# Patient Record
Sex: Female | Born: 1975 | Race: White | Hispanic: No | Marital: Married | Smoking: Never smoker
Health system: Southern US, Community
[De-identification: ages and names within clinical notes are randomized; demographics above are authoritative.]

## PROBLEM LIST (undated history)

## (undated) DIAGNOSIS — M1990 Unspecified osteoarthritis, unspecified site: Secondary | ICD-10-CM

## (undated) DIAGNOSIS — J329 Chronic sinusitis, unspecified: Secondary | ICD-10-CM

## (undated) DIAGNOSIS — G43909 Migraine, unspecified, not intractable, without status migrainosus: Secondary | ICD-10-CM

## (undated) DIAGNOSIS — Z9289 Personal history of other medical treatment: Secondary | ICD-10-CM

## (undated) DIAGNOSIS — M542 Cervicalgia: Secondary | ICD-10-CM

## (undated) DIAGNOSIS — K219 Gastro-esophageal reflux disease without esophagitis: Secondary | ICD-10-CM

## (undated) DIAGNOSIS — F329 Major depressive disorder, single episode, unspecified: Secondary | ICD-10-CM

## (undated) DIAGNOSIS — G4730 Sleep apnea, unspecified: Secondary | ICD-10-CM

## (undated) DIAGNOSIS — G8929 Other chronic pain: Secondary | ICD-10-CM

## (undated) DIAGNOSIS — M2031 Hallux varus (acquired), right foot: Secondary | ICD-10-CM

## (undated) DIAGNOSIS — R51 Headache: Secondary | ICD-10-CM

## (undated) DIAGNOSIS — F32A Depression, unspecified: Secondary | ICD-10-CM

## (undated) DIAGNOSIS — F419 Anxiety disorder, unspecified: Secondary | ICD-10-CM

## (undated) DIAGNOSIS — Z9889 Other specified postprocedural states: Secondary | ICD-10-CM

## (undated) DIAGNOSIS — M549 Dorsalgia, unspecified: Secondary | ICD-10-CM

## (undated) DIAGNOSIS — G709 Myoneural disorder, unspecified: Secondary | ICD-10-CM

## (undated) DIAGNOSIS — J189 Pneumonia, unspecified organism: Secondary | ICD-10-CM

## (undated) DIAGNOSIS — N809 Endometriosis, unspecified: Secondary | ICD-10-CM

## (undated) DIAGNOSIS — A159 Respiratory tuberculosis unspecified: Secondary | ICD-10-CM

## (undated) DIAGNOSIS — R112 Nausea with vomiting, unspecified: Secondary | ICD-10-CM

## (undated) HISTORY — DX: Migraine, unspecified, not intractable, without status migrainosus: G43.909

## (undated) HISTORY — DX: Chronic sinusitis, unspecified: J32.9

## (undated) HISTORY — DX: Other chronic pain: G89.29

## (undated) HISTORY — DX: Endometriosis, unspecified: N80.9

## (undated) HISTORY — PX: KNEE ARTHROSCOPY: SUR90

## (undated) HISTORY — PX: SLEEVE GASTROPLASTY: SHX1101

## (undated) HISTORY — PX: TYMPANOSTOMY TUBE PLACEMENT: SHX32

## (undated) HISTORY — DX: Dorsalgia, unspecified: M54.9

## (undated) HISTORY — PX: TONSILLECTOMY: SUR1361

---
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
## 1996-09-26 DIAGNOSIS — A159 Respiratory tuberculosis unspecified: Secondary | ICD-10-CM

## 1996-09-26 HISTORY — DX: Respiratory tuberculosis unspecified: A15.9

## 1998-01-21 ENCOUNTER — Emergency Department (HOSPITAL_COMMUNITY): Admission: EM | Admit: 1998-01-21 | Discharge: 1998-01-21 | Payer: Self-pay | Admitting: Emergency Medicine

## 1999-09-14 ENCOUNTER — Other Ambulatory Visit: Admission: RE | Admit: 1999-09-14 | Discharge: 1999-09-14 | Payer: Self-pay | Admitting: Obstetrics and Gynecology

## 2000-01-08 ENCOUNTER — Ambulatory Visit (HOSPITAL_COMMUNITY): Admission: RE | Admit: 2000-01-08 | Discharge: 2000-01-08 | Payer: Self-pay | Admitting: Family Medicine

## 2000-01-08 ENCOUNTER — Encounter: Payer: Self-pay | Admitting: Family Medicine

## 2000-02-14 ENCOUNTER — Ambulatory Visit (HOSPITAL_BASED_OUTPATIENT_CLINIC_OR_DEPARTMENT_OTHER): Admission: RE | Admit: 2000-02-14 | Discharge: 2000-02-14 | Payer: Self-pay | Admitting: *Deleted

## 2000-08-10 ENCOUNTER — Encounter (INDEPENDENT_AMBULATORY_CARE_PROVIDER_SITE_OTHER): Payer: Self-pay | Admitting: Specialist

## 2000-08-10 ENCOUNTER — Ambulatory Visit (HOSPITAL_BASED_OUTPATIENT_CLINIC_OR_DEPARTMENT_OTHER): Admission: RE | Admit: 2000-08-10 | Discharge: 2000-08-10 | Payer: Self-pay | Admitting: *Deleted

## 2000-09-27 ENCOUNTER — Other Ambulatory Visit: Admission: RE | Admit: 2000-09-27 | Discharge: 2000-09-27 | Payer: Self-pay | Admitting: Obstetrics and Gynecology

## 2001-09-27 ENCOUNTER — Other Ambulatory Visit: Admission: RE | Admit: 2001-09-27 | Discharge: 2001-09-27 | Payer: Self-pay | Admitting: Obstetrics and Gynecology

## 2002-05-29 ENCOUNTER — Inpatient Hospital Stay (HOSPITAL_COMMUNITY): Admission: AD | Admit: 2002-05-29 | Discharge: 2002-05-29 | Payer: Self-pay | Admitting: Obstetrics and Gynecology

## 2002-07-30 ENCOUNTER — Encounter: Payer: Self-pay | Admitting: Obstetrics and Gynecology

## 2002-07-30 ENCOUNTER — Ambulatory Visit (HOSPITAL_COMMUNITY): Admission: RE | Admit: 2002-07-30 | Discharge: 2002-07-30 | Payer: Self-pay | Admitting: Obstetrics and Gynecology

## 2002-10-14 ENCOUNTER — Encounter: Payer: Self-pay | Admitting: Obstetrics and Gynecology

## 2002-10-14 ENCOUNTER — Inpatient Hospital Stay (HOSPITAL_COMMUNITY): Admission: AD | Admit: 2002-10-14 | Discharge: 2002-10-14 | Payer: Self-pay | Admitting: Obstetrics and Gynecology

## 2002-11-08 ENCOUNTER — Encounter: Payer: Self-pay | Admitting: Obstetrics and Gynecology

## 2002-11-08 ENCOUNTER — Ambulatory Visit (HOSPITAL_COMMUNITY): Admission: RE | Admit: 2002-11-08 | Discharge: 2002-11-08 | Payer: Self-pay | Admitting: Obstetrics and Gynecology

## 2003-01-08 ENCOUNTER — Inpatient Hospital Stay (HOSPITAL_COMMUNITY): Admission: AD | Admit: 2003-01-08 | Discharge: 2003-01-08 | Payer: Self-pay | Admitting: Obstetrics and Gynecology

## 2003-01-09 ENCOUNTER — Inpatient Hospital Stay (HOSPITAL_COMMUNITY): Admission: AD | Admit: 2003-01-09 | Discharge: 2003-01-13 | Payer: Self-pay | Admitting: Obstetrics and Gynecology

## 2003-01-10 ENCOUNTER — Encounter (INDEPENDENT_AMBULATORY_CARE_PROVIDER_SITE_OTHER): Payer: Self-pay

## 2003-01-10 DIAGNOSIS — Z9289 Personal history of other medical treatment: Secondary | ICD-10-CM

## 2003-01-10 HISTORY — DX: Personal history of other medical treatment: Z92.89

## 2003-02-20 ENCOUNTER — Other Ambulatory Visit: Admission: RE | Admit: 2003-02-20 | Discharge: 2003-02-20 | Payer: Self-pay | Admitting: Obstetrics and Gynecology

## 2004-03-31 ENCOUNTER — Other Ambulatory Visit: Admission: RE | Admit: 2004-03-31 | Discharge: 2004-03-31 | Payer: Self-pay | Admitting: Obstetrics and Gynecology

## 2004-04-05 ENCOUNTER — Encounter: Admission: RE | Admit: 2004-04-05 | Discharge: 2004-04-05 | Payer: Self-pay | Admitting: Obstetrics and Gynecology

## 2004-08-06 ENCOUNTER — Ambulatory Visit (HOSPITAL_BASED_OUTPATIENT_CLINIC_OR_DEPARTMENT_OTHER): Admission: RE | Admit: 2004-08-06 | Discharge: 2004-08-06 | Payer: Self-pay | Admitting: Otolaryngology

## 2005-04-13 ENCOUNTER — Other Ambulatory Visit: Admission: RE | Admit: 2005-04-13 | Discharge: 2005-04-13 | Payer: Self-pay | Admitting: Obstetrics and Gynecology

## 2005-11-21 ENCOUNTER — Emergency Department (HOSPITAL_COMMUNITY): Admission: EM | Admit: 2005-11-21 | Discharge: 2005-11-21 | Payer: Self-pay | Admitting: Family Medicine

## 2006-09-12 ENCOUNTER — Ambulatory Visit (HOSPITAL_COMMUNITY): Admission: RE | Admit: 2006-09-12 | Discharge: 2006-09-12 | Payer: Self-pay | Admitting: Otolaryngology

## 2006-10-04 ENCOUNTER — Ambulatory Visit (HOSPITAL_COMMUNITY): Admission: RE | Admit: 2006-10-04 | Discharge: 2006-10-04 | Payer: Self-pay | Admitting: Obstetrics and Gynecology

## 2006-11-06 ENCOUNTER — Encounter: Admission: RE | Admit: 2006-11-06 | Discharge: 2006-11-06 | Payer: Self-pay | Admitting: Family Medicine

## 2007-10-12 ENCOUNTER — Ambulatory Visit (HOSPITAL_COMMUNITY): Admission: RE | Admit: 2007-10-12 | Discharge: 2007-10-12 | Payer: Self-pay | Admitting: Otolaryngology

## 2011-02-08 NOTE — Op Note (Signed)
[name], Tina Hunt NO.:  [id number]   MEDICAL RECORD NO.:  [id number]          PATIENT TYPE:  AMB   LOCATION:  SDS                          FACILITY:  [facility]   PHYSICIAN:  Lucky Cowboy, MD         DATE OF BIRTH:  December 24, 1975   DATE OF PROCEDURE:  10/12/2007  DATE OF DISCHARGE:                               OPERATIVE REPORT   PREOPERATIVE DIAGNOSIS:  Chronic bilateral otitis media.   POSTOPERATIVE DIAGNOSIS:  Chronic bilateral otitis media.   PROCEDURES:  1. Repositioning of left T-tube.  2. Removal of right middle ear T-tube.  3. Placement of new T-tube.   SURGEON:  Lucky Cowboy, MD   ANESTHESIA:  General.   ESTIMATED BLOOD LOSS:  None.   COMPLICATIONS:  None.   INDICATIONS:  The patient is a 35 year old female who has had lifelong  chronic middle ear disease.  She requires bilateral T-tubes for middle  ear aeration.  Recent examination in the office revealed the left T-tube  to be in good position.  However, it was rotated so that the lateral  lumen was against the anterior canal wall.  The right T-tube was laying  behind the tympanic membrane in the middle ear space.  For these reasons  and associated granulation tissue on the right side, the above  procedures are performed.   PROCEDURE:  The patient was taken to the operating room and placed on  the table in the supine position.  She was then placed under general  endotracheal anesthesia and a #6 ear speculum placed into the left  external auditory canal.  With the aid of the operating microscope, a an  alligator forceps was then used to reposition the pipe of the T-tube so  that it was directed in line with the ear canal.  The middle ear mucosa  can be well-seen.  Attention was then turned to the right ear.  The  existing T-tube had a small opening at the luminal lateral end there was  exposed through the tympanic membrane.  The rest of the tube was in the  middle ear space.  A right-angle hook was  used to rotate the luminal  opening anteriorly.  The pipe of the tube was then grasped with  alligator clamps and removed.  A mucoid  fluid was removed from the middle ear space.  A new Richards 1.32-mm ID  tube was then placed through the tympanic membrane and secured in place  with a pick.  Ciprodex otic was instilled.  The patient was awakened  from anesthesia and taken to the Post Anesthesia Care Unit in stable  condition.  There were no complications.      Lucky Cowboy, MD  Electronically Signed     SJ/MEDQ  D:  10/12/2007  T:  10/12/2007  Job:  [id number]   cc:   Quita Skye. Artis Flock, M.D.  [facility]

## 2011-02-11 NOTE — H&P (Signed)
Tina Hunt, Tina Hunt                       ACCOUNT NO.:  [id number]   MEDICAL RECORD NO.:  [id number]                   PATIENT TYPE:  INP   LOCATION:  9163                                 [facility]   PHYSICIAN:  Crist Fat. Rivard, M.D.              DATE OF BIRTH:  24-Apr-1976   DATE OF ADMISSION:  01/09/2003  DATE OF DISCHARGE:                                HISTORY & PHYSICAL   HISTORY OF PRESENT ILLNESS:  The patient is a 35 year old married white  female, gravida 1, para 0, at 40-5/7 weeks who presents complaining of  leaking clear fluid at around 8:15 p.m. and the onset of uterine  contractions every three to four minutes since then.  She denies any nausea,  vomiting, headaches, or visual disturbances.  Her pregnancy has been  followed at [facility] by the certified nurse midwife service,  has been essentially uncomplicated, though at risk for obesity, Rh negative,  penicillin allergy.  Group B Strep is negative.  She desires an epidural for  labor.   PAST OBSTETRICAL AND GYNECOLOGICAL HISTORY:  She is a gravida 1, para 0,  with a last menstrual period of 03/29/02, giving her an EDC of 01/03/03,  confirmed by ultrasound.  Other GYN history is noncontributory.   ALLERGIES:  PENICILLIN, it gives her hives.   PAST MEDICAL HISTORY:  1. She reports having the usual childhood diseases.  2. History of a positive PPD with a negative chest x-ray during this     pregnancy.  3. She has had occasional urinary tract infections.   PAST SURGICAL HISTORY:  1. Tonsillectomy in fourth grade.  2. Tubes in ears in first grade.  3. Left knee surgery in seventh grade.  4. Adenoidectomy in 2001.  5. She has a T-tube in her right ear now.   FAMILY HISTORY:  Significant for paternal grandfather with myocardial  infarction and heart disease and hypertension.  Mother's side of family with  varicose veins.  Maternal grandmother with anemia.  Maternal grandfather  with lung  cancer.  Maternal grandmother with breast cancer.   GENETIC HISTORY:  Negative.   SOCIAL HISTORY:  She is married to [employer] who is involved and  supportive.  They are both employed full-time.  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  They are of the [facility] faith.  They deny any illicit drug  use, alcohol, or smoking with this pregnancy.   LABORATORY DATA:  Her blood type is O negative, antibody screen is negative.  Toxoplasmosis titers are negative.  Syphilis is nonreactive.  Rubella is  immune.  Hepatitis B surface antigen is negative.  HIV is negative.  Cystic  fibrosis is negative.  Pap is within normal limits.  Her one hour Glucola  was 104.  Her Group B Strep was negative.  Gonorrhea and Chlamydia were  negative.   PHYSICAL EXAMINATION:  VITAL SIGNS:  Stable, she is afebrile.  HEENT:  Grossly within normal limits.  HEART:  Regular rate and rhythm.  CHEST:  Clear.  BREASTS:  Soft and nontender.  ABDOMEN:  Gravid with uterine contractions every three to four minutes.  The  fetal heart rate is reactive.  PELVIC:  Her cervix is 1 to 2 cm, 80%, vertex, -2, with copious amounts of  clear fluid noted.  EXTREMITIES:  Within normal limits.   ASSESSMENT:  1. Intrauterine pregnancy at 40-5/7 weeks.  2. Spontaneous rupture of membranes today at 8:15 p.m. with clear fluid.  3. Early labor.  4. Obesity.  5. Negative Group B Strep.   PLAN:  Admit to labor and delivery, follow routine CNM orders, and Dr.  Estanislado Pandy has been notified of the patient's admission.     Concha Pyo. Duplantis, C.N.M.              Crist Fat Rivard, M.D.    SJD/MEDQ  D:  01/09/2003  T:  01/10/2003  Job:  [id number]

## 2011-02-11 NOTE — Op Note (Signed)
Tina Hunt, Tina Hunt NO.:  [id number]   MEDICAL RECORD NO.:  [id number]          PATIENT TYPE:  AMB   LOCATION:  DSC                          FACILITY:  [facility]   PHYSICIAN:  Lucky Cowboy, MD         DATE OF BIRTH:  10/19/1975   DATE OF PROCEDURE:  08/06/2004  DATE OF DISCHARGE:  08/06/2004                                 OPERATIVE REPORT   PREOPERATIVE DIAGNOSIS:  Chronic otitis media.   POSTOPERATIVE DIAGNOSIS:  Chronic otitis media.   PROCEDURES:  Bilateral myringotomy with tube placement.   SURGEON:  Lucky Cowboy, M.D.   ANESTHESIA:  General mask anesthesia.   ESTIMATED BLOOD LOSS:  None.   COMPLICATIONS:  None.   INDICATIONS:  The patient is a 35 year old female who has undergone multiple  sets of tympanotomy with tube placement, including T-type tubes.  She cannot  tolerate this in the office and for this reason, she was placed under  general anesthesia.  Additionally, the patient has had conductive hearing  losses noted in the office, chronic ear pain and ear fluid.   FINDINGS:  The patient was noted to have mucoid bilateral middle ear fluid  with occluded T-type tympanotomy tubes.   DESCRIPTION OF PROCEDURE:  The patient was taken to the operating room and  placed on the table in the supine position.  She was then placed under  general endotracheal anesthesia and a #4 ear speculum placed into the right  external auditory canal.  With the aid of the operating microscope, cerumen  was removed with the curet and suction.  A myringotomy knife was used to  make an incision in the anterior inferior quadrant after removing the  existing T-type tube.  Middle ear fluid was evacuated and a 1.1 4 mm T-type  tube placed through the tympanic membrane and secured in place with a pick.  Ciprodex otic drops were instilled.  Attention was then turned to the left  ear.  In a similar fashion, the existing T-type tube was removed.  Cerumen  was debrided as well.  A  myringotomy knife was used to make an incision in  the anteroinferior quadrant.  A T-type tube with internal  diameter 1.14 mm was then placed through the tympanic membrane and secured  in place with the pick.  Ciprodex otic was instilled.  The patient was  awaken from anesthesia and taken to the postanesthesia care unit in stable  condition.  There were no complications.      [name]   SJ/MEDQ  D:  08/27/2004  T:  08/28/2004  Job:  [id number]   cc:   [facility]

## 2011-02-11 NOTE — Op Note (Signed)
Tina Hunt, KLUNK             ACCOUNT NO.:  [id number]   MEDICAL RECORD NO.:  [id number]          PATIENT TYPE:  AMB   LOCATION:  SDS                          FACILITY:  [facility]   PHYSICIAN:  Lucky Cowboy, MD         DATE OF BIRTH:  [birth year]   DATE OF PROCEDURE:  09/12/2006  DATE OF DISCHARGE:                               OPERATIVE REPORT   PREOPERATIVE DIAGNOSIS:  Chronic otitis media   POSTOPERATIVE DIAGNOSIS:  Chronic otitis media   PROCEDURE:  Bilateral myringotomy with tube placement, removal of right  middle ear tube, placement of bilateral myringotomy tubes.   SURGEON:  Lucky Cowboy, MD   ANESTHESIA:  General.   ESTIMATED BLOOD LOSS:  None.   COMPLICATIONS:  None.   INDICATIONS:  This patient is a 35 year old female with chronic otitis  media.  She has been noted to have left ear pain with a retained right  middle ear tube.  For these reasons, the above procedures are performed.   FINDINGS:  The patient was noted to have a partial tympanic membrane  perforation around the posterior marginal portion of the posteroinferior  quadrant of the left tympanic membrane.  There was no evidence of  cholesteatoma.  There was a moderate amount of middle ear mucosal edema.  The right middle ear contained a Richards T-tube in the anterosuperior  quadrant with an intact tympanic membrane.  Richards 1.32-mm ID tubes  were placed bilaterally.   PROCEDURE:  The patient was taken to the operating room and placed on  the table in the supine position.  She was then placed under general  mask anesthesia and a #4 ear speculum placed into the right external  auditory canal.  With the aid of the operating microscope, cerumen was  removed.  A myringotomy knife was used to make an incision in the  anteroinferior quadrant.  A right-angle clamp was used to grasp the T-  tube, which was then removed using alligator forceps.  A Richards T-tube  was then placed through the tympanic membrane and  secured in place with  the pick.  Ciprodex otic was instilled.  Attention was then turned to  the left ear.  In a similar fashion, cerumen was removed.  The existing  T-tube, which had cerumen debris, was removed using alligator forceps.  A myringotomy knife was used to extend the perforation toward the  anterior quadrant.  Scar bands were disrupted.  A Richards T-  tube was then placed through the tympanic membrane and secured in place  with a pick.  Ciprodex otic was instilled.  The patient was then  awakened from the anesthesia and taken to the postanesthesia care unit  in stable condition.  There were no complications.      Lucky Cowboy, MD  Electronically Signed     SJ/MEDQ  D:  09/12/2006  T:  09/13/2006  Job:  [id number]   cc:   Ladora Daniel, [facility]

## 2011-02-11 NOTE — Op Note (Signed)
NAMESERAYA, JOBST                       ACCOUNT NO.:  [id number]   MEDICAL RECORD NO.:  [id number]                   PATIENT TYPE:  INP   LOCATION:  9147                                 [facility]ILITY[facility]   PHYSICIAN:  Osborn Coho, M.D.                DATE OF BIRTH:  02/03/1976   DATE OF PROCEDURE:  01/10/2003  DATE OF DISCHARGE:                                 OPERATIVE REPORT   PREOPERATIVE DIAGNOSES:  1. Term intrauterine pregnancy.  2. Failure of descent.  3. Chorioamnionitis.   POSTOPERATIVE DIAGNOSES:  1. Term intrauterine pregnancy.  2. Failure of descent.  3. Chorioamnionitis.   PROCEDURE:  Primary low transverse cesarean section via Pfannenstiel skin  incision.   ANESTHESIA:  Epidural.   SURGEON:  Osborn Coho, M.D.   ASSISTANT:  Renaldo Reel. Emilee Hero, C.N.M.   FLUIDS REPLACED:  1900 mL.   URINE OUTPUT:  300 mL.   ESTIMATED BLOOD LOSS:  700 mL.   COMPLICATIONS:  None.   FINDINGS:  A live female infant with Apgars of 9 at one minute and 9 at five  minutes, Maxton Thomas.  Normal bilateral ovaries, normal bilateral tubes,  left tube with approximately 1 cm paratubal cyst.   DESCRIPTION OF PROCEDURE:  The patient was taken to the operating room after  the risks, benefits, and alternatives were discussed with the patient.  The  patient verbalized understanding and consent reaffirmed.  The patient was  given a surgical level via the epidural and prepped and draped in the normal  sterile fashion.  The patient was in the dorsal supine position with a  leftward tilt, and the pannus was elevated with tape.  A Pfannenstiel skin  incision was made and carried down to the underlying layer of fascia with  the Bovie.  The Bovie was used to excise the fascia bilaterally, which was  then extended with the Mayo scissors bilaterally.  Straight Kocher clamps  were placed on the inferior aspect of the fascial incision and the rectus  muscle excised from the fascia.  The  same was done on the superior aspect of  the fascial incision, and the rectus muscle was excised from the fascia.  The muscle was separated in the midline bluntly and the peritoneum entered  bluntly.  The peritoneum was manually extended.  A bladder blade was placed.  A bladder flap was created with the Metzenbaum scissors and blunt  dissection.  The uterine incision was made and extended bilaterally with the  bandage scissors.  A low transverse incision on the uterus was made.  The  infant was delivered in ROT presentation.  The infant's head upon delivery  was bulb-suctioned via the oropharynx and nasopharynx.  The remainder of the  infant was delivered and the cord was clamped and cut.  The infant was  handed off to the waiting pediatricians.  The placenta was delivered via  fundal massage.  The  uterus was cleared of all clots and debris.  The uterus  was repaired with 0 Vicryl in a running locked fashion.  A second  imbricating layer was performed.  The bladder flap was repaired with 3-0  chromic in a running fashion.  The intra-abdominal cavity was copiously  irrigated and the adnexa were identified with the findings as above.  The  peritoneum was closed with 0 chromic in a running fashion.  After the  peritoneum was closed, the muscle was also reapproximated with 2-0 chromic  using two interrupted stitches.  The fascia was repaired with 0 Vicryl in a  running fashion.  The subcutaneous tissue was irrigated and made hemostatic  with the Bovie.  A JP drain was placed.  Plain 2-0 was used to reapproximate  the subcutaneous tissue.  The skin was closed with staples.  Sponge, lap,  and needle count was correct.  The patient tolerated the procedure well and  was returned to the recovery room in stable condition.                                               Osborn Coho, M.D.    AR/MEDQ  D:  01/10/2003  T:  01/11/2003  Job:  [id number]

## 2011-02-11 NOTE — Op Note (Signed)
[place]  Patient:    Tina Hunt, Tina Hunt                    MRN: [id number] Proc. Date: 02/14/00 Adm. Date:  [id number] Disc. 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  POSTOPERATIVE DIAGNOSIS:  Otorrhagia left tympanic membrane with central perforation.  OPERATION PERFORMED:  Removal of T-tube, cautery and paper patch.  SURGEON:  Robert L. Lyman Bishop, M.D.  ANESTHESIA:  General.  INDICATIONS FOR PROCEDURE:  This patient has had chronic eustachian tube obstruction with chronic secretory otitis media.  She had had previous T-tubes placed, the one on the right has spontaneously extruded.  The drum was intact, scarred, no middle ear fluid.  On the left she had recently had bleeding from the left ear with pain and this was brought under control with Cortisporin drops and removal of a large ball of granulation tissue.  On examination she had a small residual ball of granulation tissue and a T-tube still present with a dry blood clot around it.  The patient was admitted for removal of T-tube, cautery and paper patch.  DESCRIPTION OF PROCEDURE:  After satisfactory general endotracheal anesthesia had been induced, using the operative microscope, the T-tube on the left side was removed which removed the dry blood clot along with it.  Examination showed a moderately large central perforation with well-healed margins.  Small bud of granulation tissue posteriorly that was cauterized with trichloracetic acid.  The margin of the perforation was lightly cauterized with trichloracetic acid and a paper patch saturated with Cortisporin suspension was placed over the defect and pledgets of Gelfoam also saturated with Cortisporin suspension and were placed over this.  Examination of the right ear again showed an intact  scarred atrophic drum without middle ear fluid. There was no bleeding.  The patient tolerated the procedure well, was awakened from anesthesia and taken to the recovery room in satisfactory condition. DD:  02/14/00 TD:  02/17/00 Job: [id number]

## 2011-02-11 NOTE — Op Note (Signed)
[place]  Patient:    Tina Hunt, Tina Hunt                    MRN: [id number] Proc. Date: 08/10/00 Adm. Date:  [id number] Attending:  Claudina Lick                           Operative Report  PREOPERATIVE DIAGNOSES: 1. Chronic serous otitis media. 2. Adenoid hypertrophy.  POSTOPERATIVE DIAGNOSES: 1. Chronic serous otitis media. 2. Adenoid hypertrophy.  OPERATION: 1. Bilateral T-tube myringotomies. 2. Adenoidectomy.  SURGEON:  Robert L. Lyman Bishop, M.D.  ANESTHESIA:  General.  INDICATIONS:  This 35 year old white female has had a history of chronic recurring ear infections with chronic eustachian tube dysfunction since early childhood.  The patient has had PE tube myringotomies for years.  The tubes were recently removed, so the patient could go swimming on her honeymoon, but she has subsequently developed blockage with ear middle fluid and decreased hearing.  An examination in the office in addition to scarred atrophic retracted drums with middle ear fluid on the left, showed some residual lymphoid tissue in the posterior wall of the nasopharynx, particularly on the left side around the eustachian pillar.  The patient is admitted for surgery.  DESCRIPTION OF PROCEDURE:  After satisfactory general endotracheal anesthesia had been induced, a Jennings mouth gag was inserted.  With the patients head hyperextended, the soft palate was retracted.  I could not see the full extent of the nasopharynx especially superiorly, but with palpation, the lymphoid tissue was identified and carefully removed with reverse cutting angled curet. Bleeding was controlled with a pack.  Using the operative microscope, bilateral radial myringotomy incisions were made in the anterior inferior quadrant of each ear drum.  A small amount of clear seromucoid fluid was evacuated on the left side, and modified Silastic T-tubes were inserted. Cortisporin  drops were instilled.  The pack was removed from the nasopharynx and there was no evidence of bleeding.  The estimated blood loss was 5-10 cc. The patient tolerated the procedures well, and was awakened from anesthesia, and was taken to the recovery room in satisfactory condition. DD:  08/10/00 TD:  08/10/00 Job: [id number]

## 2011-02-11 NOTE — Discharge Summary (Signed)
Tina Hunt, SCHNETZER                       ACCOUNT NO.:  [id number]   MEDICAL RECORD NO.:  [id number]                   PATIENT TYPE:  INP   LOCATION:  9147                                 [facility]   PHYSICIAN:  Tina Fat. Hunt, M.D.              DATE OF BIRTH:  [birth year]   DATE OF ADMISSION:  01/09/2003  DATE OF DISCHARGE:  01/13/2003                                 DISCHARGE SUMMARY   ADMITTING DIAGNOSES:  1. Intrauterine pregnancy at 40 and 5/7 weeks.  2. Early labor.  3. Obesity.  4. Rh negative.   DISCHARGE DIAGNOSES:  1. Term intrauterine pregnancy.  2. Failure of descent.  3. Chorioamnionitis.   HOSPITAL COURSE:  The patient is a 35 year old gravida 1, para 0 at 53 and  5/7 weeks who was admitted in early labor on the evening of January 09, 2003.  Her membranes had ruptured spontaneously.  Fluid was clear and she was 1-2  cm.  Her pregnancy had been remarkable for obesity, Rh negative, penicillin  allergy.  The patient had an epidural placed approximately 5 a.m. on the  morning of April 16.  At that time she was 3 cm, 90%.  Pitocin was begun to  augment labor.  IUPC and scalp leads were placed to facilitate tracing.  By  10 a.m. [name] were approximately 180.  Temperature maximum was 101.2.  She was begun on clindamycin and had been given a Tylenol suppository.  Fetal heart rate was reassuring.  The patient progressed to fully dilated at  approximately 12:30 p.m. at which time she began to push secondary to active  urge.  She pushed for approximately one hour and 30 minutes with vertex  remaining at 0 station.  There was slight caput beginning to form.  Fetal  heart baseline was in the 160s-170s with good variability.  Temperature was  100.5 at that time.  Tina Hunt, M.D. was consulted and the decision was  made to plan for cesarean section secondary to failure to descend.  The  patient was taken to the operating room where a primary low transverse  cesarean section was performed by Tina Hunt, M.D. under epidural  anesthesia.  Findings were a viable female by the name of Tina Hunt.  Apgars were 9 and 9.  Weight was 7 pounds 6 ounces.  There was a small left  peritubular cyst noted on the ovary.  The patient began to defervesce soon  after delivery.  She did receive one additional dose of ampicillin and  gentamicin postpartum.  By postoperative day one she was doing well.  Her  Jackson-Pratt drain was draining a small amount of fluid.  Her WBC count was  20.8 up from 18.4.  Her hemoglobin was 11.4.  Platelets were 160,000.  She  was breast-feeding and had elected to defer contraceptive decision until six  weeks.  Her Jackson-Pratt drain was discontinued on the 18th without  difficulty.  By postoperative day three she was up at lib.  She was  tolerating a regular diet.  Her incision was clean and intact, although the  area of the panniculus was slightly moist, there was no evidence of  incisional drainage.  It appeared to be more perspiration.  The staples were  intact.  The rest of the examination was within normal limits.  Infant was  doing well.  A decision was made to discharge the patient home, but to  maintain her staples for several additional days with removal in the office.  The patient was deemed to have received full benefit of her hospital stay  and was discharged home.   DISCHARGE INSTRUCTIONS:  Per [facility] handout.   DISCHARGE MEDICATIONS:  1. Motrin 600 mg p.o. q.6h. p.r.n. pain.  2. Percocet 5/325 take one p.o. q.3-4h. p.r.n. pain.  3. Prenatal vitamins one p.o. daily.   DISCHARGE FOLLOWUP:  Will occur within the week at [facility] for  staple removal and six weeks postpartum or p.r.n.     Tina Hunt, C.N.M.                   Tina Hunt, M.D.    Tina Hunt  D:  01/13/2003  T:  01/13/2003  Job:  [id number]

## 2011-06-16 LAB — HCG, SERUM, QUALITATIVE: Preg, Serum: NEGATIVE

## 2011-06-16 LAB — PROTIME-INR
INR: 0.9
Prothrombin Time: 12.8

## 2011-06-16 LAB — CBC
HCT: 39.7
Hemoglobin: 13.6
MCHC: 34.3
MCV: 92.2
Platelets: 264
RBC: 4.3
RDW: 13.3
WBC: 8.7

## 2011-06-16 LAB — BASIC METABOLIC PANEL
BUN: 11
CO2: 23
Calcium: 8.9
Chloride: 105
Creatinine, Ser: 0.6
GFR calc Af Amer: >60
GFR calc non Af Amer: >60
Glucose, Bld: 87
Potassium: 3.8
Sodium: 135

## 2011-06-16 LAB — APTT: aPTT: 30

## 2011-11-10 ENCOUNTER — Other Ambulatory Visit (HOSPITAL_COMMUNITY): Payer: Self-pay | Admitting: Otolaryngology

## 2011-11-18 ENCOUNTER — Encounter (HOSPITAL_COMMUNITY)
Admission: RE | Admit: 2011-11-18 | Discharge: 2011-11-18 | Disposition: A | Discharge status: Home / Self Care | Payer: BC Managed Care – PPO | Source: Ambulatory Visit | Attending: Otolaryngology | Admitting: Otolaryngology

## 2011-11-18 ENCOUNTER — Encounter (HOSPITAL_COMMUNITY): Payer: Self-pay | Admitting: Pharmacy Technician

## 2011-11-18 ENCOUNTER — Encounter (HOSPITAL_COMMUNITY): Payer: Self-pay

## 2011-11-18 HISTORY — DX: Anxiety disorder, unspecified: F41.9

## 2011-11-18 HISTORY — DX: Depression, unspecified: F32.A

## 2011-11-18 HISTORY — DX: Headache: R51

## 2011-11-18 HISTORY — DX: Other specified postprocedural states: R11.2

## 2011-11-18 HISTORY — DX: Major depressive disorder, single episode, unspecified: F32.9

## 2011-11-18 HISTORY — DX: Other specified postprocedural states: Z98.890

## 2011-11-18 HISTORY — DX: Sleep apnea, unspecified: G47.30

## 2011-11-18 HISTORY — DX: Myoneural disorder, unspecified: G70.9

## 2011-11-18 HISTORY — DX: Respiratory tuberculosis unspecified: A15.9

## 2011-11-18 LAB — CBC
HCT: 41.6 % (ref 36.0–46.0)
Hemoglobin: 14.4 g/dL (ref 12.0–15.0)
MCH: 31.1 pg (ref 26.0–34.0)
MCHC: 34.6 g/dL (ref 30.0–36.0)
MCV: 89.8 fL (ref 78.0–100.0)
Platelets: 277 10*3/uL (ref 150–400)
RBC: 4.63 MIL/uL (ref 3.87–5.11)
RDW: 13 % (ref 11.5–15.5)
WBC: 11.5 10*3/uL — ABNORMAL HIGH (ref 4.0–10.5)

## 2011-11-18 LAB — HCG, SERUM, QUALITATIVE: Preg, Serum: NEGATIVE

## 2011-11-18 LAB — BASIC METABOLIC PANEL
BUN: 13 mg/dL (ref 6–23)
CO2: 23 mEq/L (ref 19–32)
Calcium: 9.9 mg/dL (ref 8.4–10.5)
Chloride: 102 mEq/L (ref 96–112)
Creatinine, Ser: 0.65 mg/dL (ref 0.50–1.10)
GFR calc Af Amer: >90 mL/min (ref >90)
GFR calc non Af Amer: >90 mL/min (ref >90)
Glucose, Bld: 90 mg/dL (ref 70–99)
Potassium: 3.9 mEq/L (ref 3.5–5.1)
Sodium: 136 mEq/L (ref 135–145)

## 2011-11-18 LAB — SURGICAL PCR SCREEN
MRSA, PCR: POSITIVE — AB
Staphylococcus aureus: POSITIVE — AB

## 2011-11-18 NOTE — Pre-Procedure Instructions (Signed)
19 Tina Hunt  11/18/2011   Your procedure is scheduled on: Monday 11/28/11    Report to [facility] at 530 AM.  Call this number if you have problems the morning of surgery: [phone]   Remember:   Do not eat food:After Midnight.  May have clear liquids: up to 4 Hours before arrival.  Clear liquids include soda, tea, black coffee, apple or grape juice, broth.  Take these medicines the morning of surgery with A SIP OF WATER: PRISTIQ, ZONEGRAN    Do not wear jewelry, make-up or nail polish.  Do not wear lotions, powders, or perfumes. You may wear deodorant.  Do not shave 48 hours prior to surgery.  Do not bring valuables to the hospital.  Contacts, dentures or bridgework may not be worn into surgery.  Leave suitcase in the car. After surgery it may be brought to your room.  For patients admitted to the hospital, checkout time is 11:00 AM the day of discharge.   Patients discharged the day of surgery will not be allowed to drive home.  Name and phone number of your driver:   Special Instructions: CHG Shower Use Special Wash: 1/2 bottle night before surgery and 1/2 bottle morning of surgery.   Please read over the following fact sheets that you were given: Pain Booklet, MRSA Information and Surgical Site Infection Prevention

## 2011-11-18 NOTE — Progress Notes (Signed)
Requested sleep study [facility]  [phone].

## 2011-11-22 NOTE — Progress Notes (Signed)
Pt called x3(twice on home phone, once @work ).  Message left on VM for pt to get Mupirocin filled along w/#to contact us re: info she would need.//L. Oaklynn Stierwalt,RN

## 2011-11-25 ENCOUNTER — Other Ambulatory Visit (HOSPITAL_COMMUNITY): Payer: Self-pay | Admitting: Otolaryngology

## 2011-11-28 ENCOUNTER — Encounter (HOSPITAL_COMMUNITY): Payer: Self-pay | Admitting: Anesthesiology

## 2011-11-28 ENCOUNTER — Ambulatory Visit (HOSPITAL_COMMUNITY): Payer: BC Managed Care – PPO | Admitting: Anesthesiology

## 2011-11-28 ENCOUNTER — Ambulatory Visit (HOSPITAL_COMMUNITY)
Admission: RE | Admit: 2011-11-28 | Discharge: 2011-11-28 | Disposition: A | Discharge status: Home / Self Care | Payer: BC Managed Care – PPO | Source: Ambulatory Visit | Attending: Otolaryngology | Admitting: Otolaryngology

## 2011-11-28 ENCOUNTER — Encounter (HOSPITAL_COMMUNITY)
Admission: RE | Disposition: A | Discharge status: Home / Self Care | Payer: Self-pay | Source: Ambulatory Visit | Attending: Otolaryngology

## 2011-11-28 DIAGNOSIS — Z01812 Encounter for preprocedural laboratory examination: Secondary | ICD-10-CM | POA: Insufficient documentation

## 2011-11-28 DIAGNOSIS — H6990 Unspecified Eustachian tube disorder, unspecified ear: Secondary | ICD-10-CM | POA: Diagnosis present

## 2011-11-28 DIAGNOSIS — H6980 Other specified disorders of Eustachian tube, unspecified ear: Secondary | ICD-10-CM | POA: Diagnosis present

## 2011-11-28 SURGERY — MYRINGOTOMY WITH TUBE PLACEMENT
Anesthesia: General | Site: Ear | Laterality: Bilateral | Wound class: Clean Contaminated

## 2011-11-28 MED ORDER — FENTANYL CITRATE 0.05 MG/ML IJ SOLN
INTRAMUSCULAR | Status: DC | PRN
  Administered 2011-11-28 (×2): 50 ug via INTRAVENOUS

## 2011-11-28 MED ORDER — PROPOFOL 10 MG/ML IV EMUL
INTRAVENOUS | Status: DC | PRN
  Administered 2011-11-28: 200 mg via INTRAVENOUS
  Administered 2011-11-28: 40 mg via INTRAVENOUS

## 2011-11-28 MED ORDER — ONDANSETRON HCL 4 MG/2ML IJ SOLN: 4.0000 mg | Freq: Four times a day (QID) | INTRAMUSCULAR | Status: DC | PRN

## 2011-11-28 MED ORDER — SUCCINYLCHOLINE CHLORIDE 20 MG/ML IJ SOLN
INTRAMUSCULAR | Status: DC | PRN
  Administered 2011-11-28: 100 mg via INTRAVENOUS

## 2011-11-28 MED ORDER — FENTANYL CITRATE 0.05 MG/ML IJ SOLN
25.0000 ug | INTRAMUSCULAR | Status: DC | PRN
  Administered 2011-11-28 (×2): 25 ug via INTRAVENOUS

## 2011-11-28 MED ORDER — MIDAZOLAM HCL 5 MG/5ML IJ SOLN
INTRAMUSCULAR | Status: DC | PRN
  Administered 2011-11-28: 2 mg via INTRAVENOUS

## 2011-11-28 MED ORDER — LACTATED RINGERS IV SOLN
INTRAVENOUS | Status: DC | PRN
  Administered 2011-11-28: 07:00:00 via INTRAVENOUS

## 2011-11-28 MED ORDER — CIPROFLOXACIN-DEXAMETHASONE 0.3-0.1 % OT SUSP
OTIC | Status: DC | PRN
  Administered 2011-11-28: 4 [drp] via OTIC

## 2011-11-28 MED ORDER — ONDANSETRON HCL 4 MG/2ML IJ SOLN
INTRAMUSCULAR | Status: DC | PRN
  Administered 2011-11-28: 4 mg via INTRAVENOUS

## 2011-11-28 SURGICAL SUPPLY — 20 items
ASP/CLT FLD ANG ADJ TUBE STRL (MISCELLANEOUS)
ASPIRATOR COLLECTOR MID EAR (MISCELLANEOUS) IMPLANT
BALL CTTN LRG ABS STRL LF (GAUZE/BANDAGES/DRESSINGS) ×1
BLADE MYRINGOTOMY 6 SPEAR HDL (BLADE) ×2 IMPLANT
CANISTER SUCTION 2500CC (MISCELLANEOUS) ×1 IMPLANT
CLOTH BEACON ORANGE TIMEOUT ST (SAFETY) ×2 IMPLANT
COTTONBALL LRG STERILE PKG (GAUZE/BANDAGES/DRESSINGS) ×2 IMPLANT
COVER MAYO STAND STRL (DRAPES) ×2 IMPLANT
CRADLE DONUT ADULT HEAD (MISCELLANEOUS) IMPLANT
DRAPE PROXIMA HALF (DRAPES) ×2 IMPLANT
GLOVE ECLIPSE 7.5 STRL STRAW (GLOVE) ×2 IMPLANT
GLOVE SURG SS PI 6.5 STRL IVOR (GLOVE) ×1 IMPLANT
KIT ROOM TURNOVER OR (KITS) ×2 IMPLANT
PAD ARMBOARD 7.5X6 YLW CONV (MISCELLANEOUS) ×4 IMPLANT
SYR BULB 3OZ (MISCELLANEOUS) IMPLANT
TOWEL OR 17X24 6PK STRL BLUE (TOWEL DISPOSABLE) ×2 IMPLANT
TUBE CONNECTING 12X1/4 (SUCTIONS) ×2 IMPLANT
TUBE EAR SHEEHY BUTTON 1.27 (OTOLOGIC RELATED) IMPLANT
TUBE EAR T MOD 1.32X4.8 BL (OTOLOGIC RELATED) ×2 IMPLANT
WATER STERILE IRR 1000ML POUR (IV SOLUTION) IMPLANT

## 2011-11-28 NOTE — Anesthesia Postprocedure Evaluation (Signed)
Anesthesia Post Note  Patient: Tina Hunt  Procedure(s) Performed: Procedure(s) (LRB): MYRINGOTOMY WITH TUBE PLACEMENT (Bilateral)  Anesthesia type: General  Patient location: PACU  Post pain: Pain level controlled and Adequate analgesia  Post assessment: Post-op Vital signs reviewed, Patient's Cardiovascular Status Stable, Respiratory Function Stable, Patent Airway and Pain level controlled  Last Vitals:  Filed Vitals:   11/28/11 0845  BP: 151/85  Pulse: 74  Temp:   Resp: 22    Post vital signs: Reviewed and stable  Level of consciousness: awake, alert  and oriented  Complications: No apparent anesthesia complications

## 2011-11-28 NOTE — Anesthesia Preprocedure Evaluation (Signed)
Anesthesia Evaluation  Patient identified by MRN, date of birth, ID band Patient awake    Reviewed: Allergy & Precautions, H&P , NPO status , Patient's Chart, lab work & pertinent test results  History of Anesthesia Complications (+) PONV  Airway Mallampati: III  Neck ROM: full    Dental   Pulmonary sleep apnea ,          Cardiovascular     Neuro/Psych  Headaches, PSYCHIATRIC DISORDERS Anxiety Depression  Neuromuscular disease    GI/Hepatic   Endo/Other  Morbid obesity  Renal/GU      Musculoskeletal   Abdominal   Peds  Hematology   Anesthesia Other Findings   Reproductive/Obstetrics                           Anesthesia Physical Anesthesia Plan  ASA: II  Anesthesia Plan: General   Post-op Pain Management:    Induction: Intravenous  Airway Management Planned: Oral ETT  Additional Equipment:   Intra-op Plan:   Post-operative Plan: Extubation in OR  Informed Consent: I have reviewed the patients History and Physical, chart, labs and discussed the procedure including the risks, benefits and alternatives for the proposed anesthesia with the patient or authorized representative who has indicated his/her understanding and acceptance.     Plan Discussed with: CRNA and Surgeon  Anesthesia Plan Comments:         Anesthesia Quick Evaluation

## 2011-11-28 NOTE — Transfer of Care (Signed)
Immediate Anesthesia Transfer of Care Note  Patient: Tina Hunt  Procedure(s) Performed: Procedure(s) (LRB): MYRINGOTOMY WITH TUBE PLACEMENT (Bilateral)  Patient Location: PACU  Anesthesia Type: General  Level of Consciousness: awake, alert  and oriented  Airway & Oxygen Therapy: Patient Spontanous Breathing and Patient connected to face mask oxygen  Post-op Assessment: Report given to PACU RN  Post vital signs: Reviewed and stable  Complications: No apparent anesthesia complications

## 2011-11-28 NOTE — H&P (Signed)
Tina Hunt is an 36 y.o. female.   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  Last set of tubes was placed in 2009 and have extruded with right one falling into the middle ear.  Presents for tube replacement due to ongoing symptoms.  Will also retrieve tube from right middle ear.  Past Medical History  Diagnosis Date  . PONV (postoperative nausea and vomiting)   . Sleep apnea     DR WHITE ORDERED  CPAP   . Tuberculosis     TB + SKIN TEST   . Headache     MIGRAINES   . Neuromuscular disorder     BLACKOUT SPELLS  NONE SINCE 1996   . Anxiety   . Depression     Past Surgical History  Procedure Date  . Tonsillectomy     ADENOID X2 (SEPARATE SURGERY)  TUBE PLACEMENT  . Tympanostomy tube placement   . Knee arthroscopy     1988   . Cesarean section     2004     No family history on file. Social History:  reports that she has never smoked. She has never used smokeless tobacco. She reports that she drinks alcohol. She reports that she does not use illicit drugs.  Allergies:  Allergies  Allergen Reactions  . Penicillins Hives    No current facility-administered medications on file as of 11/28/2011.   No current outpatient prescriptions on file as of 11/28/2011.    No results found for this or any previous visit (from the past 48 hour(s)). No results found.  Review of Systems  HENT: Positive for congestion.   Neurological: Positive for headaches.  All other systems reviewed and are negative.    Blood pressure 116/73, pulse 87, temperature 98 F (36.7 C), temperature source Oral, resp. rate 20, SpO2 98.00%. Physical Exam  Constitutional: She is oriented to person, place, and time. She appears well-developed and well-nourished.  HENT:  Head: Normocephalic and atraumatic.  Right Ear: External ear normal.  Left Ear: External ear normal.  Nose: Nose normal.  Mouth/Throat: Oropharynx is clear and moist.       Right middle ear with  T-tube foreign body behind intact TM.  Eyes: Conjunctivae and EOM are normal. Pupils are equal, round, and reactive to light.  Neck: Normal range of motion. Neck supple.  Cardiovascular: Normal rate.   Respiratory: Effort normal.  GI:       Did not examine.  Genitourinary:       Did not examine.  Musculoskeletal: Normal range of motion.  Neurological: She is alert and oriented to person, place, and time. No cranial nerve deficit.  Skin: Skin is warm and dry.  Psychiatric: She has a normal mood and affect. Her behavior is normal. Judgment and thought content normal.     Assessment/Plan ETD, Right middle ear foreign body. To OR for T-tube placement in both ears and retrieval of right middle ear foreign body.  Shane Badeaux 11/28/2011, 7:34 AM

## 2011-11-28 NOTE — Op Note (Signed)
[name], Tina Hunt NO.:  [id number]  MEDICAL RECORD NO.:  [id number]  LOCATION:  [facility]                         FACILITY:  [facility]  PHYSICIAN:  Antony Contras, MD     DATE OF BIRTH:  05-Oct-1975  DATE OF PROCEDURE:  11/28/2011 DATE OF DISCHARGE:                              OPERATIVE REPORT   PREOPERATIVE DIAGNOSES: 1. Eustachian tube dysfunction. 2. Right middle ear foreign body.  POSTOPERATIVE DIAGNOSES: 1. Eustachian tube dysfunction. 2. Right middle ear foreign body.  PROCEDURE: 1. Bilateral myringotomy with T-tube placement. 2. Removal of right middle ear foreign body.  SURGEON:  Antony Contras, MD  ANESTHESIA:  General endotracheal anesthesia.  COMPLICATIONS:  None.  INDICATION:  The patient is a 36 year old white female with a long history of significant eustachian tube dysfunction having required many sets of tympanostomy tubes over the ears.  Her last set was placed in 2009, and has since extruded with the right tube falling into the middle ear space with an intact tympanic membrane over it.  She has been symptomatic with her eustachian tube dysfunction, and presents to the operating room for replacement of T-tubes and removal of the right middle ear foreign body.  She is unable to tolerate this kind of procedure awake and presents for general anesthesia.  FINDINGS:  Tympanic membranes are both intact and somewhat retracted. The left middle ear space contained a serous effusion.  The right middle ear space contained an intact T-tube and no significant effusion.  DESCRIPTION OF PROCEDURE:  The patient identified in the holding room and informed consent having been obtained including discussion of risks, benefits, alternatives, the patient was brought to the operative suite and put on the operative table in a supine position.  Anesthesia was induced, and the patient was intubated by the anesthesia team without difficulty.  The eyes were taped  closed, and the left ear was inspected under the operating microscope using ear speculum.  Cerumen was removed from the tympanic membrane using a curette.  A vertical incision was made in the anterior-inferior quadrant using myringotomy knife and middle ear resection.  A Richards modified T-tube was then placed through the myringotomy incision and unfurled into a proper position. Ciprodex drops and a cotton ball were then added.  The right ear was then inspected.  A vertical incision was made in the anterior-inferior quadrant using myringotomy knife.  An angled hook was then placed through the myringotomy incision and used to pull part of the T-tube through the myringotomy incision.  An alligator forceps was then used to remove the foreign body.  The middle ear was suctioned and a new Richards modified T-tube was then placed through the myringotomy incision and unfurled into position.  Ciprodex drops and cotton ball were added.  The patient was then returned to anesthesia for wake-up, was extubated and moved to recovery room in stable condition.     Antony Contras, MD     DDB/MEDQ  D:  11/28/2011  T:  11/28/2011  Job:  [id number]

## 2011-11-28 NOTE — Progress Notes (Signed)
Report given to maria rn as caregiver 

## 2011-11-28 NOTE — Brief Op Note (Signed)
11/28/2011  8:12 AM  PATIENT:  Tina Hunt  36 y.o. female  PRE-OPERATIVE DIAGNOSIS: Eustachian tube dysfunction, right middle ear foreign body  POST-OPERATIVE DIAGNOSIS:  Same  PROCEDURE:  Procedure(s) (LRB): MYRINGOTOMY WITH T-TUBE PLACEMENT (Bilateral) RIGHT MIDDLE EAR FOREIGN BODY REMOVAL  SURGEON:  Surgeon(s) and Role:    * Christia Reading, MD - Primary  PHYSICIAN ASSISTANT:   ASSISTANTS: none   ANESTHESIA:   general  EBL:     BLOOD ADMINISTERED:none  DRAINS: none   LOCAL MEDICATIONS USED: NONE.  SPECIMEN:  No Specimen  DISPOSITION OF SPECIMEN:  N/A  COUNTS:  YES  TOURNIQUET:  * No tourniquets in log *  DICTATION: .Other Dictation: Dictation Number [id number]  PLAN OF CARE: Discharge to home after PACU  PATIENT DISPOSITION:  PACU - hemodynamically stable.   Delay start of Pharmacological VTE agent (>24hrs) due to surgical blood loss or risk of bleeding: no

## 2011-11-28 NOTE — Preoperative (Signed)
Beta Blockers   Reason not to administer Beta Blockers:Not Applicable 

## 2012-02-01 ENCOUNTER — Other Ambulatory Visit: Payer: Self-pay | Admitting: Otolaryngology

## 2012-02-01 DIAGNOSIS — J329 Chronic sinusitis, unspecified: Secondary | ICD-10-CM

## 2012-02-02 ENCOUNTER — Ambulatory Visit
Admission: RE | Admit: 2012-02-02 | Discharge: 2012-02-02 | Disposition: A | Discharge status: Home / Self Care | Payer: BC Managed Care – PPO | Source: Ambulatory Visit | Attending: Otolaryngology | Admitting: Otolaryngology

## 2012-02-02 DIAGNOSIS — J329 Chronic sinusitis, unspecified: Secondary | ICD-10-CM

## 2012-02-06 ENCOUNTER — Other Ambulatory Visit: Payer: BC Managed Care – PPO

## 2012-12-12 ENCOUNTER — Emergency Department (HOSPITAL_COMMUNITY)
Admission: EM | Admit: 2012-12-12 | Discharge: 2012-12-12 | Disposition: A | Discharge status: Home / Self Care | Payer: BC Managed Care – PPO | Attending: Emergency Medicine | Admitting: Emergency Medicine

## 2012-12-12 ENCOUNTER — Encounter (HOSPITAL_COMMUNITY): Payer: Self-pay | Admitting: Emergency Medicine

## 2012-12-12 DIAGNOSIS — F411 Generalized anxiety disorder: Secondary | ICD-10-CM | POA: Insufficient documentation

## 2012-12-12 DIAGNOSIS — G43909 Migraine, unspecified, not intractable, without status migrainosus: Secondary | ICD-10-CM | POA: Insufficient documentation

## 2012-12-12 DIAGNOSIS — Z3202 Encounter for pregnancy test, result negative: Secondary | ICD-10-CM | POA: Insufficient documentation

## 2012-12-12 DIAGNOSIS — F329 Major depressive disorder, single episode, unspecified: Secondary | ICD-10-CM | POA: Insufficient documentation

## 2012-12-12 DIAGNOSIS — F3289 Other specified depressive episodes: Secondary | ICD-10-CM | POA: Insufficient documentation

## 2012-12-12 DIAGNOSIS — M461 Sacroiliitis, not elsewhere classified: Secondary | ICD-10-CM

## 2012-12-12 DIAGNOSIS — G4730 Sleep apnea, unspecified: Secondary | ICD-10-CM | POA: Insufficient documentation

## 2012-12-12 DIAGNOSIS — Z8611 Personal history of tuberculosis: Secondary | ICD-10-CM | POA: Insufficient documentation

## 2012-12-12 DIAGNOSIS — Z8669 Personal history of other diseases of the nervous system and sense organs: Secondary | ICD-10-CM | POA: Insufficient documentation

## 2012-12-12 DIAGNOSIS — Z79899 Other long term (current) drug therapy: Secondary | ICD-10-CM | POA: Insufficient documentation

## 2012-12-12 HISTORY — DX: Cervicalgia: M54.2

## 2012-12-12 LAB — URINALYSIS, ROUTINE W REFLEX MICROSCOPIC
Bilirubin Urine: NEGATIVE
Glucose, UA: NEGATIVE mg/dL
Ketones, ur: NEGATIVE mg/dL
Leukocytes, UA: NEGATIVE
Nitrite: NEGATIVE
Protein, ur: NEGATIVE mg/dL
Specific Gravity, Urine: 1.023 (ref 1.005–1.030)
Urobilinogen, UA: 0.2 mg/dL (ref 0.0–1.0)
pH: 5 (ref 5.0–8.0)

## 2012-12-12 LAB — URINE MICROSCOPIC-ADD ON

## 2012-12-12 LAB — POCT PREGNANCY, URINE: Preg Test, Ur: NEGATIVE

## 2012-12-12 MED ORDER — METHOCARBAMOL 500 MG PO TABS: 500.0000 mg | ORAL_TABLET | Freq: Two times a day (BID) | ORAL | Status: DC

## 2012-12-12 MED ORDER — OXYCODONE-ACETAMINOPHEN 5-325 MG PO TABS: 1.0000 | ORAL_TABLET | Freq: Four times a day (QID) | ORAL | Status: DC | PRN

## 2012-12-12 MED ORDER — OXYCODONE-ACETAMINOPHEN 5-325 MG PO TABS
1.0000 | ORAL_TABLET | Freq: Once | ORAL | Status: AC
  Administered 2012-12-12: 1 via ORAL
  Filled 2012-12-12: qty 1

## 2012-12-12 MED ORDER — METHOCARBAMOL 500 MG PO TABS
500.0000 mg | ORAL_TABLET | Freq: Once | ORAL | Status: AC
  Administered 2012-12-12: 500 mg via ORAL
  Filled 2012-12-12: qty 1

## 2012-12-12 NOTE — ED Notes (Signed)
MD at bedside. 

## 2012-12-12 NOTE — ED Provider Notes (Signed)
History    This chart was scribed for non-physician practitioner working with Tina Razor, MD by ED Scribe, Burman Nieves. This patient was seen in room TR07C/TR07C and the patient's care was started at 8:46 PM.   CSN: [id number]  Arrival date & time 12/12/12  1800   First MD Initiated Contact with Patient 12/12/12 2046      Chief Complaint  Patient presents with  . Back Pain    (Consider location/radiation/quality/duration/timing/severity/associated sxs/prior treatment) Patient is a 37 y.o. female presenting with back pain. The history is provided by the patient. No language interpreter was used.  Back Pain Location:  Thoracic spine Quality:  Stabbing Pain severity:  Moderate Onset quality:  Sudden Progression:  Unchanged Chronicity:  Recurrent Relieved by:  Nothing Worsened by:  Lying down, sitting, palpation and ambulation Associated symptoms: no abdominal pain, no fever and no headaches   Risk factors: obesity    DHRUVI CRENSHAW is a 37 y.o. female who is obese who presents to the Emergency Department complaining of moderate constant left sided back pain onset for 2 weeks. She states in the past 48 hrs pain has increasingly gotten worse in the left side of her lower back. She tried a heating pad that did not help at all when she got out of school today. Pt took some prescribed Vicodin and otc ibuprofen yesterday with no immediate relief. Pt states the pain radiates to the left side of her buttocks.Pt states that it is more comfortable to stand, sitting and standing up seems to exacerbate sx's. Pt denies any vaginal discharge, numbness, or bladder/bowel incontinence. Pt also denies fever, chills, cough, nausea, vomiting, diarrhea, SOB, weakness, and any other associated symptoms. Pt's PCP is Dr. Cliffton Asters.   Past Medical History  Diagnosis Date  . PONV (postoperative nausea and vomiting)   . Sleep apnea     DR WHITE ORDERED  CPAP   . Tuberculosis     TB + SKIN TEST   . Headache      MIGRAINES   . Neuromuscular disorder     BLACKOUT SPELLS  NONE SINCE 1996   . Anxiety   . Depression   . Neck pain     Past Surgical History  Procedure Laterality Date  . Tonsillectomy      ADENOID X2 (SEPARATE SURGERY)  TUBE PLACEMENT  . Tympanostomy tube placement    . Knee arthroscopy      1988   . Cesarean section      2004     No family history on file.  History  Substance Use Topics  . Smoking status: Never Smoker   . Smokeless tobacco: Never Used  . Alcohol Use: Yes     Comment: OCC    OB History   Grav Para Term Preterm Abortions TAB SAB Ect Mult Living                  Review of Systems  Constitutional: Negative for fever.  Gastrointestinal: Negative for abdominal pain.  Musculoskeletal: Positive for myalgias and back pain.  Neurological: Negative for headaches.  All other systems reviewed and are negative.    Allergies  Penicillins  Home Medications   Current Outpatient Rx  Name  Route  Sig  Dispense  Refill  . acetaminophen (TYLENOL) 650 MG CR tablet   Oral   Take 650 mg by mouth 2 (two) times daily as needed for pain.         Marland Kitchen ALPRAZolam (XANAX) 0.5 MG  tablet   Oral   Take 0.5 mg by mouth at bedtime as needed for sleep.          . cyclobenzaprine (FLEXERIL) 10 MG tablet   Oral   Take 10 mg by mouth at bedtime as needed for muscle spasms.          Marland Kitchen eletriptan (RELPAX) 40 MG tablet   Oral   One tablet by mouth at onset of headache. May repeat in 2 hours if headache persists or recurs.          Marland Kitchen ibuprofen (ADVIL,MOTRIN) 200 MG tablet   Oral   Take 800 mg by mouth every 6 (six) hours as needed for pain.         Marland Kitchen levofloxacin (LEVAQUIN) 750 MG tablet   Oral   Take 750 mg by mouth daily. For 7 days; Start date 12/10/12           BP 142/89  Pulse 91  Temp(Src) 98.6 F (37 C) (Oral)  Resp 18  SpO2 97%  LMP 12/05/2012  Physical Exam  Nursing note and vitals reviewed. Constitutional: She is oriented to person,  place, and time. She appears well-developed and well-nourished. No distress.  Morbidly obese  HENT:  Head: Normocephalic and atraumatic.  Eyes: EOM are normal.  Neck: Neck supple. No tracheal deviation present.  Cardiovascular: Normal rate.   Pulmonary/Chest: Effort normal. No respiratory distress.  Musculoskeletal: Normal range of motion. She exhibits tenderness.  Tenderness to left paralumbar region without overline skin changes. Has point tenderness to left mid buttocks near sacroilliac joint without overline skin changes.  No significant midline spine tenderness, crepitus or step offs no CVA tenderness.   Neurological: She is alert and oriented to person, place, and time.  Patella deep tendon reflex negative bilaterally with no foot drop  Skin: Skin is warm and dry.  Psychiatric: She has a normal mood and affect. Her behavior is normal.    ED Course  Procedures (including critical care time) DIAGNOSTIC STUDIES: Oxygen Saturation is 97% on room air, adequate by my interpretation.    COORDINATION OF CARE: 9:01 PM Discussed ED treatment with pt and pt agrees. No red flags, doubt kidney stone or UTI.  Likely sacroillitis.  RICE therapy and ortho referral as needed.      Labs Reviewed  URINALYSIS, ROUTINE W REFLEX MICROSCOPIC - Abnormal; Notable for the following:    Hgb urine dipstick SMALL (*)    All other components within normal limits  URINE MICROSCOPIC-ADD ON - Abnormal; Notable for the following:    Squamous Epithelial / LPF MANY (*)    Bacteria, UA FEW (*)    All other components within normal limits  POCT PREGNANCY, URINE   No results found.   1. Sacroiliitis    BP 142/89  Pulse 91  Temp(Src) 98.6 F (37 C) (Oral)  Resp 18  SpO2 97%  LMP 12/05/2012  I have reviewed nursing notes and vital signs.  I reviewed available ER/hospitalization records thought the EMR    MDM  I personally performed the services described in this documentation, which was scribed  in my presence. The recorded information has been reviewed and is accurate.          Fayrene Helper, PA-C 12/12/12 2126

## 2012-12-12 NOTE — ED Notes (Signed)
C/o L lower back pain x 2 weeks.  Worse over the past couple of days. Denies urinary complaints at this time but states when symptoms first started it felt like a UTI.

## 2012-12-13 NOTE — ED Provider Notes (Signed)
Medical screening examination/treatment/procedure(s) were performed by non-physician practitioner and as supervising physician I was immediately available for consultation/collaboration.  Raeford Razor, MD 12/13/12 1421

## 2014-02-17 ENCOUNTER — Other Ambulatory Visit: Payer: Self-pay | Admitting: Orthopedic Surgery

## 2014-03-07 ENCOUNTER — Inpatient Hospital Stay (HOSPITAL_COMMUNITY): Admission: RE | Admit: 2014-03-07 | Payer: BC Managed Care – PPO | Source: Ambulatory Visit

## 2014-03-10 ENCOUNTER — Ambulatory Visit (HOSPITAL_COMMUNITY)
Admission: RE | Admit: 2014-03-10 | Discharge: 2014-03-10 | Disposition: A | Discharge status: Home / Self Care | Payer: BC Managed Care – PPO | Source: Ambulatory Visit | Attending: Anesthesiology | Admitting: Anesthesiology

## 2014-03-10 ENCOUNTER — Encounter (HOSPITAL_COMMUNITY): Payer: Self-pay

## 2014-03-10 ENCOUNTER — Encounter (HOSPITAL_COMMUNITY): Payer: Self-pay | Admitting: Pharmacy Technician

## 2014-03-10 ENCOUNTER — Encounter (INDEPENDENT_AMBULATORY_CARE_PROVIDER_SITE_OTHER): Payer: Self-pay

## 2014-03-10 ENCOUNTER — Encounter (HOSPITAL_COMMUNITY)
Admission: RE | Admit: 2014-03-10 | Discharge: 2014-03-10 | Disposition: A | Discharge status: Home / Self Care | Payer: BC Managed Care – PPO | Source: Ambulatory Visit | Attending: Orthopedic Surgery | Admitting: Orthopedic Surgery

## 2014-03-10 DIAGNOSIS — Z01818 Encounter for other preprocedural examination: Secondary | ICD-10-CM | POA: Insufficient documentation

## 2014-03-10 DIAGNOSIS — Z01812 Encounter for preprocedural laboratory examination: Secondary | ICD-10-CM | POA: Insufficient documentation

## 2014-03-10 DIAGNOSIS — Z01810 Encounter for preprocedural cardiovascular examination: Secondary | ICD-10-CM | POA: Insufficient documentation

## 2014-03-10 HISTORY — DX: Unspecified osteoarthritis, unspecified site: M19.90

## 2014-03-10 HISTORY — DX: Gastro-esophageal reflux disease without esophagitis: K21.9

## 2014-03-10 HISTORY — DX: Pneumonia, unspecified organism: J18.9

## 2014-03-10 LAB — CBC
HCT: 38.9 % (ref 36.0–46.0)
Hemoglobin: 13.1 g/dL (ref 12.0–15.0)
MCH: 30.5 pg (ref 26.0–34.0)
MCHC: 33.7 g/dL (ref 30.0–36.0)
MCV: 90.5 fL (ref 78.0–100.0)
Platelets: 236 10*3/uL (ref 150–400)
RBC: 4.3 MIL/uL (ref 3.87–5.11)
RDW: 12.9 % (ref 11.5–15.5)
WBC: 8.8 10*3/uL (ref 4.0–10.5)

## 2014-03-10 LAB — BASIC METABOLIC PANEL
BUN: 13 mg/dL (ref 6–23)
CO2: 21 mEq/L (ref 19–32)
Calcium: 9.1 mg/dL (ref 8.4–10.5)
Chloride: 104 mEq/L (ref 96–112)
Creatinine, Ser: 0.73 mg/dL (ref 0.50–1.10)
GFR calc Af Amer: >90 mL/min (ref >90)
GFR calc non Af Amer: >90 mL/min (ref >90)
Glucose, Bld: 90 mg/dL (ref 70–99)
Potassium: 4.1 mEq/L (ref 3.7–5.3)
Sodium: 137 mEq/L (ref 137–147)

## 2014-03-10 LAB — SURGICAL PCR SCREEN
MRSA, PCR: NEGATIVE
Staphylococcus aureus: NEGATIVE

## 2014-03-10 LAB — HCG, SERUM, QUALITATIVE: Preg, Serum: NEGATIVE

## 2014-03-10 NOTE — Patient Instructions (Addendum)
[name]  03/10/2014   Your procedure is scheduled on: Wednesday 03/12/14  Report to [place] at 08:30 AM.  Call this number if you have problems the morning of surgery 336-: [phone]   Remember:     Do not eat food or drink liquids After Midnight.     Take these medicines the morning of surgery with A SIP OF WATER: hydrocodone if needed, topamax, xanax if needed   Do not wear jewelry, make-up or nail polish.  Do not wear lotions, powders, or perfumes. You may wear deodorant.  Do not shave 48 hours prior to surgery. Men may shave face and neck.  Do not bring valuables to the hospital.  Contacts, dentures or bridgework may not be worn into surgery.   Patients discharged the day of surgery will not be allowed to drive home.  Name and phone number of your driver: Tina Hunt [phone]   Paulette Blanch, RN  pre op nurse call if needed [phone]    [facility] - Preparing for Surgery Before surgery, you can play an important role.  Because skin is not sterile, your skin needs to be as free of germs as possible.  You can reduce the number of germs on your skin by washing with CHG (chlorahexidine gluconate) soap before surgery.  CHG is an antiseptic cleaner which kills germs and bonds with the skin to continue killing germs even after washing. Please DO NOT use if you have an allergy to CHG or antibacterial soaps.  If your skin becomes reddened/irritated stop using the CHG and inform your nurse when you arrive at Short Stay. Do not shave (including legs and underarms) for at least 48 hours prior to the first CHG shower.  You may shave your face/neck. Please follow these instructions carefully:  1.  Shower with CHG Soap the night before surgery and the  morning of Surgery.  2.  If you choose to wash your hair, wash your hair first as usual with your  normal  shampoo.  3.  After you shampoo, rinse your hair and body thoroughly to remove the  shampoo.                             4.  Use CHG as you would any other liquid soap.  You can apply chg directly  to the skin and wash                       Gently with a scrungie or clean washcloth.  5.  Apply the CHG Soap to your body ONLY FROM THE NECK DOWN.   Do not use on face/ open                           Wound or open sores. Avoid contact with eyes, ears mouth and genitals (private parts).                       Wash face,  Genitals (private parts) with your normal soap.             6.  Wash thoroughly, paying special attention to the area where your surgery  will be performed.  7.  Thoroughly rinse your body with warm water from the neck down.  8.  DO NOT shower/wash with your normal soap after using and rinsing off  the CHG Soap.                9.  Pat yourself dry with a clean towel.            10.  Wear clean pajamas.            11.  Place clean sheets on your bed the night of your first shower and do not  sleep with pets. Day of Surgery : Do not apply any lotions/deodorants the morning of surgery.  Please wear clean clothes to the hospital/surgery center.  FAILURE TO FOLLOW THESE INSTRUCTIONS MAY RESULT IN THE CANCELLATION OF YOUR SURGERY PATIENT SIGNATURE_________________________________  NURSE SIGNATURE__________________________________  ________________________________________________________________________   Tina Hunt  An incentive spirometer is a tool that can help keep your lungs clear and active. This tool measures how well you are filling your lungs with each breath. Taking long deep breaths may help reverse or decrease the chance of developing breathing (pulmonary) problems (especially infection) following:  A long period of time when you are unable to move or be active. BEFORE THE PROCEDURE   If the spirometer includes an indicator to show your best effort, your nurse or respiratory therapist will set it to a desired goal.  If possible, sit up straight or lean slightly  forward. Try not to slouch.  Hold the incentive spirometer in an upright position. INSTRUCTIONS FOR USE  1. Sit on the edge of your bed if possible, or sit up as far as you can in bed or on a chair. 2. Hold the incentive spirometer in an upright position. 3. Breathe out normally. 4. Place the mouthpiece in your mouth and seal your lips tightly around it. 5. Breathe in slowly and as deeply as possible, raising the piston or the ball toward the top of the column. 6. Hold your breath for 3-5 seconds or for as long as possible. Allow the piston or ball to fall to the bottom of the column. 7. Remove the mouthpiece from your mouth and breathe out normally. 8. Rest for a few seconds and repeat Steps 1 through 7 at least 10 times every 1-2 hours when you are awake. Take your time and take a few normal breaths between deep breaths. 9. The spirometer may include an indicator to show your best effort. Use the indicator as a goal to work toward during each repetition. 10. After each set of 10 deep breaths, practice coughing to be sure your lungs are clear. If you have an incision (the cut made at the time of surgery), support your incision when coughing by placing a pillow or rolled up towels firmly against it. Once you are able to get out of bed, walk around indoors and cough well. You may stop using the incentive spirometer when instructed by your caregiver.  RISKS AND COMPLICATIONS  Take your time so you do not get dizzy or light-headed.  If you are in pain, you may need to take or ask for pain medication before doing incentive spirometry. It is harder to take a deep breath if you are having pain. AFTER USE  Rest and breathe slowly and easily.  It can be helpful to keep track of a log of your progress. Your caregiver can provide you with a simple table to help with this. If you are using the spirometer at home, follow these instructions: [place] IF:   You are having difficultly using the  spirometer.  You have trouble using the spirometer as  often as instructed.  Your pain medication is not giving enough relief while using the spirometer.  You develop fever of 100.5 F (38.1 C) or higher. SEEK IMMEDIATE MEDICAL CARE IF:   You cough up bloody sputum that had not been present before.  You develop fever of 102 F (38.9 C) or greater.  You develop worsening pain at or near the incision site. MAKE SURE YOU:   Understand these instructions.  Will watch your condition.  Will get help right away if you are not doing well or get worse. Document Released: 01/23/2007 Document Revised: 12/05/2011 Document Reviewed: 03/26/2007 [facility] Patient Information 2014 [place], [place].   ________________________________________________________________________

## 2014-03-11 DIAGNOSIS — S83249A Other tear of medial meniscus, current injury, unspecified knee, initial encounter: Secondary | ICD-10-CM | POA: Diagnosis present

## 2014-03-11 NOTE — H&P (Signed)
CC- Tina Hunt is a 38 y.o. female who presents with left knee pain.  HPI- . Knee Pain: Patient presents with knee pain involving the  left knee. Onset of the symptoms was several months ago. Inciting event: Felt a pop in her knee when she was taking pictures on a cruise. Current symptoms include giving out, locking, pain located medially and popping sensation. Pain is aggravated by lateral movements, pivoting, rising after sitting, standing and walking.  Patient has had no prior knee problems. Evaluation to date: MRI: abnormal medial meniscal tear. Treatment to date: rest.  Past Medical History  Diagnosis Date  . PONV (postoperative nausea and vomiting)   . Tuberculosis     TB + SKIN TEST   . Headache(784.0)     MIGRAINES   . Neuromuscular disorder     BLACKOUT SPELLS  NONE SINCE 1996   . Anxiety   . Depression   . Neck pain   . Sleep apnea   . Pneumonia     hx of 3 years ago  . GERD (gastroesophageal reflux disease)     occasional  . Arthritis     "a little in Right knee"    Past Surgical History  Procedure Laterality Date  . Tympanostomy tube placement  1989, 1991, 1999, 2002, 2006, 2009, 2013  . Knee arthroscopy      1988   . Cesarean section      2004   . Adenoidectomy  1995    with tube placement  . Tonsillectomy  1983, 1985    ADENOID X2 (SEPARATE SURGERY)  TUBE PLACEMENT    Prior to Admission medications   Medication Sig Start Date End Date Taking? Authorizing Provider  ALPRAZolam Duanne Moron) 0.5 MG tablet Take 0.5 mg by mouth at bedtime as needed for sleep.     Historical Provider, MD  ciprofloxacin-dexamethasone (CIPRODEX) otic suspension Place 4 drops into both ears 4 (four) times daily as needed (Fluid in ears).    Historical Provider, MD  cyclobenzaprine (FLEXERIL) 10 MG tablet Take 10 mg by mouth 3 (three) times daily as needed for muscle spasms.     Historical Provider, MD  diclofenac sodium (VOLTAREN) 1 % GEL Apply 1 application topically daily as  needed (Knee pain).    Historical Provider, MD  eletriptan (RELPAX) 40 MG tablet One tablet by mouth at onset of headache. May repeat in 2 hours if headache persists or recurs.     Historical Provider, MD  HYDROcodone-acetaminophen (NORCO/VICODIN) 5-325 MG per tablet Take 2 tablets by mouth every 8 (eight) hours as needed for moderate pain.    Historical Provider, MD  hydrocortisone cream 1 % Apply 1 application topically as needed for itching.    Historical Provider, MD  loratadine (CLARITIN) 10 MG tablet Take 10 mg by mouth daily as needed for allergies.    Historical Provider, MD  meloxicam (MOBIC) 15 MG tablet Take 15 mg by mouth daily.    Historical Provider, MD  methocarbamol (ROBAXIN) 500 MG tablet Take 500 mg by mouth 2 (two) times daily as needed for muscle spasms.    Historical Provider, MD  topiramate (TOPAMAX) 100 MG tablet Take 50-100 mg by mouth 2 (two) times daily. 1/2 tablet in the morning and 1 tablet in the evening    Historical Provider, MD   KNEE EXAM antalgic gait, soft tissue tenderness over medial joint line, no effusion, negative drawer sign, collateral ligaments intact  Physical Examination: General appearance - alert, well appearing, and in  no distress Mental status - alert, oriented to person, place, and time Chest - clear to auscultation, no wheezes, rales or rhonchi, symmetric air entry Heart - normal rate, regular rhythm, normal S1, S2, no murmurs, rubs, clicks or gallops Abdomen - soft, nontender, nondistended, no masses or organomegaly Neurological - alert, oriented, normal speech, no focal findings or movement disorder noted   Asessment/Plan--- Left knee medial meniscal tear- - Plan left knee arthroscopy with meniscal debridement. Procedure risks and potential comps discussed with patient who elects to proceed. Goals are decreased pain and increased function with a high likelihood of achieving both

## 2014-03-12 ENCOUNTER — Ambulatory Visit (HOSPITAL_COMMUNITY): Payer: BC Managed Care – PPO | Admitting: Anesthesiology

## 2014-03-12 ENCOUNTER — Encounter (HOSPITAL_COMMUNITY)
Admission: RE | Disposition: A | Discharge status: Home / Self Care | Payer: Self-pay | Source: Ambulatory Visit | Attending: Orthopedic Surgery

## 2014-03-12 ENCOUNTER — Encounter (HOSPITAL_COMMUNITY): Payer: Self-pay | Admitting: *Deleted

## 2014-03-12 ENCOUNTER — Encounter (HOSPITAL_COMMUNITY): Payer: BC Managed Care – PPO | Admitting: Anesthesiology

## 2014-03-12 ENCOUNTER — Ambulatory Visit (HOSPITAL_COMMUNITY)
Admission: RE | Admit: 2014-03-12 | Discharge: 2014-03-12 | Disposition: A | Discharge status: Home / Self Care | Payer: BC Managed Care – PPO | Source: Ambulatory Visit | Attending: Orthopedic Surgery | Admitting: Orthopedic Surgery

## 2014-03-12 DIAGNOSIS — M23329 Other meniscus derangements, posterior horn of medial meniscus, unspecified knee: Secondary | ICD-10-CM | POA: Insufficient documentation

## 2014-03-12 DIAGNOSIS — Z8611 Personal history of tuberculosis: Secondary | ICD-10-CM | POA: Insufficient documentation

## 2014-03-12 DIAGNOSIS — IMO0002 Reserved for concepts with insufficient information to code with codable children: Secondary | ICD-10-CM

## 2014-03-12 DIAGNOSIS — F3289 Other specified depressive episodes: Secondary | ICD-10-CM | POA: Insufficient documentation

## 2014-03-12 DIAGNOSIS — Z8701 Personal history of pneumonia (recurrent): Secondary | ICD-10-CM | POA: Insufficient documentation

## 2014-03-12 DIAGNOSIS — S83249A Other tear of medial meniscus, current injury, unspecified knee, initial encounter: Secondary | ICD-10-CM | POA: Diagnosis present

## 2014-03-12 DIAGNOSIS — G43909 Migraine, unspecified, not intractable, without status migrainosus: Secondary | ICD-10-CM | POA: Insufficient documentation

## 2014-03-12 DIAGNOSIS — M1710 Unilateral primary osteoarthritis, unspecified knee: Secondary | ICD-10-CM | POA: Insufficient documentation

## 2014-03-12 DIAGNOSIS — M23302 Other meniscus derangements, unspecified lateral meniscus, unspecified knee: Secondary | ICD-10-CM | POA: Insufficient documentation

## 2014-03-12 DIAGNOSIS — F411 Generalized anxiety disorder: Secondary | ICD-10-CM | POA: Insufficient documentation

## 2014-03-12 DIAGNOSIS — Z79899 Other long term (current) drug therapy: Secondary | ICD-10-CM | POA: Insufficient documentation

## 2014-03-12 DIAGNOSIS — F329 Major depressive disorder, single episode, unspecified: Secondary | ICD-10-CM | POA: Insufficient documentation

## 2014-03-12 HISTORY — PX: KNEE ARTHROSCOPY: SHX127

## 2014-03-12 SURGERY — ARTHROSCOPY, KNEE
Anesthesia: General | Site: Knee | Laterality: Left

## 2014-03-12 MED ORDER — BUPIVACAINE-EPINEPHRINE (PF) 0.25% -1:200000 IJ SOLN
INTRAMUSCULAR | Status: AC
  Filled 2014-03-12: qty 30

## 2014-03-12 MED ORDER — PROMETHAZINE HCL 25 MG/ML IJ SOLN
INTRAMUSCULAR | Status: AC
  Filled 2014-03-12: qty 1

## 2014-03-12 MED ORDER — LACTATED RINGERS IV SOLN
INTRAVENOUS | Status: DC | PRN
  Administered 2014-03-12: 10:00:00 via INTRAVENOUS

## 2014-03-12 MED ORDER — FENTANYL CITRATE 0.05 MG/ML IJ SOLN
INTRAMUSCULAR | Status: AC
  Filled 2014-03-12: qty 2

## 2014-03-12 MED ORDER — LACTATED RINGERS IR SOLN
Status: DC | PRN
  Administered 2014-03-12: 6000 mL

## 2014-03-12 MED ORDER — SODIUM CHLORIDE 0.9 % IV SOLN: INTRAVENOUS | Status: DC

## 2014-03-12 MED ORDER — METHOCARBAMOL 500 MG PO TABS: 500.0000 mg | ORAL_TABLET | Freq: Four times a day (QID) | ORAL | Status: DC

## 2014-03-12 MED ORDER — PROPOFOL 10 MG/ML IV BOLUS
INTRAVENOUS | Status: DC | PRN
  Administered 2014-03-12: 200 mg via INTRAVENOUS

## 2014-03-12 MED ORDER — LACTATED RINGERS IV SOLN
INTRAVENOUS | Status: DC
  Administered 2014-03-12 (×2): 1000 mL via INTRAVENOUS

## 2014-03-12 MED ORDER — MIDAZOLAM HCL 5 MG/5ML IJ SOLN
INTRAMUSCULAR | Status: DC | PRN
  Administered 2014-03-12: 2 mg via INTRAVENOUS

## 2014-03-12 MED ORDER — VANCOMYCIN HCL 10 G IV SOLR
1500.0000 mg | INTRAVENOUS | Status: AC
  Administered 2014-03-12: 1000 mg via INTRAVENOUS
  Filled 2014-03-12: qty 1500

## 2014-03-12 MED ORDER — PROPOFOL 10 MG/ML IV BOLUS
INTRAVENOUS | Status: AC
  Filled 2014-03-12: qty 20

## 2014-03-12 MED ORDER — HYDROCODONE-ACETAMINOPHEN 5-325 MG PO TABS
1.0000 | ORAL_TABLET | ORAL | Status: AC | PRN
  Administered 2014-03-12 (×2): 1 via ORAL
  Filled 2014-03-12 (×2): qty 1

## 2014-03-12 MED ORDER — FENTANYL CITRATE 0.05 MG/ML IJ SOLN
INTRAMUSCULAR | Status: DC
Start: 2014-03-12 — End: 2014-03-12
  Filled 2014-03-12: qty 2

## 2014-03-12 MED ORDER — KETOROLAC TROMETHAMINE 30 MG/ML IJ SOLN
INTRAMUSCULAR | Status: AC
  Filled 2014-03-12: qty 1

## 2014-03-12 MED ORDER — ACETAMINOPHEN 10 MG/ML IV SOLN
1000.0000 mg | Freq: Once | INTRAVENOUS | Status: AC
  Administered 2014-03-12: 1000 mg via INTRAVENOUS
  Filled 2014-03-12: qty 100

## 2014-03-12 MED ORDER — LIDOCAINE HCL 1 % IJ SOLN
INTRAMUSCULAR | Status: DC | PRN
  Administered 2014-03-12: 50 mg via INTRADERMAL

## 2014-03-12 MED ORDER — HYDROCODONE-ACETAMINOPHEN 5-325 MG PO TABS: 1.0000 | ORAL_TABLET | ORAL | Status: DC | PRN

## 2014-03-12 MED ORDER — BUPIVACAINE-EPINEPHRINE 0.25% -1:200000 IJ SOLN
INTRAMUSCULAR | Status: DC | PRN
  Administered 2014-03-12: 20 mL

## 2014-03-12 MED ORDER — PROMETHAZINE HCL 25 MG/ML IJ SOLN
6.2500 mg | INTRAMUSCULAR | Status: DC | PRN
  Administered 2014-03-12: 6.25 mg via INTRAVENOUS

## 2014-03-12 MED ORDER — METOCLOPRAMIDE HCL 5 MG/ML IJ SOLN
INTRAMUSCULAR | Status: DC | PRN
  Administered 2014-03-12: 10 mg via INTRAVENOUS

## 2014-03-12 MED ORDER — LIDOCAINE HCL (CARDIAC) 20 MG/ML IV SOLN
INTRAVENOUS | Status: AC
  Filled 2014-03-12: qty 5

## 2014-03-12 MED ORDER — SODIUM CHLORIDE 0.9 % IJ SOLN
INTRAMUSCULAR | Status: AC
  Filled 2014-03-12: qty 10

## 2014-03-12 MED ORDER — ONDANSETRON HCL 4 MG/2ML IJ SOLN
INTRAMUSCULAR | Status: DC | PRN
  Administered 2014-03-12: 4 mg via INTRAVENOUS

## 2014-03-12 MED ORDER — DEXAMETHASONE SODIUM PHOSPHATE 10 MG/ML IJ SOLN
10.0000 mg | Freq: Once | INTRAMUSCULAR | Status: AC
  Administered 2014-03-12: 10 mg via INTRAVENOUS

## 2014-03-12 MED ORDER — FENTANYL CITRATE 0.05 MG/ML IJ SOLN
25.0000 ug | INTRAMUSCULAR | Status: DC | PRN
  Administered 2014-03-12 (×2): 50 ug via INTRAVENOUS

## 2014-03-12 MED ORDER — FENTANYL CITRATE 0.05 MG/ML IJ SOLN
INTRAMUSCULAR | Status: DC | PRN
  Administered 2014-03-12 (×2): 50 ug via INTRAVENOUS
  Administered 2014-03-12: 100 ug via INTRAVENOUS

## 2014-03-12 MED ORDER — KETOROLAC TROMETHAMINE 30 MG/ML IJ SOLN
15.0000 mg | Freq: Once | INTRAMUSCULAR | Status: AC | PRN
  Administered 2014-03-12: 30 mg via INTRAVENOUS

## 2014-03-12 MED ORDER — MIDAZOLAM HCL 2 MG/2ML IJ SOLN
INTRAMUSCULAR | Status: AC
  Filled 2014-03-12: qty 2

## 2014-03-12 SURGICAL SUPPLY — 26 items
BANDAGE ELASTIC 6 VELCRO ST LF (GAUZE/BANDAGES/DRESSINGS) ×1 IMPLANT
BLADE 4.2CUDA (BLADE) ×2 IMPLANT
CLOTH BEACON ORANGE TIMEOUT ST (SAFETY) ×2 IMPLANT
COUNTER NEEDLE 20 DBL MAG RED (NEEDLE) ×2 IMPLANT
CUFF TOURN SGL QUICK 34 (TOURNIQUET CUFF)
CUFF TRNQT CYL 34X4X40X1 (TOURNIQUET CUFF) ×1 IMPLANT
DRAPE U-SHAPE 47X51 STRL (DRAPES) ×2 IMPLANT
DRSG EMULSION OIL 3X3 NADH (GAUZE/BANDAGES/DRESSINGS) ×2 IMPLANT
DRSG PAD ABDOMINAL 8X10 ST (GAUZE/BANDAGES/DRESSINGS) ×1 IMPLANT
DURAPREP 26ML APPLICATOR (WOUND CARE) ×2 IMPLANT
GLOVE BIO SURGEON STRL SZ8 (GLOVE) ×2 IMPLANT
GLOVE BIOGEL PI IND STRL 8 (GLOVE) ×1 IMPLANT
GLOVE BIOGEL PI INDICATOR 8 (GLOVE) ×1
GOWN STRL REUS W/TWL LRG LVL3 (GOWN DISPOSABLE) ×2 IMPLANT
MANIFOLD NEPTUNE II (INSTRUMENTS) ×2 IMPLANT
PACK ARTHROSCOPY WL (CUSTOM PROCEDURE TRAY) ×2 IMPLANT
PACK ICE MAXI GEL EZY WRAP (MISCELLANEOUS) ×6 IMPLANT
PADDING CAST COTTON 6X4 STRL (CAST SUPPLIES) ×4 IMPLANT
POSITIONER SURGICAL ARM (MISCELLANEOUS) ×2 IMPLANT
SET ARTHROSCOPY TUBING (MISCELLANEOUS) ×2
SET ARTHROSCOPY TUBING LN (MISCELLANEOUS) ×1 IMPLANT
SPONGE GAUZE 4X4 12PLY (GAUZE/BANDAGES/DRESSINGS) ×1 IMPLANT
SUT ETHILON 4 0 PS 2 18 (SUTURE) ×2 IMPLANT
TOWEL OR 17X26 10 PK STRL BLUE (TOWEL DISPOSABLE) ×2 IMPLANT
WAND 90 DEG TURBOVAC W/CORD (SURGICAL WAND) ×1 IMPLANT
WRAP KNEE MAXI GEL POST OP (GAUZE/BANDAGES/DRESSINGS) ×2 IMPLANT

## 2014-03-12 NOTE — Progress Notes (Signed)
Amb well to BR w walker. Has access to a walker at home

## 2014-03-12 NOTE — Op Note (Signed)
Preoperative diagnosis-  Left knee medial meniscal tear  Postoperative diagnosis Left- knee medial and lateral meniscal tears   Procedure- Left knee arthroscopy with medial and lateral meniscal debridement    Surgeon- Dione Plover. Aluisio, MD  Anesthesia-General  EBL-  Minimal  Complications- None  Condition- PACU - hemodynamically stable.  Brief clinical note- -Tina Hunt is a 38 y.o.  female with a several month history of left knee pain and mechanical symptoms. Exam and history suggested medial meniscal tear confirmed by MRI. The patient presents now for arthroscopy and debridement  Procedure in detail -       After successful administration of General anesthetic, a tourmiquet is placed high on the Left  thigh and the Left lower extremity is prepped and draped in the usual sterile fashion. Time out is performed by the surgical team. Standard superomedial and inferolateral portal sites are marked and incisions made with an 11 blade. The inflow cannula is passed through the superomedial portal and camera through the inferolateral portal and inflow is initiated. Arthroscopic visualization proceeds.      The undersurface of the patella and trochlea are visualized and there is mild chondromalacia but no unstable defects. The medial and lateral gutters are visualized and there are  no loose bodies. Flexion and valgus force is applied to the knee and the medial compartment is entered. A spinal needle is passed into the joint through the site marked for the inferomedial portal. A small incision is made and the dilator passed into the joint. The findings for the medial compartment are tear at posterior horn and root of medial meniscus with displaced fragment . The tear is debrided to a stable base with baskets and a shaver and sealed off with the Arthrocare. The unstable fragment was removed. The meniscus is probed and found to be stable. The chondral surfaces have minimal chondromalacia and no  unstable defects.    The intercondylar notch is visualized and the ACL appears normal. The lateral compartment is entered and the findings are tear of the body of the lateral meniscus . The tear is debrided to a stable base with baskets and a shaver and sealed off with the Arthrocare. It is probed and found to be stable.     The joint is again inspected and there are no other tears, defects or loose bodies identified. The arthroscopic equipment is then removed from the inferior portals which are closed with interrupted 4-0 nylon. 20 ml of .25% Marcaine with epinephrine are injected through the inflow cannula and the cannula is then removed and the portal closed with nylon. The incisions are cleaned and dried and a bulky sterile dressing is applied. The patient is then awakened and transported to recovery in stable condition.   03/12/2014, 10:57 AM

## 2014-03-12 NOTE — Discharge Instructions (Signed)

## 2014-03-12 NOTE — Anesthesia Preprocedure Evaluation (Addendum)
Anesthesia Evaluation  Patient identified by MRN, date of birth, ID band Patient awake    Reviewed: Allergy & Precautions, H&P , NPO status , Patient's Chart, lab work & pertinent test results  Airway Mallampati: II TM Distance: >3 FB Neck ROM: Full    Dental no notable dental hx.    Pulmonary neg pulmonary ROS,  breath sounds clear to auscultation  + decreased breath sounds      Cardiovascular negative cardio ROS  Rhythm:Regular Rate:Normal     Neuro/Psych negative neurological ROS  negative psych ROS   GI/Hepatic negative GI ROS, Neg liver ROS,   Endo/Other  Morbid obesity  Renal/GU negative Renal ROS  negative genitourinary   Musculoskeletal negative musculoskeletal ROS (+)   Abdominal (+) + obese,   Peds negative pediatric ROS (+)  Hematology negative hematology ROS (+)   Anesthesia Other Findings   Reproductive/Obstetrics negative OB ROS                           Anesthesia Physical Anesthesia Plan  ASA: III  Anesthesia Plan: General   Post-op Pain Management:    Induction: Intravenous  Airway Management Planned: LMA  Additional Equipment:   Intra-op Plan:   Post-operative Plan: Extubation in OR  Informed Consent: I have reviewed the patients History and Physical, chart, labs and discussed the procedure including the risks, benefits and alternatives for the proposed anesthesia with the patient or authorized representative who has indicated his/her understanding and acceptance.   Dental advisory given  Plan Discussed with: CRNA and Surgeon  Anesthesia Plan Comments:        Anesthesia Quick Evaluation

## 2014-03-12 NOTE — Anesthesia Postprocedure Evaluation (Signed)
  Anesthesia Post-op Note  Patient: Tina Hunt  Procedure(s) Performed: Procedure(s) (LRB): LEFT ARTHROSCOPY KNEE WITH DEBRIDEMENT (Left)  Patient Location: PACU  Anesthesia Type: General  Level of Consciousness: awake and alert   Airway and Oxygen Therapy: Patient Spontanous Breathing  Post-op Pain: mild  Post-op Assessment: Post-op Vital signs reviewed, Patient's Cardiovascular Status Stable, Respiratory Function Stable, Patent Airway and No signs of Nausea or vomiting  Last Vitals:  Filed Vitals:   03/12/14 1200  BP:   Pulse:   Temp: 36.4 C  Resp:     Post-op Vital Signs: stable   Complications: No apparent anesthesia complications

## 2014-03-12 NOTE — Interval H&P Note (Signed)
History and Physical Interval Note:  03/12/2014 9:53 AM  Tina Hunt  has presented today for surgery, with the diagnosis of LEFT KNEE MEDIAL MENISCUS TEAR  The various methods of treatment have been discussed with the patient and family. After consideration of risks, benefits and other options for treatment, the patient has consented to  Procedure(s): LEFT ARTHROSCOPY KNEE WITH DEBRIDEMENT (Left) as a surgical intervention .  The patient's history has been reviewed, patient examined, no change in status, stable for surgery.  I have reviewed the patient's chart and labs.  Questions were answered to the patient's satisfaction.     Gearlean Alf

## 2014-03-12 NOTE — Transfer of Care (Signed)
Immediate Anesthesia Transfer of Care Note  Patient: Tina Hunt  Procedure(s) Performed: Procedure(s) with comments: LEFT ARTHROSCOPY KNEE WITH DEBRIDEMENT (Left) - medical and lateral repair menius  Patient Location: PACU  Anesthesia Type:General  Level of Consciousness: awake, alert , oriented and patient cooperative  Airway & Oxygen Therapy: Patient Spontanous Breathing and Patient connected to face mask oxygen  Post-op Assessment: Report given to PACU RN and Post -op Vital signs reviewed and stable  Post vital signs: Reviewed and stable  Complications: No apparent anesthesia complications

## 2014-03-13 ENCOUNTER — Encounter (HOSPITAL_COMMUNITY): Payer: Self-pay | Admitting: Orthopedic Surgery

## 2014-04-26 ENCOUNTER — Encounter: Payer: Self-pay | Admitting: *Deleted

## 2014-07-25 ENCOUNTER — Other Ambulatory Visit: Payer: Self-pay | Admitting: Family Medicine

## 2014-07-25 ENCOUNTER — Ambulatory Visit
Admission: RE | Admit: 2014-07-25 | Discharge: 2014-07-25 | Disposition: A | Discharge status: Home / Self Care | Payer: BC Managed Care – PPO | Source: Ambulatory Visit | Attending: Family Medicine | Admitting: Family Medicine

## 2014-07-25 DIAGNOSIS — R0600 Dyspnea, unspecified: Secondary | ICD-10-CM

## 2014-09-02 ENCOUNTER — Ambulatory Visit (INDEPENDENT_AMBULATORY_CARE_PROVIDER_SITE_OTHER): Payer: BC Managed Care – PPO | Admitting: Sports Medicine

## 2014-09-02 ENCOUNTER — Encounter: Payer: Self-pay | Admitting: Sports Medicine

## 2014-09-02 VITALS — BP 129/86 | Ht 61.0 in | Wt 285.0 lb

## 2014-09-02 DIAGNOSIS — M79671 Pain in right foot: Secondary | ICD-10-CM

## 2014-09-02 DIAGNOSIS — M722 Plantar fascial fibromatosis: Secondary | ICD-10-CM | POA: Diagnosis not present

## 2014-09-02 MED ORDER — PREDNISONE (PAK) 10 MG PO TABS: ORAL_TABLET | Freq: Every day | ORAL | Status: DC

## 2014-09-02 NOTE — Progress Notes (Signed)
   Subjective:    Patient ID: Tina Hunt, female    DOB: Feb 16, 1976, 38 y.o.   MRN: [id number]  HPI chief complaint: Right foot and ankle pain  38 year old female comes in today complaining of 2 weeks of foot and ankle pain. She initially began to experience some discomfort along the arch of the foot back in September but a couple of weeks ago began to experience intense pain diffusely around the ankle. She was seen by the athletic trainer at [employer] and diagnosed with plantar fasciitis. She was treated with a short Cam Walker and with plantar fascial taping and these initially were helpful but over the weekend she had an acute exacerbation of her symptoms which has made it difficult for her to walk. She describes an intense discomfort that is not only on the plantar aspect of the foot and heel but also along the lateral heel. Improves at rest. Does seem to be worse with first up but also has pain throughout the day. No associated numbness or tingling. She's tried some ibuprofen which has been intermittently helpful.  Past medical history reviewed. Current medications reviewed. She is status post left knee arthroscopy done earlier the she here for meniscal tear. Allergies reviewed.    Review of Systems     Objective:   Physical Exam Obese. No acute distress  Right foot: There is some tenderness to palpation at the calcaneal insertion of the plantar fascia but it is not marked. Moderate pain with calcaneal squeeze. No soft tissue swelling. Negative Tinel's over the tarsal tunnel. Neurovascularly intact distally. Walking with a limp.  MSK ultrasound of the right heel was performed. Limited images of the plantar fascia were obtained. There are hypoechoic changes throughout the plantar fascia consistent with probable plantar fascial strain. Plantar fascia measures just a little larger than 0.5cm which would suggest some possible underlying plantar fasciitis. Small heel  spur seen as well.       Assessment & Plan:  Right foot pain likely secondary to plantar fascia strain with mild underlying plantar fasciitis  I will fit her with her own short Cam Walker to wear for comfort. I will put her on a 6 day Sterapred Dosepak. She can wean from the Cam Walker to a good comfortable tennis shoe with a Sorbothane gel cup and an arch strap. She is instructed in plantar fascia stretching and will start doing daily icing. She will follow-up with me in 10 days. If symptoms persist consider further diagnostic imaging first in the form of x-rays and potentially an MRI to rule out a calcaneal stress fracture. If symptoms resolve she can cancel her follow-up appointment and follow-up when necessary.

## 2014-09-12 ENCOUNTER — Ambulatory Visit: Payer: BC Managed Care – PPO | Admitting: Sports Medicine

## 2014-12-16 ENCOUNTER — Other Ambulatory Visit: Payer: Self-pay | Admitting: *Deleted

## 2014-12-16 DIAGNOSIS — M79671 Pain in right foot: Secondary | ICD-10-CM

## 2014-12-16 MED ORDER — PREDNISONE (PAK) 10 MG PO TABS: ORAL_TABLET | Freq: Every day | ORAL | Status: DC

## 2015-05-24 IMAGING — CR DG CHEST 2V
2 series · 2 of 2 positions shown · non-contrast
Comparison: 10/09/2007

CLINICAL DATA: Preoperative evaluation for knee surgery

EXAM:
CHEST  2 VIEW

[w chest pa]
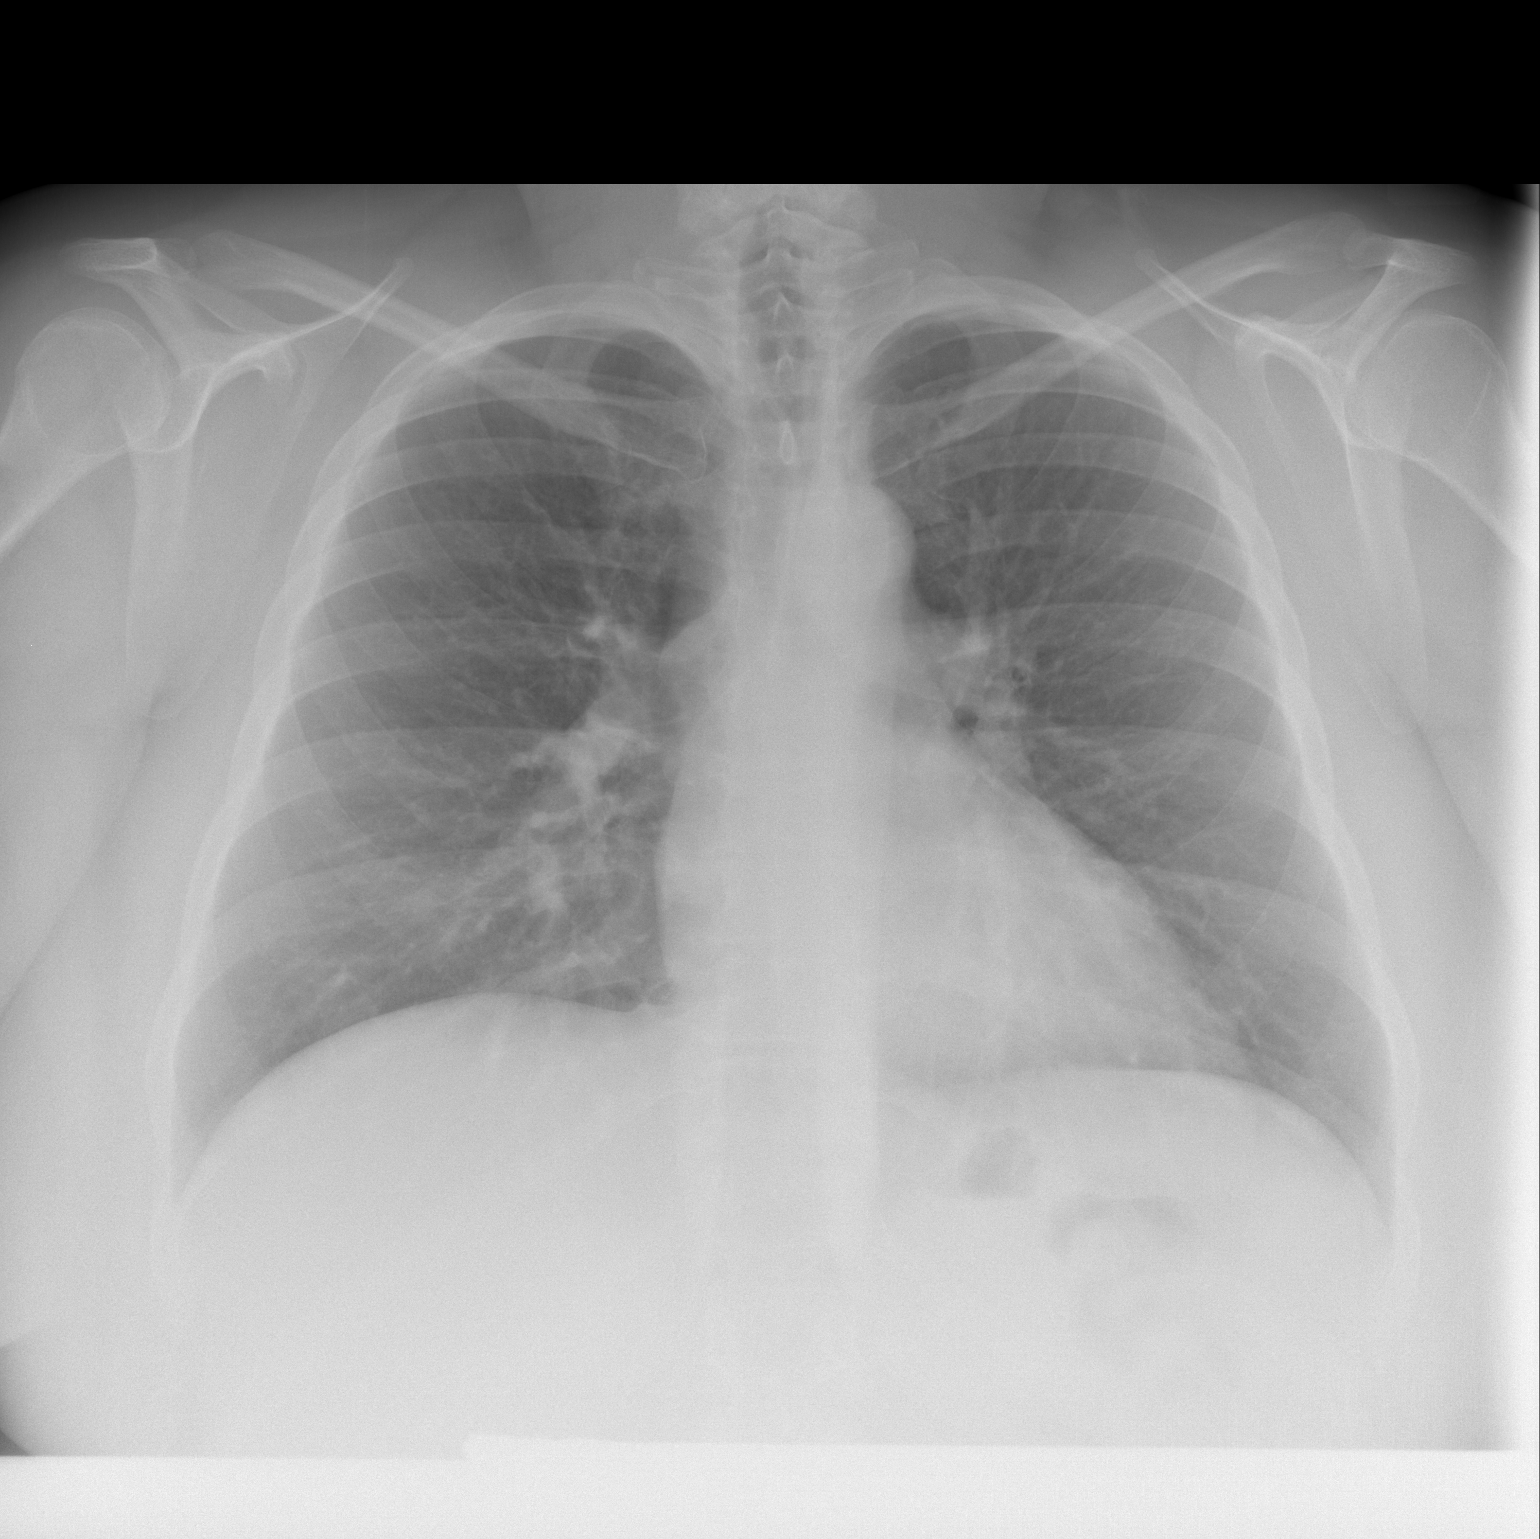

[w chest lat]
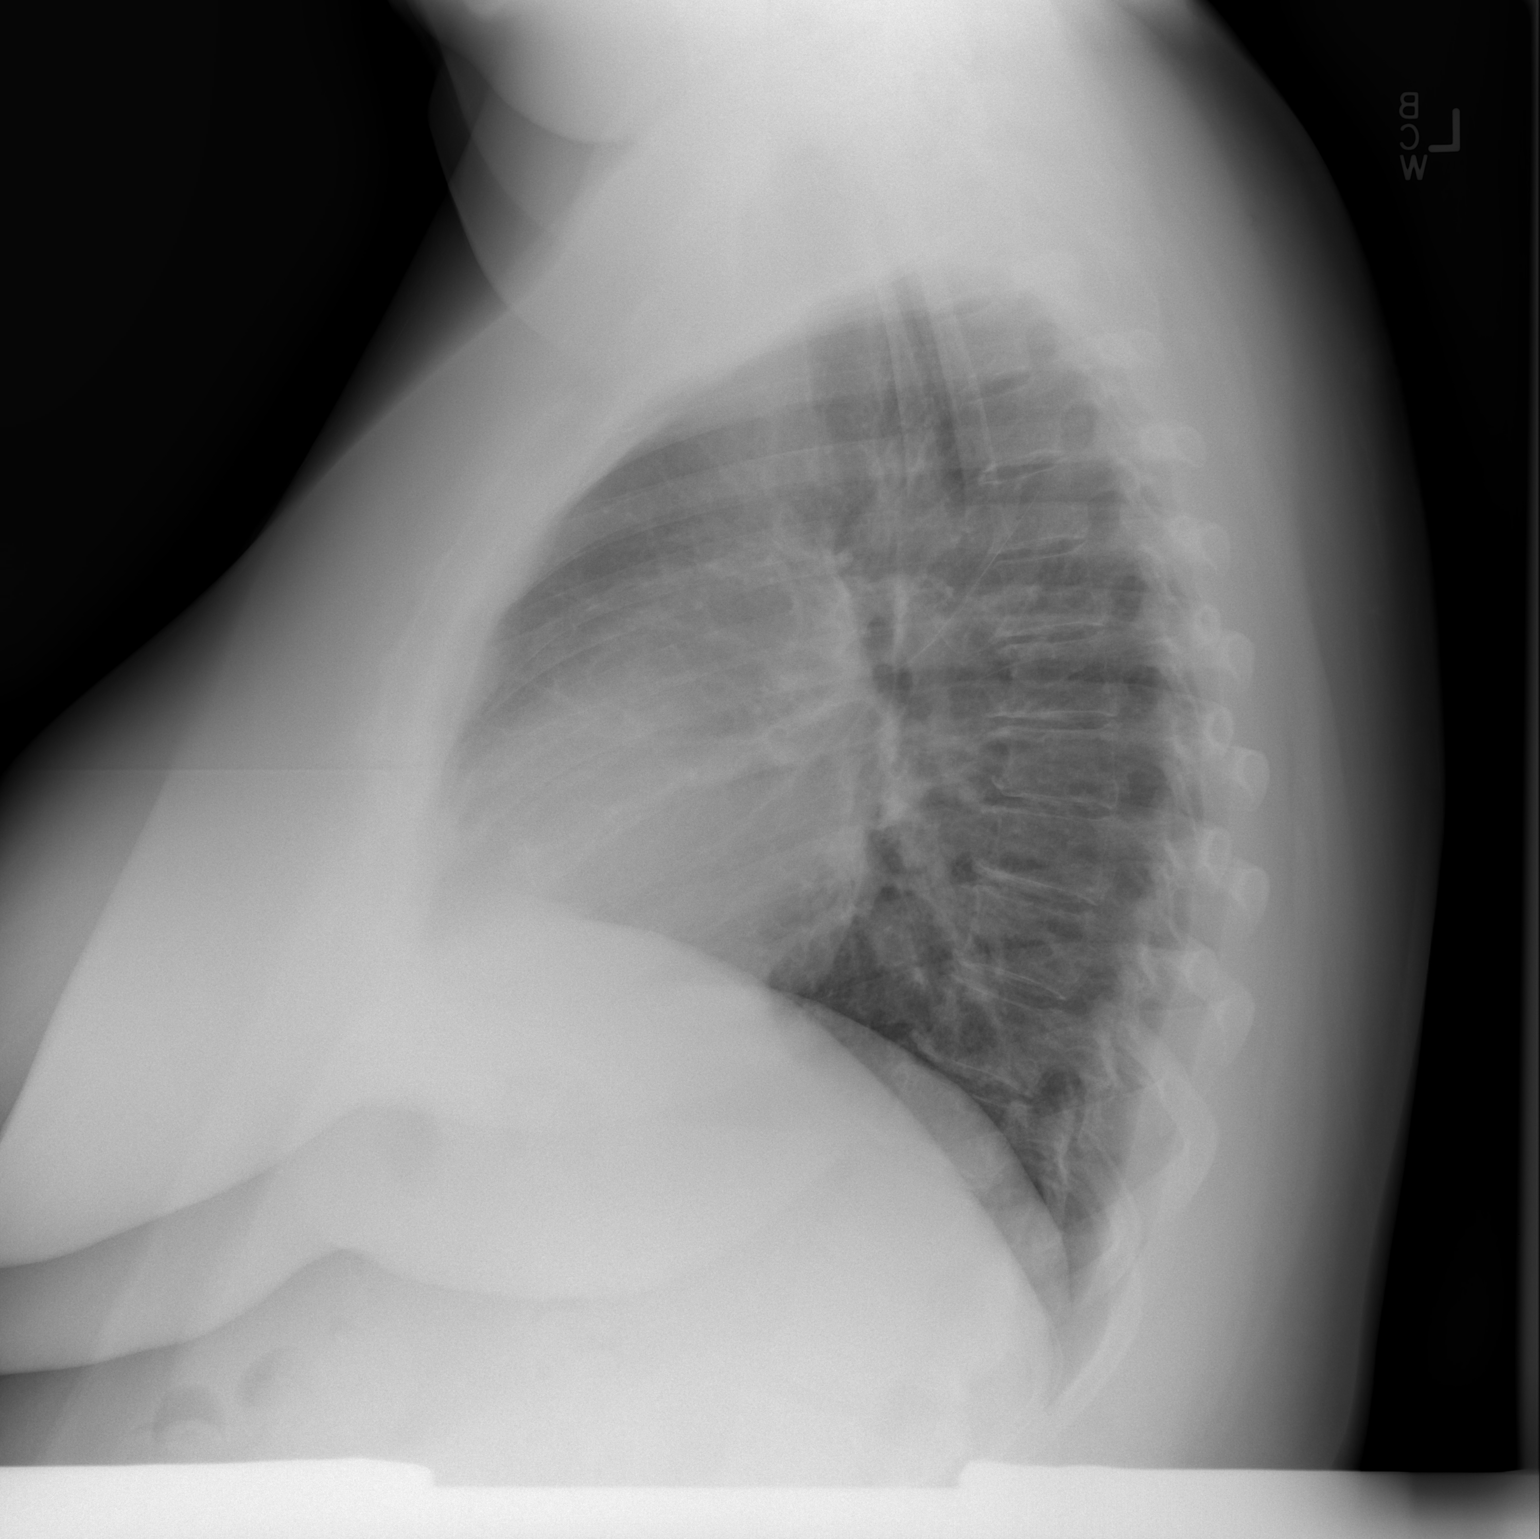

[2 of 2 positions shown; findings below may reference images not displayed]

FINDINGS: The heart size and mediastinal contours are within normal limits.
Both lungs are clear. The visualized skeletal structures are
unremarkable.
IMPRESSION: No active cardiopulmonary disease.

## 2015-05-27 ENCOUNTER — Encounter (HOSPITAL_COMMUNITY): Payer: Self-pay

## 2015-05-27 ENCOUNTER — Emergency Department (HOSPITAL_COMMUNITY): Payer: BC Managed Care – PPO

## 2015-05-27 ENCOUNTER — Emergency Department (HOSPITAL_COMMUNITY)
Admission: EM | Admit: 2015-05-27 | Discharge: 2015-05-28 | Disposition: A | Discharge status: Home / Self Care | Payer: BC Managed Care – PPO | Attending: Emergency Medicine | Admitting: Emergency Medicine

## 2015-05-27 DIAGNOSIS — N946 Dysmenorrhea, unspecified: Secondary | ICD-10-CM | POA: Diagnosis not present

## 2015-05-27 DIAGNOSIS — G43909 Migraine, unspecified, not intractable, without status migrainosus: Secondary | ICD-10-CM | POA: Diagnosis not present

## 2015-05-27 DIAGNOSIS — Z8611 Personal history of tuberculosis: Secondary | ICD-10-CM | POA: Insufficient documentation

## 2015-05-27 DIAGNOSIS — Z8701 Personal history of pneumonia (recurrent): Secondary | ICD-10-CM | POA: Insufficient documentation

## 2015-05-27 DIAGNOSIS — D270 Benign neoplasm of right ovary: Secondary | ICD-10-CM

## 2015-05-27 DIAGNOSIS — Z8719 Personal history of other diseases of the digestive system: Secondary | ICD-10-CM | POA: Insufficient documentation

## 2015-05-27 DIAGNOSIS — Z3202 Encounter for pregnancy test, result negative: Secondary | ICD-10-CM | POA: Diagnosis not present

## 2015-05-27 DIAGNOSIS — Z79899 Other long term (current) drug therapy: Secondary | ICD-10-CM | POA: Diagnosis not present

## 2015-05-27 DIAGNOSIS — R58 Hemorrhage, not elsewhere classified: Secondary | ICD-10-CM

## 2015-05-27 DIAGNOSIS — F419 Anxiety disorder, unspecified: Secondary | ICD-10-CM | POA: Insufficient documentation

## 2015-05-27 DIAGNOSIS — M1990 Unspecified osteoarthritis, unspecified site: Secondary | ICD-10-CM | POA: Diagnosis not present

## 2015-05-27 DIAGNOSIS — F329 Major depressive disorder, single episode, unspecified: Secondary | ICD-10-CM | POA: Diagnosis not present

## 2015-05-27 DIAGNOSIS — N939 Abnormal uterine and vaginal bleeding, unspecified: Secondary | ICD-10-CM | POA: Diagnosis present

## 2015-05-27 DIAGNOSIS — Z880 Allergy status to penicillin: Secondary | ICD-10-CM | POA: Diagnosis not present

## 2015-05-27 LAB — I-STAT CHEM 8, ED
BUN: 21 mg/dL — ABNORMAL HIGH (ref 6–20)
Calcium, Ion: 1.27 mmol/L — ABNORMAL HIGH (ref 1.12–1.23)
Chloride: 109 mmol/L (ref 101–111)
Creatinine, Ser: 0.8 mg/dL (ref 0.44–1.00)
Glucose, Bld: 96 mg/dL (ref 65–99)
HCT: 42 % (ref 36.0–46.0)
Hemoglobin: 14.3 g/dL (ref 12.0–15.0)
Potassium: 3.4 mmol/L — ABNORMAL LOW (ref 3.5–5.1)
Sodium: 143 mmol/L (ref 135–145)
TCO2: 18 mmol/L (ref 0–100)

## 2015-05-27 LAB — POC URINE PREG, ED: Preg Test, Ur: NEGATIVE

## 2015-05-27 LAB — WET PREP, GENITAL
Clue Cells Wet Prep HPF POC: NONE SEEN
Trich, Wet Prep: NONE SEEN
Yeast Wet Prep HPF POC: NONE SEEN

## 2015-05-27 MED ORDER — KETOROLAC TROMETHAMINE 30 MG/ML IJ SOLN
30.0000 mg | Freq: Once | INTRAMUSCULAR | Status: AC
  Administered 2015-05-27: 30 mg via INTRAVENOUS
  Filled 2015-05-27: qty 1

## 2015-05-27 MED ORDER — OXYCODONE-ACETAMINOPHEN 5-325 MG PO TABS
ORAL_TABLET | ORAL | Status: AC
  Filled 2015-05-27: qty 1

## 2015-05-27 MED ORDER — MORPHINE SULFATE (PF) 4 MG/ML IV SOLN
4.0000 mg | Freq: Once | INTRAVENOUS | Status: AC | PRN
  Administered 2015-05-28: 4 mg via INTRAVENOUS
  Filled 2015-05-27: qty 1

## 2015-05-27 MED ORDER — OXYCODONE-ACETAMINOPHEN 5-325 MG PO TABS
1.0000 | ORAL_TABLET | Freq: Once | ORAL | Status: AC
  Administered 2015-05-27: 1 via ORAL

## 2015-05-27 NOTE — ED Notes (Signed)
Family at bedside. 

## 2015-05-27 NOTE — ED Provider Notes (Signed)
CSN: [id number]     Arrival date & time 05/27/15  1920 History   First MD Initiated Contact with Patient 05/27/15 2126     Chief Complaint  Patient presents with  . Vaginal Bleeding     (Consider location/radiation/quality/duration/timing/severity/associated sxs/prior Treatment) HPI Comments: 39 year old female with a history of anxiety, depression, and migraine headaches presents to the emergency department for evaluation of heavy vaginal bleeding with lower abdominal pain. Patient reports experiencing a "contracting" like pain in her suprapubic abdomen over the last day. She reports taking ibuprofen for symptoms without relief. Patient reports heavy bleeding over the last 3 days. She states that she usually has heavy periods, but never experiences pain to this degree. Patient denies any fever, vomiting, diarrhea, melanotic or hematochezia, dysuria, or hematuria. Symptoms have slightly improved since receiving Percocet in triage. Abdominal surgical history significant for C-section. Patient is currently on Levaquin for a sinus infection.  Patient is a 39 y.o. female presenting with vaginal bleeding. The history is provided by the patient. No language interpreter was used.  Vaginal Bleeding Associated symptoms: abdominal pain (suprapubic)   Associated symptoms: no dysuria and no fever     Past Medical History  Diagnosis Date  . PONV (postoperative nausea and vomiting)   . Tuberculosis     TB + SKIN TEST   . Headache(784.0)     MIGRAINES   . Neuromuscular disorder     BLACKOUT SPELLS  NONE SINCE 1996   . Anxiety   . Depression   . Neck pain   . Pneumonia     hx of 3 years ago  . GERD (gastroesophageal reflux disease)     occasional  . Arthritis     "a little in Right knee"  . Sleep apnea     No CPAP   Past Surgical History  Procedure Laterality Date  . Tympanostomy tube placement  1989, 1991, 1999, 2002, 2006, 2009, 2013  . Knee arthroscopy      1988   . Cesarean section       2004   . Adenoidectomy  1995    with tube placement  . Tonsillectomy  1983, 1985    ADENOID X2 (SEPARATE SURGERY)  TUBE PLACEMENT  . Knee arthroscopy Left 03/12/2014    Procedure: LEFT ARTHROSCOPY KNEE WITH DEBRIDEMENT;  Surgeon: Gearlean Alf, MD;  Location: [facility];  Service: Orthopedics;  Laterality: Left;  medical and lateral repair menius   Family History  Problem Relation Age of Onset  . Diabetes Father   . Heart attack Father   . CAD Father   . Stroke Mother   . Diabetes Paternal Grandfather   . Heart attack Paternal Grandfather   . Hypertension Paternal Grandfather   . Lung cancer Maternal Grandfather    Social History  Substance Use Topics  . Smoking status: Never Smoker   . Smokeless tobacco: Never Used  . Alcohol Use: Yes     Comment: OCC   OB History    No data available      Review of Systems  Constitutional: Negative for fever.  Gastrointestinal: Positive for abdominal pain (suprapubic). Negative for vomiting and diarrhea.  Genitourinary: Positive for vaginal bleeding. Negative for dysuria and urgency.  All other systems reviewed and are negative.   Allergies  Penicillins  Home Medications   Prior to Admission medications   Medication Sig Start Date End Date Taking? Authorizing Provider  ALPRAZolam Duanne Moron) 0.5 MG tablet Take 0.5 mg by mouth at bedtime as  needed for sleep.     Historical Provider, MD  ciprofloxacin-dexamethasone (CIPRODEX) otic suspension Place 4 drops into both ears 4 (four) times daily as needed (Fluid in ears).    Historical Provider, MD  cyclobenzaprine (FLEXERIL) 10 MG tablet Take 10 mg by mouth 3 (three) times daily as needed for muscle spasms.     Historical Provider, MD  diclofenac sodium (VOLTAREN) 1 % GEL Apply 1 application topically daily as needed (Knee pain).    Historical Provider, MD  eletriptan (RELPAX) 40 MG tablet One tablet by mouth at onset of headache. May repeat in 2 hours if headache persists or recurs.      Historical Provider, MD  HYDROcodone-acetaminophen (NORCO/VICODIN) 5-325 MG per tablet Take 1-2 tablets by mouth every 4 (four) hours as needed for moderate pain. 05/28/15   Antonietta Breach, PA-C  hydrocortisone cream 1 % Apply 1 application topically as needed for itching.    Historical Provider, MD  loratadine (CLARITIN) 10 MG tablet Take 10 mg by mouth daily as needed for allergies.    Historical Provider, MD  meloxicam (MOBIC) 15 MG tablet Take 1 tablet (15 mg total) by mouth daily. 05/28/15   Antonietta Breach, PA-C  methocarbamol (ROBAXIN) 500 MG tablet Take 500 mg by mouth 2 (two) times daily as needed for muscle spasms.    Historical Provider, MD  methocarbamol (ROBAXIN) 500 MG tablet Take 1 tablet (500 mg total) by mouth 4 (four) times daily. 03/12/14   Gaynelle Arabian, MD  predniSONE (STERAPRED UNI-PAK) 10 MG tablet Take by mouth daily. Take as directed 12/16/14   Thurman Coyer, DO  topiramate (TOPAMAX) 100 MG tablet Take 50-100 mg by mouth 2 (two) times daily. 1/2 tablet in the morning and 1 tablet in the evening    Historical Provider, MD   BP 110/65 mmHg  Pulse 63  Temp(Src) 97.9 F (36.6 C) (Oral)  Resp 16  Ht 5\' 1"  (1.549 m)  Wt 292 lb 14.4 oz (132.859 kg)  BMI 55.37 kg/m2  SpO2 97%  LMP 05/22/2015   Physical Exam  Constitutional: She is oriented to person, place, and time. She appears well-developed and well-nourished. No distress.  Nontoxic/nonseptic appearing  HENT:  Head: Normocephalic and atraumatic.  Eyes: Conjunctivae and EOM are normal. No scleral icterus.  Neck: Normal range of motion.  Cardiovascular: Normal rate, regular rhythm and intact distal pulses.   Pulmonary/Chest: Effort normal. No respiratory distress.  Respirations even and unlabored  Genitourinary: There is no rash, tenderness, lesion or injury on the right labia. There is no rash, tenderness, lesion or injury on the left labia. Uterus is tender. Cervix exhibits no motion tenderness and no friability. Right  adnexum displays tenderness. Right adnexum displays no mass and no fullness. Left adnexum displays no mass, no tenderness and no fullness. There is bleeding (c/w menses) in the vagina. No vaginal discharge found.  Musculoskeletal: Normal range of motion.  Neurological: She is alert and oriented to person, place, and time. She exhibits normal muscle tone. Coordination normal.  Skin: Skin is warm and dry. No rash noted. She is not diaphoretic. No erythema. No pallor.  Psychiatric: She has a normal mood and affect. Her behavior is normal.  Nursing note and vitals reviewed.   ED Course  Procedures (including critical care time) Labs Review Labs Reviewed  WET PREP, GENITAL - Abnormal; Notable for the following:    WBC, Wet Prep HPF POC FEW (*)    All other components within normal limits  I-STAT  CHEM 8, ED - Abnormal; Notable for the following:    Potassium 3.4 (*)    BUN 21 (*)    Calcium, Ion 1.27 (*)    All other components within normal limits  POC URINE PREG, ED  GC/CHLAMYDIA PROBE AMP ([place]) NOT AT [facility]    Imaging Review US Transvaginal Non-ob  05/28/2015   CLINICAL DATA:  Acute onset of severe pelvic cramping and heavy vaginal bleeding. Initial encounter.  EXAM: TRANSABDOMINAL AND TRANSVAGINAL ULTRASOUND OF PELVIS  TECHNIQUE: Both transabdominal and transvaginal ultrasound examinations of the pelvis were performed. Transabdominal technique was performed for global imaging of the pelvis including uterus, ovaries, adnexal regions, and pelvic cul-de-sac. It was necessary to proceed with endovaginal exam following the transabdominal exam to visualize the uterus and ovaries in greater detail.  COMPARISON:  None  FINDINGS: Uterus  Measurements: 10.0 x 4.7 x 5.5 cm. No fibroids visualized. Multiple large nabothian cysts are seen at the cervix.  Endometrium  Thickness: 0.5 cm.  No focal abnormality visualized.  Right ovary  Measurements: 3.9 x 3.3 x 1.8 cm. There is a lobulated mildly  hyperechoic mass within the right ovary, measuring 2.4 x 1.9 x 1.7 cm. This could reflect a dermoid cyst, though MRI would be helpful for further evaluation.  Left ovary  Measurements: 2.3 x 2.1 x 1.5 cm. Normal appearance/no adnexal mass. No definite associated blood flow is seen on limited color Doppler evaluation, though no full Doppler evaluation was performed, and there are no ancillary signs to suggest torsion.  Other findings  No free fluid is seen within the pelvic cul-de-sac.  IMPRESSION: 1. Lobulated mildly hyperechoic mass within the right ovary, measuring 2.4 x 1.9 x 1.7 cm. This could reflect a dermoid cyst, though MRI of the pelvis would be helpful for further evaluation, when and as deemed clinically appropriate. 2. No findings seen to explain the patient's heavy vaginal bleeding. 3. Multiple nabothian cysts seen at the cervix. 4. Ovaries difficult to fully assess. No definite blood flow seen at the left ovary on limited color Doppler evaluation, though there are no ancillary signs to suggest torsion, and a full Doppler evaluation was not performed.   Electronically Signed   By: Garald Balding M.D.   On: 05/28/2015 01:27   US Pelvis Complete  05/28/2015   CLINICAL DATA:  Acute onset of severe pelvic cramping and heavy vaginal bleeding. Initial encounter.  EXAM: TRANSABDOMINAL AND TRANSVAGINAL ULTRASOUND OF PELVIS  TECHNIQUE: Both transabdominal and transvaginal ultrasound examinations of the pelvis were performed. Transabdominal technique was performed for global imaging of the pelvis including uterus, ovaries, adnexal regions, and pelvic cul-de-sac. It was necessary to proceed with endovaginal exam following the transabdominal exam to visualize the uterus and ovaries in greater detail.  COMPARISON:  None  FINDINGS: Uterus  Measurements: 10.0 x 4.7 x 5.5 cm. No fibroids visualized. Multiple large nabothian cysts are seen at the cervix.  Endometrium  Thickness: 0.5 cm.  No focal abnormality  visualized.  Right ovary  Measurements: 3.9 x 3.3 x 1.8 cm. There is a lobulated mildly hyperechoic mass within the right ovary, measuring 2.4 x 1.9 x 1.7 cm. This could reflect a dermoid cyst, though MRI would be helpful for further evaluation.  Left ovary  Measurements: 2.3 x 2.1 x 1.5 cm. Normal appearance/no adnexal mass. No definite associated blood flow is seen on limited color Doppler evaluation, though no full Doppler evaluation was performed, and there are no ancillary signs to suggest torsion.  Other  findings  No free fluid is seen within the pelvic cul-de-sac.  IMPRESSION: 1. Lobulated mildly hyperechoic mass within the right ovary, measuring 2.4 x 1.9 x 1.7 cm. This could reflect a dermoid cyst, though MRI of the pelvis would be helpful for further evaluation, when and as deemed clinically appropriate. 2. No findings seen to explain the patient's heavy vaginal bleeding. 3. Multiple nabothian cysts seen at the cervix. 4. Ovaries difficult to fully assess. No definite blood flow seen at the left ovary on limited color Doppler evaluation, though there are no ancillary signs to suggest torsion, and a full Doppler evaluation was not performed.   Electronically Signed   By: Garald Balding M.D.   On: 05/28/2015 01:27   I have personally reviewed and evaluated these images and lab results as part of my medical decision-making.   EKG Interpretation None      Medications  oxyCODONE-acetaminophen (PERCOCET/ROXICET) 5-325 MG per tablet 1 tablet (1 tablet Oral Given 05/27/15 1946)  ketorolac (TORADOL) 30 MG/ML injection 30 mg (30 mg Intravenous Given 05/27/15 2239)  morphine 4 MG/ML injection 4 mg (4 mg Intravenous Given 05/28/15 0007)  oxyCODONE-acetaminophen (PERCOCET/ROXICET) 5-325 MG per tablet 2 tablet (2 tablets Oral Given 05/28/15 0148)    MDM   Final diagnoses:  Dysmenorrhea  Dermoid cyst of ovary, right    39 year old female presents to the emergency department for complaints of lower  abdominal pain associated with menses. Symptoms consistent with dysmenorrhea. H/H stable. No fever. No peritoneal signs on exam. Patient with evidence of dermoid cyst on R ovary. Patient has no focal adnexal TTP on the left. Doubt TOA or torsion.  Pain treated in ED with toradol, morphine, and percocet. Patient reports a history of similar pain with each menstrual cycle, but states it just has not been as bad as today for a few years. No indication for further emergent workup. Patient advised to follow-up with her OB/GYN for further evaluation of symptoms, especially if pain persists. Return precautions given at discharge. Patient discharged in good condition with no unaddressed concerns.   Filed Vitals:   05/28/15 0115 05/28/15 0130 05/28/15 0145 05/28/15 0200  BP: 125/62 112/69 114/59 101/56  Pulse: 71 72 74 73  Temp:      TempSrc:      Resp:      Height:      Weight:      SpO2: 95% 96% 96% 96%      Antonietta Breach, PA-C 06/05/15 0056  Varney Biles, MD 06/05/15 0155

## 2015-05-27 NOTE — ED Notes (Signed)
Patient transported to Ultrasound 

## 2015-05-27 NOTE — ED Notes (Signed)
Pt states she usually has heavy periods but today she felt like her uterus was contracting. Went to the bathroom and removed her tampon and had to reinsert another one because she didn't have anything else. Hurt to try to get up earlier. 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

## 2015-05-28 LAB — GC/CHLAMYDIA PROBE AMP (~~LOC~~) NOT AT ARMC
Chlamydia: NEGATIVE
Neisseria Gonorrhea: NEGATIVE

## 2015-05-28 MED ORDER — MELOXICAM 15 MG PO TABS: 15.0000 mg | ORAL_TABLET | Freq: Every day | ORAL | Status: DC

## 2015-05-28 MED ORDER — HYDROCODONE-ACETAMINOPHEN 5-325 MG PO TABS: 1.0000 | ORAL_TABLET | ORAL | Status: DC | PRN

## 2015-05-28 MED ORDER — OXYCODONE-ACETAMINOPHEN 5-325 MG PO TABS
2.0000 | ORAL_TABLET | Freq: Once | ORAL | Status: AC
  Administered 2015-05-28: 2 via ORAL
  Filled 2015-05-28: qty 2

## 2015-05-28 NOTE — Discharge Instructions (Signed)
Dysmenorrhea Dysmenorrhea is pain during a menstrual period. You will have pain in the lower belly (abdomen). The pain is caused by the tightening (contracting) of the muscles of the uterus. The pain can be minor or severe. Headache, feeling sick to your stomach (nausea), throwing up (vomiting), or low back pain may occur with this condition. HOME CARE  Only take medicine as told by your doctor.  Place a heating pad or hot water bottle on your lower back or belly. Do not sleep with a heating pad.  Exercise may help lessen the pain.  Massage the lower back or belly.  Stop smoking.  Avoid alcohol and caffeine. GET HELP IF:   Your pain does not get better with medicine.  You have pain during sex.  Your pain gets worse while taking pain medicine.  Your period bleeding is heavier than normal.  You keep feeling sick to your stomach or keep throwing up. GET HELP RIGHT AWAY IF: You pass out (faint). Document Released: 12/09/2008 Document Revised: 09/17/2013 Document Reviewed: 02/28/2013 [facility] Patient Information 2015 [place], [place]. This information is not intended to replace advice given to you by your health care provider. Make sure you discuss any questions you have with your health care provider.  Ovarian Cyst An ovarian cyst is a fluid-filled sac that forms on an ovary. The ovaries are small organs that produce eggs in women. Various types of cysts can form on the ovaries. Most are not cancerous. Many do not cause problems, and they often go away on their own. Some may cause symptoms and require treatment. Common types of ovarian cysts include:  Functional cysts--These cysts may occur every month during the menstrual cycle. This is normal. The cysts usually go away with the next menstrual cycle if the woman does not get pregnant. Usually, there are no symptoms with a functional cyst.  Endometrioma cysts--These cysts form from the tissue that lines the uterus. They are also called  "chocolate cysts" because they become filled with blood that turns brown. This type of cyst can cause pain in the lower abdomen during intercourse and with your menstrual period.  Cystadenoma cysts--This type develops from the cells on the outside of the ovary. These cysts can get very big and cause lower abdomen pain and pain with intercourse. This type of cyst can twist on itself, cut off its blood supply, and cause severe pain. It can also easily rupture and cause a lot of pain.  Dermoid cysts--This type of cyst is sometimes found in both ovaries. These cysts may contain different kinds of body tissue, such as skin, teeth, hair, or cartilage. They usually do not cause symptoms unless they get very big.  Theca lutein cysts--These cysts occur when too much of a certain hormone (human chorionic gonadotropin) is produced and overstimulates the ovaries to produce an egg. This is most common after procedures used to assist with the conception of a baby (in vitro fertilization). CAUSES   Fertility drugs can cause a condition in which multiple large cysts are formed on the ovaries. This is called ovarian hyperstimulation syndrome.  A condition called polycystic ovary syndrome can cause hormonal imbalances that can lead to nonfunctional ovarian cysts. SIGNS AND SYMPTOMS  Many ovarian cysts do not cause symptoms. If symptoms are present, they may include:  Pelvic pain or pressure.  Pain in the lower abdomen.  Pain during sexual intercourse.  Increasing girth (swelling) of the abdomen.  Abnormal menstrual periods.  Increasing pain with menstrual periods.  Stopping  having menstrual periods without being pregnant. DIAGNOSIS  These cysts are commonly found during a routine or annual pelvic exam. Tests may be ordered to find out more about the cyst. These tests may include:  Ultrasound.  X-ray of the pelvis.  CT scan.  MRI.  Blood tests. TREATMENT  Many ovarian cysts go away on their own  without treatment. Your health care provider may want to check your cyst regularly for 2-3 months to see if it changes. For women in menopause, it is particularly important to monitor a cyst closely because of the higher rate of ovarian cancer in menopausal women. When treatment is needed, it may include any of the following:  A procedure to drain the cyst (aspiration). This may be done using a long needle and ultrasound. It can also be done through a laparoscopic procedure. This involves using a thin, lighted tube with a tiny camera on the end (laparoscope) inserted through a small incision.  Surgery to remove the whole cyst. This may be done using laparoscopic surgery or an open surgery involving a larger incision in the lower abdomen.  Hormone treatment or birth control pills. These methods are sometimes used to help dissolve a cyst. HOME CARE INSTRUCTIONS   Only take over-the-counter or prescription medicines as directed by your health care provider.  Follow up with your health care provider as directed.  Get regular pelvic exams and Pap tests. SEEK MEDICAL CARE IF:   Your periods are late, irregular, or painful, or they stop.  Your pelvic pain or abdominal pain does not go away.  Your abdomen becomes larger or swollen.  You have pressure on your bladder or trouble emptying your bladder completely.  You have pain during sexual intercourse.  You have feelings of fullness, pressure, or discomfort in your stomach.  You lose weight for no apparent reason.  You feel generally ill.  You become constipated.  You lose your appetite.  You develop acne.  You have an increase in body and facial hair.  You are gaining weight, without changing your exercise and eating habits.  You think you are pregnant. SEEK IMMEDIATE MEDICAL CARE IF:   You have increasing abdominal pain.  You feel sick to your stomach (nauseous), and you throw up (vomit).  You develop a fever that comes on  suddenly.  You have abdominal pain during a bowel movement.  Your menstrual periods become heavier than usual. MAKE SURE YOU:  Understand these instructions.  Will watch your condition.  Will get help right away if you are not doing well or get worse. Document Released: 09/12/2005 Document Revised: 09/17/2013 Document Reviewed: 05/20/2013 [facility] Patient Information 2015 [place], [place]. This information is not intended to replace advice given to you by your health care provider. Make sure you discuss any questions you have with your health care provider.

## 2015-05-28 NOTE — ED Notes (Signed)
Patient is alert and orientedx4.  Patient was explained discharge instructions and they understood them with no questions.   

## 2015-09-14 ENCOUNTER — Other Ambulatory Visit: Payer: Self-pay | Admitting: Obstetrics and Gynecology

## 2015-09-28 ENCOUNTER — Other Ambulatory Visit (HOSPITAL_COMMUNITY): Payer: Self-pay | Admitting: Obstetrics and Gynecology

## 2015-09-28 NOTE — H&P (Signed)
Tina Hunt is a 40 y.o.  female P: 1-0-0-1 who presents for laparoscopic ovarian cystectomy and hysteroscopy D & C because  of pelvic pain, persistent ovarian cyst and menorrhagia. The patient gives a history of always having a heavy menstrual period and only began oral contraceptives as an adult to manage it.   For the past year her period has become progressively worse, though it only lasts for 3 days.  Since August she has flooded her clothing-even at night,  due the volume of menstrual flow while enduring severe "contraction-like"  cramps that radiated down her legs and  required narcotic analgesia.  This same pain,  that included her ower back,  accompanied ovulation and would occur, randomly at times.  She denies inter-menstrual bleeding or  changes in bowel or bladder function.  A pelvic ultrasound in October 2016 revealed: uterus-9.97 x 5.77 x 6.46 cm with #2 fibroids-posterior: 2.24 cm and anterior-1.11 cm;  right ovary-4.03 x 3.84 x 2.74 cm with a solid appearing mass measuring 2.4 x 2.1 x 2.4 cm that was noted to be persistent when compared with an August 2016 ultrasound;  left ovary-5.63 x 5.28 x 2.82 cm with #2 cystic structures measuring:  2.7 x 2.6 x 2.0 cm and 2.2 x 2.1 x 2.0 cm.  An endometrial biopsy done at the same time showed benign proliferative endometrium with no hyperplasia or malignancy.  The patient was placed on Lysteda 650 mg three times daily for her menorrhagia with significant decrease in flow  such that she changed a regular tampon every 2 hours and a decrease in her pain from 10/10 on a 10 point pain scale to 2/10.  A review of both medical and surgical management options were given to the patient however,  she would like to proceed with laparoscopic removal of both ovarian cysts and hyseroscopy with dilatation and curettage.  Past Medical History  OB History: G:1;  P: 1-0-0-1;  C-secton 2004  GYN History: menarche: 40 YO    LMP: 09/09/16     Contracepton no method  The  patient denies history of sexually transmitted disease.  Denies history of abnormal PAP smear.  Last PAP smear:  September 2016  Medical History: Vitamin D Deficiency, Migraine, Positive TB Skin Test, Black Out Spells (none since 1996), Anxiety, Depression, Pneunomia, GERD, Sleep Apnea, Right Knee Arthritis,  Right Dermoid Cyst, Neck Pain  and Eustachian Tube Dysfunction.  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  Sensitive to adhesives   ROS: Admits to glasses and menstrual migraines but  denies  vision changes, nasal congestion, dysphagia, tinnitus, dizziness, hoarseness, cough,  chest pain, shortness of breath, nausea, vomiting, diarrhea, constipation,  urinary frequency, urgency  dysuria,  hematuria, vaginitis symptoms,  swelling of joints,easy bruising,  myalgias, arthralgias, skin rashes, unexplained weight loss and except as is mentioned in the history of present illness, patient's review of systems is otherwise negative.   Physical Exam  Bp:  102/70      P: 68       R: 20      Temperature: 99.3 degrees F orally       Height: 5'1"       Weight: 290 lbs.        BMI: 54.8  Neck: supple  without masses or thyromegaly Lungs: clear to auscultation Heart: regular rate and rhythm Abdomen: soft, non-tender and no organomegaly Pelvic:EGBUS- wnl; vagina-normal rugae with brown discharge; uterus-upper limits of normal size descended to 1 cm just inside of introitus (exam limited by habitus) , cervix without lesions or motion tenderness; adnexae-no tenderness or masses Extremities:  no clubbing, cyanosis or edema   Assesment: Menorrhagia           Bilateral Ovarian Cysts           Pelvic Pain   Disposition:  Reviewed the risks of surgery to include, but not limited to: reaction to anesthesia, damage to adjacent organs, infection,  excessive bleeding and endometrial scarring.  The patient verbalized understanding of these risks and has consented to proceed with Laparoscopic Ovarian Cystectomy, Hysteroscopy, Dilatation, Curettage,  and Possible Chromopertubation at [facility] on October 09, 2015 at 9 a.m.   CSN# [id number]   Dezyrae Kensinger J. Florene Glen, PA-C  for Dr. Harvie Bridge. Mancel Bale

## 2015-09-29 ENCOUNTER — Inpatient Hospital Stay (HOSPITAL_COMMUNITY)
Admission: RE | Admit: 2015-09-29 | Discharge: 2015-09-29 | Disposition: A | Discharge status: Home / Self Care | Payer: BC Managed Care – PPO | Source: Ambulatory Visit

## 2015-10-09 ENCOUNTER — Ambulatory Visit (HOSPITAL_COMMUNITY)
Admission: RE | Admit: 2015-10-09 | Discharge: 2015-10-09 | Disposition: A | Discharge status: Home / Self Care | Payer: BC Managed Care – PPO | Source: Ambulatory Visit | Attending: Obstetrics and Gynecology | Admitting: Obstetrics and Gynecology

## 2015-10-09 ENCOUNTER — Encounter (HOSPITAL_COMMUNITY): Payer: Self-pay

## 2015-10-09 ENCOUNTER — Ambulatory Visit (HOSPITAL_COMMUNITY): Payer: BC Managed Care – PPO | Admitting: Anesthesiology

## 2015-10-09 ENCOUNTER — Encounter (HOSPITAL_COMMUNITY)
Admission: RE | Disposition: A | Discharge status: Home / Self Care | Payer: Self-pay | Source: Ambulatory Visit | Attending: Obstetrics and Gynecology

## 2015-10-09 DIAGNOSIS — K219 Gastro-esophageal reflux disease without esophagitis: Secondary | ICD-10-CM | POA: Insufficient documentation

## 2015-10-09 DIAGNOSIS — M25559 Pain in unspecified hip: Secondary | ICD-10-CM

## 2015-10-09 DIAGNOSIS — N801 Endometriosis of ovary: Secondary | ICD-10-CM

## 2015-10-09 DIAGNOSIS — N83202 Unspecified ovarian cyst, left side: Secondary | ICD-10-CM | POA: Diagnosis not present

## 2015-10-09 DIAGNOSIS — N83201 Unspecified ovarian cyst, right side: Secondary | ICD-10-CM

## 2015-10-09 DIAGNOSIS — N838 Other noninflammatory disorders of ovary, fallopian tube and broad ligament: Secondary | ICD-10-CM | POA: Insufficient documentation

## 2015-10-09 DIAGNOSIS — Z6841 Body Mass Index (BMI) 40.0 and over, adult: Secondary | ICD-10-CM | POA: Diagnosis not present

## 2015-10-09 DIAGNOSIS — R102 Pelvic and perineal pain: Secondary | ICD-10-CM | POA: Insufficient documentation

## 2015-10-09 DIAGNOSIS — N920 Excessive and frequent menstruation with regular cycle: Secondary | ICD-10-CM | POA: Insufficient documentation

## 2015-10-09 DIAGNOSIS — N80129 Deep endometriosis of ovary, unspecified ovary: Secondary | ICD-10-CM

## 2015-10-09 HISTORY — PX: DILITATION & CURRETTAGE/HYSTROSCOPY WITH NOVASURE ABLATION: SHX5568

## 2015-10-09 HISTORY — PX: CHROMOPERTUBATION: SHX6288

## 2015-10-09 HISTORY — PX: LAPAROSCOPIC OVARIAN CYSTECTOMY: SHX6248

## 2015-10-09 LAB — CBC
HCT: 38.4 % (ref 36.0–46.0)
Hemoglobin: 12.9 g/dL (ref 12.0–15.0)
MCH: 30.9 pg (ref 26.0–34.0)
MCHC: 33.6 g/dL (ref 30.0–36.0)
MCV: 92.1 fL (ref 78.0–100.0)
Platelets: 239 10*3/uL (ref 150–400)
RBC: 4.17 MIL/uL (ref 3.87–5.11)
RDW: 13.4 % (ref 11.5–15.5)
WBC: 8.7 10*3/uL (ref 4.0–10.5)

## 2015-10-09 LAB — PREGNANCY, URINE: Preg Test, Ur: NEGATIVE

## 2015-10-09 SURGERY — EXCISION, CYST, OVARY, LAPAROSCOPIC
Anesthesia: General | Laterality: Right

## 2015-10-09 MED ORDER — MEPERIDINE HCL 25 MG/ML IJ SOLN: 6.2500 mg | INTRAMUSCULAR | Status: DC | PRN

## 2015-10-09 MED ORDER — FENTANYL CITRATE (PF) 100 MCG/2ML IJ SOLN
INTRAMUSCULAR | Status: AC
  Administered 2015-10-09: 25 ug via INTRAVENOUS
  Filled 2015-10-09: qty 2

## 2015-10-09 MED ORDER — PHENYLEPHRINE HCL 10 MG/ML IJ SOLN
INTRAMUSCULAR | Status: DC | PRN
  Administered 2015-10-09: 100 ug via INTRAVENOUS

## 2015-10-09 MED ORDER — SODIUM CHLORIDE 0.9 % IJ SOLN
INTRAMUSCULAR | Status: AC
Start: 2015-10-09 — End: 2015-10-09
  Filled 2015-10-09: qty 100

## 2015-10-09 MED ORDER — LACTATED RINGERS IV SOLN
INTRAVENOUS | Status: DC
  Administered 2015-10-09 (×3): via INTRAVENOUS

## 2015-10-09 MED ORDER — MIDAZOLAM HCL 2 MG/2ML IJ SOLN
INTRAMUSCULAR | Status: DC | PRN
  Administered 2015-10-09: 2 mg via INTRAVENOUS

## 2015-10-09 MED ORDER — SCOPOLAMINE 1 MG/3DAYS TD PT72
MEDICATED_PATCH | TRANSDERMAL | Status: AC
  Administered 2015-10-09: 1.5 mg via TRANSDERMAL
  Filled 2015-10-09: qty 1

## 2015-10-09 MED ORDER — SODIUM CHLORIDE 0.9 % IJ SOLN
INTRAMUSCULAR | Status: AC
Start: 2015-10-09 — End: 2015-10-09
  Filled 2015-10-09: qty 50

## 2015-10-09 MED ORDER — GLYCINE 1.5 % IR SOLN
Status: DC | PRN
  Administered 2015-10-09: 1000 mL

## 2015-10-09 MED ORDER — DEXAMETHASONE SODIUM PHOSPHATE 4 MG/ML IJ SOLN
INTRAMUSCULAR | Status: AC
  Filled 2015-10-09: qty 1

## 2015-10-09 MED ORDER — PROPOFOL 10 MG/ML IV BOLUS
INTRAVENOUS | Status: AC
  Filled 2015-10-09: qty 20

## 2015-10-09 MED ORDER — KETOROLAC TROMETHAMINE 30 MG/ML IJ SOLN
INTRAMUSCULAR | Status: DC | PRN
  Administered 2015-10-09: 30 mg via INTRAVENOUS

## 2015-10-09 MED ORDER — OXYCODONE-ACETAMINOPHEN 5-325 MG PO TABS: 1.0000 | ORAL_TABLET | Freq: Four times a day (QID) | ORAL | Status: DC | PRN

## 2015-10-09 MED ORDER — ROCURONIUM BROMIDE 100 MG/10ML IV SOLN
INTRAVENOUS | Status: AC
  Filled 2015-10-09: qty 1

## 2015-10-09 MED ORDER — METHYLENE BLUE 1 % INJ SOLN
INTRAMUSCULAR | Status: AC
  Filled 2015-10-09: qty 1

## 2015-10-09 MED ORDER — DEXAMETHASONE SODIUM PHOSPHATE 10 MG/ML IJ SOLN
INTRAMUSCULAR | Status: DC | PRN
  Administered 2015-10-09: 4 mg via INTRAVENOUS

## 2015-10-09 MED ORDER — LIDOCAINE HCL (CARDIAC) 20 MG/ML IV SOLN
INTRAVENOUS | Status: AC
  Filled 2015-10-09: qty 5

## 2015-10-09 MED ORDER — PROPOFOL 10 MG/ML IV BOLUS
INTRAVENOUS | Status: DC | PRN
  Administered 2015-10-09: 200 mg via INTRAVENOUS

## 2015-10-09 MED ORDER — OXYCODONE-ACETAMINOPHEN 5-325 MG PO TABS
ORAL_TABLET | ORAL | Status: AC
  Administered 2015-10-09: 1 via ORAL
  Filled 2015-10-09: qty 1

## 2015-10-09 MED ORDER — ROCURONIUM BROMIDE 100 MG/10ML IV SOLN
INTRAVENOUS | Status: DC | PRN
  Administered 2015-10-09: 50 mg via INTRAVENOUS
  Administered 2015-10-09: 10 mg via INTRAVENOUS

## 2015-10-09 MED ORDER — GLYCOPYRROLATE 0.2 MG/ML IJ SOLN
INTRAMUSCULAR | Status: AC
  Filled 2015-10-09: qty 4

## 2015-10-09 MED ORDER — IBUPROFEN 600 MG PO TABS: ORAL_TABLET | ORAL | Status: DC

## 2015-10-09 MED ORDER — FENTANYL CITRATE (PF) 100 MCG/2ML IJ SOLN
INTRAMUSCULAR | Status: AC
Start: 2015-10-09 — End: 2015-10-09
  Filled 2015-10-09: qty 2

## 2015-10-09 MED ORDER — BUPIVACAINE HCL (PF) 0.25 % IJ SOLN
INTRAMUSCULAR | Status: AC
  Filled 2015-10-09: qty 30

## 2015-10-09 MED ORDER — METOCLOPRAMIDE HCL 5 MG/ML IJ SOLN
10.0000 mg | Freq: Once | INTRAMUSCULAR | Status: AC | PRN
  Administered 2015-10-09: 10 mg via INTRAVENOUS

## 2015-10-09 MED ORDER — PHENYLEPHRINE 40 MCG/ML (10ML) SYRINGE FOR IV PUSH (FOR BLOOD PRESSURE SUPPORT)
PREFILLED_SYRINGE | INTRAVENOUS | Status: AC
  Filled 2015-10-09: qty 10

## 2015-10-09 MED ORDER — FENTANYL CITRATE (PF) 100 MCG/2ML IJ SOLN
25.0000 ug | INTRAMUSCULAR | Status: DC | PRN
  Administered 2015-10-09: 50 ug via INTRAVENOUS
  Administered 2015-10-09: 25 ug via INTRAVENOUS

## 2015-10-09 MED ORDER — KETOROLAC TROMETHAMINE 30 MG/ML IJ SOLN
INTRAMUSCULAR | Status: AC
  Filled 2015-10-09: qty 1

## 2015-10-09 MED ORDER — OXYCODONE-ACETAMINOPHEN 5-325 MG PO TABS
1.0000 | ORAL_TABLET | ORAL | Status: DC | PRN
  Administered 2015-10-09: 1 via ORAL

## 2015-10-09 MED ORDER — FENTANYL CITRATE (PF) 100 MCG/2ML IJ SOLN
INTRAMUSCULAR | Status: DC | PRN
  Administered 2015-10-09: 100 ug via INTRAVENOUS
  Administered 2015-10-09: 50 ug via INTRAVENOUS
  Administered 2015-10-09 (×3): 100 ug via INTRAVENOUS

## 2015-10-09 MED ORDER — ONDANSETRON HCL 4 MG/2ML IJ SOLN
INTRAMUSCULAR | Status: AC
Start: 2015-10-09 — End: 2015-10-09
  Filled 2015-10-09: qty 2

## 2015-10-09 MED ORDER — BUPIVACAINE HCL (PF) 0.25 % IJ SOLN
INTRAMUSCULAR | Status: DC | PRN
  Administered 2015-10-09: 20 mL

## 2015-10-09 MED ORDER — SODIUM CHLORIDE 0.9 % IJ SOLN
INTRAMUSCULAR | Status: AC
  Filled 2015-10-09: qty 50

## 2015-10-09 MED ORDER — MIDAZOLAM HCL 2 MG/2ML IJ SOLN
INTRAMUSCULAR | Status: AC
  Filled 2015-10-09: qty 2

## 2015-10-09 MED ORDER — GLYCOPYRROLATE 0.2 MG/ML IJ SOLN
INTRAMUSCULAR | Status: DC | PRN
  Administered 2015-10-09: .8 mg via INTRAVENOUS

## 2015-10-09 MED ORDER — LIDOCAINE HCL 1 % IJ SOLN
INTRAMUSCULAR | Status: AC
  Filled 2015-10-09: qty 20

## 2015-10-09 MED ORDER — GENTAMICIN SULFATE 40 MG/ML IJ SOLN
INTRAVENOUS | Status: AC
  Administered 2015-10-09: 116 mL via INTRAVENOUS
  Filled 2015-10-09: qty 10.25

## 2015-10-09 MED ORDER — ACETAMINOPHEN 10 MG/ML IV SOLN
1000.0000 mg | Freq: Once | INTRAVENOUS | Status: AC
  Administered 2015-10-09: 1000 mg via INTRAVENOUS
  Filled 2015-10-09: qty 100

## 2015-10-09 MED ORDER — NEOSTIGMINE METHYLSULFATE 10 MG/10ML IV SOLN
INTRAVENOUS | Status: DC | PRN
  Administered 2015-10-09: 5 mg via INTRAVENOUS

## 2015-10-09 MED ORDER — FENTANYL CITRATE (PF) 100 MCG/2ML IJ SOLN
INTRAMUSCULAR | Status: AC
  Filled 2015-10-09: qty 2

## 2015-10-09 MED ORDER — NEOSTIGMINE METHYLSULFATE 10 MG/10ML IV SOLN
INTRAVENOUS | Status: AC
  Filled 2015-10-09: qty 1

## 2015-10-09 MED ORDER — METOCLOPRAMIDE HCL 5 MG/ML IJ SOLN
INTRAMUSCULAR | Status: AC
  Administered 2015-10-09: 10 mg via INTRAVENOUS
  Filled 2015-10-09: qty 2

## 2015-10-09 MED ORDER — SCOPOLAMINE 1 MG/3DAYS TD PT72
1.0000 | MEDICATED_PATCH | Freq: Once | TRANSDERMAL | Status: DC
  Administered 2015-10-09: 1.5 mg via TRANSDERMAL

## 2015-10-09 MED ORDER — TRANEXAMIC ACID 650 MG PO TABS (ORTHO): 1300.0000 mg | ORAL_TABLET | Freq: Three times a day (TID) | ORAL | Status: DC | PRN

## 2015-10-09 MED ORDER — ONDANSETRON HCL 4 MG/2ML IJ SOLN
INTRAMUSCULAR | Status: DC | PRN
  Administered 2015-10-09: 4 mg via INTRAVENOUS

## 2015-10-09 MED ORDER — HYDROCODONE-ACETAMINOPHEN 7.5-325 MG PO TABS: 1.0000 | ORAL_TABLET | Freq: Once | ORAL | Status: DC | PRN

## 2015-10-09 MED ORDER — FENTANYL CITRATE (PF) 250 MCG/5ML IJ SOLN
INTRAMUSCULAR | Status: AC
  Filled 2015-10-09: qty 5

## 2015-10-09 MED ORDER — METOCLOPRAMIDE HCL 5 MG/ML IJ SOLN
INTRAMUSCULAR | Status: AC
  Filled 2015-10-09: qty 2

## 2015-10-09 MED ORDER — LIDOCAINE HCL (CARDIAC) 20 MG/ML IV SOLN
INTRAVENOUS | Status: DC | PRN
  Administered 2015-10-09: 100 mg via INTRAVENOUS

## 2015-10-09 SURGICAL SUPPLY — 44 items
ABLATOR ENDOMETRIAL BIPOLAR (ABLATOR) ×3 IMPLANT
BAG SPEC RTRVL LRG 6X4 10 (ENDOMECHANICALS)
CABLE HIGH FREQUENCY MONO STRZ (ELECTRODE) ×1 IMPLANT
CATH ROBINSON RED A/P 16FR (CATHETERS) ×3 IMPLANT
CHLORAPREP W/TINT 26ML (MISCELLANEOUS) ×4 IMPLANT
CLOTH BEACON ORANGE TIMEOUT ST (SAFETY) ×4 IMPLANT
CONTAINER PREFILL 10% NBF 60ML (FORM) ×6 IMPLANT
DRSG COVADERM PLUS 2X2 (GAUZE/BANDAGES/DRESSINGS) ×6 IMPLANT
DRSG OPSITE POSTOP 3X4 (GAUZE/BANDAGES/DRESSINGS) ×2 IMPLANT
FORCEPS CUTTING 33CM 5MM (CUTTING FORCEPS) IMPLANT
GLOVE BIO SURGEON STRL SZ7.5 (GLOVE) ×4 IMPLANT
GLOVE BIOGEL PI IND STRL 7.0 (GLOVE) ×3 IMPLANT
GLOVE BIOGEL PI IND STRL 7.5 (GLOVE) ×3 IMPLANT
GLOVE BIOGEL PI INDICATOR 7.0 (GLOVE) ×4
GLOVE BIOGEL PI INDICATOR 7.5 (GLOVE) ×2
GOWN STRL REUS W/TWL LRG LVL3 (GOWN DISPOSABLE) ×11 IMPLANT
HEMOSTAT SURGICEL 2X3 (HEMOSTASIS) ×1 IMPLANT
LIQUID BAND (GAUZE/BANDAGES/DRESSINGS) ×4 IMPLANT
NEEDLE INSUFFLATION 120MM (ENDOMECHANICALS) ×1 IMPLANT
NS IRRIG 1000ML POUR BTL (IV SOLUTION) ×3 IMPLANT
PACK LAPAROSCOPY BASIN (CUSTOM PROCEDURE TRAY) ×4 IMPLANT
PACK VAGINAL MINOR WOMEN LF (CUSTOM PROCEDURE TRAY) ×4 IMPLANT
PAD OB MATERNITY 4.3X12.25 (PERSONAL CARE ITEMS) ×4 IMPLANT
PAD PREP 24X48 CUFFED NSTRL (MISCELLANEOUS) ×4 IMPLANT
PAD TRENDELENBURG POSITION (MISCELLANEOUS) ×4 IMPLANT
POUCH SPECIMEN RETRIEVAL 10MM (ENDOMECHANICALS) IMPLANT
SCISSORS LAP 5X45 EPIX DISP (ENDOMECHANICALS) ×1 IMPLANT
SET IRRIG TUBING LAPAROSCOPIC (IRRIGATION / IRRIGATOR) ×1 IMPLANT
SLEEVE XCEL OPT CAN 5 100 (ENDOMECHANICALS) ×1 IMPLANT
SOLUTION ELECTROLUBE (MISCELLANEOUS) IMPLANT
SUT MNCRL AB 3-0 PS2 27 (SUTURE) ×4 IMPLANT
SUT VICRYL 0 ENDOLOOP (SUTURE) IMPLANT
SUT VICRYL 0 TIES 12 18 (SUTURE) IMPLANT
SUT VICRYL 0 UR6 27IN ABS (SUTURE) ×4 IMPLANT
SYR 50ML LL SCALE MARK (SYRINGE) IMPLANT
TOWEL OR 17X24 6PK STRL BLUE (TOWEL DISPOSABLE) ×9 IMPLANT
TRAY FOLEY CATH SILVER 14FR (SET/KITS/TRAYS/PACK) ×4 IMPLANT
TROCAR DILATING TIP 12MM 150MM (ENDOMECHANICALS) ×1 IMPLANT
TROCAR XCEL NON-BLD 11X100MML (ENDOMECHANICALS) ×4 IMPLANT
TROCAR XCEL NON-BLD 5MMX100MML (ENDOMECHANICALS) ×4 IMPLANT
TUBING AQUILEX INFLOW (TUBING) ×4 IMPLANT
TUBING AQUILEX OUTFLOW (TUBING) ×4 IMPLANT
WARMER LAPAROSCOPE (MISCELLANEOUS) ×4 IMPLANT
WATER STERILE IRR 1000ML POUR (IV SOLUTION) ×3 IMPLANT

## 2015-10-09 NOTE — Anesthesia Procedure Notes (Signed)
Procedure Name: Intubation Date/Time: 10/09/2015 9:06 AM Performed by: Raenette Rover Pre-anesthesia Checklist: Patient identified, Emergency Drugs available, Suction available and Patient being monitored Patient Re-evaluated:Patient Re-evaluated prior to inductionOxygen Delivery Method: Circle system utilized Preoxygenation: Pre-oxygenation with 100% oxygen Intubation Type: IV induction Ventilation: Mask ventilation without difficulty Laryngoscope Size: Miller and 3 Grade View: Grade I Tube type: Oral Tube size: 7.0 mm Number of attempts: 1 Airway Equipment and Method: Patient positioned with wedge pillow and Stylet Placement Confirmation: ETT inserted through vocal cords under direct vision,  positive ETCO2,  CO2 detector and breath sounds checked- equal and bilateral Secured at: 20 cm Tube secured with: Tape Dental Injury: Teeth and Oropharynx as per pre-operative assessment

## 2015-10-09 NOTE — Anesthesia Postprocedure Evaluation (Signed)
Anesthesia Post Note  Patient: ARYEL PETRELLI  Procedure(s) Performed: Procedure(s) (LRB): LAPAROSCOPIC RIGHT OVARIAN CYSTECTOMIES, LEFT PARATUBAL CYSTECTOMY  (Right) DILATATION & CURETTAGE/HYSTEROSCOPY (N/A) CHROMOPERTUBATION (Bilateral)  Patient location during evaluation: PACU Anesthesia Type: General Level of consciousness: awake and alert Pain management: pain level controlled Vital Signs Assessment: post-procedure vital signs reviewed and stable Respiratory status: spontaneous breathing, nonlabored ventilation, respiratory function stable and patient connected to nasal cannula oxygen Cardiovascular status: blood pressure returned to baseline and stable Postop Assessment: no signs of nausea or vomiting Anesthetic complications: no    Last Vitals:  Filed Vitals:   10/09/15 1145 10/09/15 1200  BP: 135/80 133/88  Pulse: 103 98  Temp: 37.1 C   Resp: 12 12    Last Pain:  Filed Vitals:   10/09/15 1206  PainSc: 8                  Agness Sibrian A.

## 2015-10-09 NOTE — Transfer of Care (Signed)
Immediate Anesthesia Transfer of Care Note  Patient: Tina Hunt  Procedure(s) Performed: Procedure(s): LAPAROSCOPIC RIGHT OVARIAN CYSTECTOMIES, LEFT PARATUBAL CYSTECTOMY  (Right) DILATATION & CURETTAGE/HYSTEROSCOPY (N/A) CHROMOPERTUBATION (Bilateral)  Patient Location: PACU  Anesthesia Type:Regional  Level of Consciousness: awake, alert , oriented and patient cooperative  Airway & Oxygen Therapy: Patient Spontanous Breathing and Patient connected to nasal cannula oxygen  Post-op Assessment: Report given to RN and Post -op Vital signs reviewed and stable  Post vital signs: Reviewed and stable  Last Vitals:  Filed Vitals:   10/09/15 0758  BP: 120/83  Pulse: 63  Temp: 36.7 C  Resp: 20    Complications: No apparent anesthesia complications

## 2015-10-09 NOTE — Discharge Instructions (Signed)
Call [facility] @ [phone] if:  You have a temperature greater than or equal to 100.4 degrees Farenheit orally You have pain that is not made better by the pain medication given and taken as directed You have excessive bleeding or problems urinating  Take Colace (Docusate Sodium/Stool Softener) 100 mg 2-3 times daily while taking narcotic pain medicine to avoid constipation or until bowel movements are regular. Take with food,  Ibuprofen 600 mg (starting at 5:30 p.m. on day of surgery) every 6 hours for 5 days then as needed for pain  You may drive after 48 hours You may walk up steps  You may shower tomorrow You may resume a regular diet  Keep incisions clean and dry Avoid anything in vagina for until after your post-operative visit  May remove Scop patch on or before 10/11/15.

## 2015-10-09 NOTE — Anesthesia Preprocedure Evaluation (Signed)
Anesthesia Evaluation  Patient identified by MRN, date of birth, ID band Patient awake    Reviewed: Allergy & Precautions, NPO status , Patient's Chart, lab work & pertinent test results  History of Anesthesia Complications (+) PONV and history of anesthetic complications  Airway Mallampati: III  TM Distance: >3 FB Neck ROM: Full    Dental no notable dental hx. (+) Teeth Intact   Pulmonary sleep apnea , pneumonia, resolved,  Hx/o (+) PPD   Pulmonary exam normal breath sounds clear to auscultation       Cardiovascular negative cardio ROS Normal cardiovascular exam Rhythm:Regular Rate:Normal     Neuro/Psych  Headaches, PSYCHIATRIC DISORDERS Anxiety Depression  Neuromuscular disease    GI/Hepatic Neg liver ROS, GERD  Medicated and Controlled,  Endo/Other  Morbid obesity  Renal/GU negative Renal ROS  negative genitourinary   Musculoskeletal  (+) Arthritis , Osteoarthritis,    Abdominal (+) + obese,   Peds  Hematology   Anesthesia Other Findings   Reproductive/Obstetrics                             Anesthesia Physical Anesthesia Plan  ASA: III  Anesthesia Plan: General   Post-op Pain Management:    Induction: Intravenous  Airway Management Planned: Oral ETT  Additional Equipment:   Intra-op Plan:   Post-operative Plan: Extubation in OR  Informed Consent: I have reviewed the patients History and Physical, chart, labs and discussed the procedure including the risks, benefits and alternatives for the proposed anesthesia with the patient or authorized representative who has indicated his/her understanding and acceptance.   Dental advisory given  Plan Discussed with: Anesthesiologist, CRNA and Surgeon  Anesthesia Plan Comments:         Anesthesia Quick Evaluation

## 2015-10-09 NOTE — Op Note (Signed)
Preop Diagnosis: 1.OVARIAN CYST 2.MENORRHAGIA   Postop Diagnosis: 1.RIGHT OVARIAN CYSTS 2.LEFT PARATUBAL CYST 3.RIGHT TUBAL OCCLUSION 4.LEFT TUBAL PATENCY 5.MENORRHAGIA   Procedure: 1.LAPAROSCOPIC RIGHT OVARIAN CYSTECTOMY 2.HYSTEROSCOPY 3.DILATION and CURETTAGE 4.CHROMOPERTUBATION 5.BIOPSY OF POSTERIOR CUL-DE-SAC  Anesthesia: General   Anesthesiologist: Josephine Igo, MD   Attending: Everett Graff, MD   Assistant:  Earnstine Regal, PA-C  Findings: 1.2 right ovarian cysts (not larger than 2cm).  2.1 appeared to be an endometrioma with chocolate fluid 3.Adhesions of rt adnexa to posterior uterine wall 4.No intrauterine lesions  Pathology: 1.rt ovarian cysts 2.left paratubal cyst 3.biopsy of cul-de-sac 4.endometrial curettings  Fluids: 1600 cc  UOP: 200 cc  EBL: 25 cc  Complications: None  Procedure: The patient was taken to the operating room after the risks, benefits, alternatives, complications, treatment options, and expected outcomes were discussed with the patient. The patient verbalized understanding, the patient concurred with the proposed plan and consent signed and witnessed. The patient was taken to the Operating Room and identified as Tina Hunt and the procedure verified as Laparoscopic Cystectomy, Hysteroscopy/D&C and Chromopertubation.  The patient was placed under general anesthesia per anesthesia staff, the patient was placed in modified dorsal lithotomy position and was prepped, draped, and catheterized in the normal, sterile fashion.  A Time Out was held and the above information confirmed.  The cervix was visualized and an acorn intrauterine manipulator was placed.  A 10 mm umbilical incision was then performed. Veress needle was passed and pneumoperitoneum was established. A 10 mm trocar was advanced into the intraabdominal cavity, the laparoscope was introduced and findings as noted above.  Patient was placed in trendelenburg and marcaine injected in  the suprapubic area and a 5 mm incision was made and 5 mm trocar advanced into the intraabdominal cavity.  The same was done in the LLQ and a 5 mm in the suprapubic area.   The adhesions of the right adnexa to the posterior wall of the uterus were removed bluntly and cyst ruptured with chocolate fluid contents.  The cyst walls were removed and bed cauterized.  Monopolar scissors were used to incise over the left paratubal cyst and cyst dissected out without difficulty.  A window in the left pelvic posterior cul-de-sac was noted and a biopsy of this area performed.  Surgicel was placed on the right ovary and in the window for hemostasis.    The two 44mm trocars were removed under direct visualization.  Pneumoperitoneum was relieved and 10 mm umbilical trocar removed under direct visualization.  The umbilical fascia was repaired with 0 vicryl.  The 10 mm skin incision was repaired with 3-0 monocryl via a subcuticular stitch and liquaband was applied to all incisions.  Sponge, instrument, lap and needle counts were correct.    Attention was then turned to the perineum and Time Out was performed per protocol.  A bivalve speculum was placed in the patient's vagina and the anterior lip of the cervix was grasped with a single tooth tenaculum. A paracervical block was administered using a total of 10 cc of 1% lidocaine. The uterus sounded to 9 cm. The cervix was dilated for passage of the hysteroscope.  The hysteroscope was introduced into the uterine cavity and findings as noted above. Sharp curettage was performed until a gritty texture was noted and curettings were sent to pathology. The hysteroscope was reintroduced and no obvious remaining intracavitary lesions were noted.  All instruments were removed. Sponge lap and needle count was correct. The patient tolerated the procedure well  and was returned to the recovery room in good condition.

## 2015-10-09 NOTE — H&P (View-Only) (Signed)
Tina Hunt is a 40 y.o.  female P: 1-0-0-1 who presents for laparoscopic ovarian cystectomy and hysteroscopy D & C because  of pelvic pain, persistent ovarian cyst and menorrhagia. The patient gives a history of always having a heavy menstrual period and only began oral contraceptives as an adult to manage it.   For the past year her period has become progressively worse, though it only lasts for 3 days.  Since August she has flooded her clothing-even at night,  due the volume of menstrual flow while enduring severe "contraction-like"  cramps that radiated down her legs and  required narcotic analgesia.  This same pain,  that included her ower back,  accompanied ovulation and would occur, randomly at times.  She denies inter-menstrual bleeding or  changes in bowel or bladder function.  A pelvic ultrasound in October 2016 revealed: uterus-9.97 x 5.77 x 6.46 cm with #2 fibroids-posterior: 2.24 cm and anterior-1.11 cm;  right ovary-4.03 x 3.84 x 2.74 cm with a solid appearing mass measuring 2.4 x 2.1 x 2.4 cm that was noted to be persistent when compared with an August 2016 ultrasound;  left ovary-5.63 x 5.28 x 2.82 cm with #2 cystic structures measuring:  2.7 x 2.6 x 2.0 cm and 2.2 x 2.1 x 2.0 cm.  An endometrial biopsy done at the same time showed benign proliferative endometrium with no hyperplasia or malignancy.  The patient was placed on Lysteda 650 mg three times daily for her menorrhagia with significant decrease in flow  such that she changed a regular tampon every 2 hours and a decrease in her pain from 10/10 on a 10 point pain scale to 2/10.  A review of both medical and surgical management options were given to the patient however,  she would like to proceed with laparoscopic removal of both ovarian cysts and hyseroscopy with dilatation and curettage.  Past Medical History  OB History: G:1;  P: 1-0-0-1;  C-secton 2004  GYN History: menarche: 40 YO    LMP: 09/09/16     Contracepton no method  The  patient denies history of sexually transmitted disease.  Denies history of abnormal PAP smear.  Last PAP smear:  September 2016  Medical History: Vitamin D Deficiency, Migraine, Positive TB Skin Test, Black Out Spells (none since 1996), Anxiety, Depression, Pneunomia, GERD, Sleep Apnea, Right Knee Arthritis,  Right Dermoid Cyst, Neck Pain  and Eustachian Tube Dysfunction.  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  Sensitive to adhesives   ROS: Admits to glasses and menstrual migraines but  denies  vision changes, nasal congestion, dysphagia, tinnitus, dizziness, hoarseness, cough,  chest pain, shortness of breath, nausea, vomiting, diarrhea, constipation,  urinary frequency, urgency  dysuria,  hematuria, vaginitis symptoms,  swelling of joints,easy bruising,  myalgias, arthralgias, skin rashes, unexplained weight loss and except as is mentioned in the history of present illness, patient's review of systems is otherwise negative.   Physical Exam  Bp:  102/70      P: 68       R: 20      Temperature: 99.3 degrees F orally       Height: 5'1"       Weight: 290 lbs.        BMI: 54.8  Neck: supple  without masses or thyromegaly Lungs: clear to auscultation Heart: regular rate and rhythm Abdomen: soft, non-tender and no organomegaly Pelvic:EGBUS- wnl; vagina-normal rugae with brown discharge; uterus-upper limits of normal size descended to 1 cm just inside of introitus (exam limited by habitus) , cervix without lesions or motion tenderness; adnexae-no tenderness or masses Extremities:  no clubbing, cyanosis or edema   Assesment: Menorrhagia           Bilateral Ovarian Cysts           Pelvic Pain   Disposition:  Reviewed the risks of surgery to include, but not limited to: reaction to anesthesia, damage to adjacent organs, infection,  excessive bleeding and endometrial scarring.  The patient verbalized understanding of these risks and has consented to proceed with Laparoscopic Ovarian Cystectomy, Hysteroscopy, Dilatation, Curettage,  and Possible Chromopertubation at [facility] on October 09, 2015 at 9 a.m.   CSN# [id number]   Ashika Apuzzo J. Florene Glen, PA-C  for Dr. Harvie Bridge. Mancel Bale

## 2015-10-09 NOTE — Interval H&P Note (Signed)
History and Physical Interval Note:  10/09/2015 8:50 AM  Tina Hunt  has presented today for surgery, with the diagnosis of OVARIAN CYST,MENORRHAGIA  The various methods of treatment have been discussed with the patient and family. After consideration of risks, benefits and other options for treatment, the patient has consented to  Procedure(s): LAPAROSCOPIC OVARIAN CYSTECTOMY POSSIBLE BILATERAL (Right) DILATATION & CURETTAGE/HYSTEROSCOPY (N/A) CHROMOPERTUBATION (Bilateral) as a surgical intervention .  The patient's history has been reviewed, patient examined, no change in status, stable for surgery.  I have reviewed the patient's chart and labs.  Questions were answered to the patient's satisfaction.     Delice Lesch

## 2015-10-12 ENCOUNTER — Encounter (HOSPITAL_COMMUNITY): Payer: Self-pay | Admitting: Obstetrics and Gynecology

## 2015-10-13 SURGERY — Surgical Case
Anesthesia: *Unknown

## 2015-12-19 ENCOUNTER — Encounter (HOSPITAL_BASED_OUTPATIENT_CLINIC_OR_DEPARTMENT_OTHER): Payer: Self-pay | Admitting: Emergency Medicine

## 2015-12-19 ENCOUNTER — Emergency Department (HOSPITAL_BASED_OUTPATIENT_CLINIC_OR_DEPARTMENT_OTHER)
Admission: EM | Admit: 2015-12-19 | Discharge: 2015-12-19 | Disposition: A | Discharge status: Home / Self Care | Payer: BC Managed Care – PPO | Attending: Emergency Medicine | Admitting: Emergency Medicine

## 2015-12-19 DIAGNOSIS — Z9981 Dependence on supplemental oxygen: Secondary | ICD-10-CM | POA: Diagnosis not present

## 2015-12-19 DIAGNOSIS — F419 Anxiety disorder, unspecified: Secondary | ICD-10-CM | POA: Diagnosis not present

## 2015-12-19 DIAGNOSIS — Z79899 Other long term (current) drug therapy: Secondary | ICD-10-CM | POA: Diagnosis not present

## 2015-12-19 DIAGNOSIS — Z8719 Personal history of other diseases of the digestive system: Secondary | ICD-10-CM | POA: Insufficient documentation

## 2015-12-19 DIAGNOSIS — F329 Major depressive disorder, single episode, unspecified: Secondary | ICD-10-CM | POA: Diagnosis not present

## 2015-12-19 DIAGNOSIS — G43909 Migraine, unspecified, not intractable, without status migrainosus: Secondary | ICD-10-CM | POA: Insufficient documentation

## 2015-12-19 DIAGNOSIS — Z8611 Personal history of tuberculosis: Secondary | ICD-10-CM | POA: Diagnosis not present

## 2015-12-19 DIAGNOSIS — R51 Headache: Secondary | ICD-10-CM | POA: Diagnosis present

## 2015-12-19 DIAGNOSIS — Z8701 Personal history of pneumonia (recurrent): Secondary | ICD-10-CM | POA: Insufficient documentation

## 2015-12-19 DIAGNOSIS — G4730 Sleep apnea, unspecified: Secondary | ICD-10-CM | POA: Insufficient documentation

## 2015-12-19 DIAGNOSIS — Z880 Allergy status to penicillin: Secondary | ICD-10-CM | POA: Diagnosis not present

## 2015-12-19 DIAGNOSIS — M1990 Unspecified osteoarthritis, unspecified site: Secondary | ICD-10-CM | POA: Diagnosis not present

## 2015-12-19 MED ORDER — METOCLOPRAMIDE HCL 10 MG PO TABS
10.0000 mg | ORAL_TABLET | Freq: Once | ORAL | Status: AC
  Administered 2015-12-19: 10 mg via ORAL
  Filled 2015-12-19: qty 1

## 2015-12-19 MED ORDER — DIPHENHYDRAMINE HCL 50 MG/ML IJ SOLN
25.0000 mg | Freq: Once | INTRAMUSCULAR | Status: AC
  Administered 2015-12-19: 25 mg via INTRAVENOUS
  Filled 2015-12-19: qty 1

## 2015-12-19 MED ORDER — KETOROLAC TROMETHAMINE 15 MG/ML IJ SOLN
15.0000 mg | Freq: Once | INTRAMUSCULAR | Status: AC
  Administered 2015-12-19: 15 mg via INTRAVENOUS
  Filled 2015-12-19: qty 1

## 2015-12-19 MED ORDER — KETOROLAC TROMETHAMINE 15 MG/ML IJ SOLN: 15.0000 mg | Freq: Once | INTRAMUSCULAR | Status: DC

## 2015-12-19 MED ORDER — DIPHENHYDRAMINE HCL 25 MG PO CAPS: 25.0000 mg | ORAL_CAPSULE | Freq: Once | ORAL | Status: DC

## 2015-12-19 MED ORDER — SODIUM CHLORIDE 0.9 % IV BOLUS (SEPSIS)
1000.0000 mL | Freq: Once | INTRAVENOUS | Status: AC
Start: 2015-12-19 — End: 2015-12-19
  Administered 2015-12-19: 1000 mL via INTRAVENOUS

## 2015-12-19 NOTE — Discharge Instructions (Signed)

## 2015-12-19 NOTE — ED Provider Notes (Signed)
CSN: [id number]     Arrival date & time 12/19/15  69 History   First MD Initiated Contact with Patient 12/19/15 1612     Chief Complaint  Patient presents with  . Headache    HPI   40 year old female presents today with complaints of migraine. Patient reports a significant past medical history of the same, reports that she's been suffering for many years. Patient notes that she uses Relpax as prescribed by her primary care provider which usually improve her symptoms. Patient notes that 4 days ago she experienced a frontal migraine with light sensitivity similar to previous wrist pain she denies any change in small vision or tase, any neurological deficits, neck stiffness, fever, or any other concerning signs or symptoms. She reports that she took her Relpax which seemed to improve her symptoms. She notes that again the headache returned the next day again minor improvement in her symptoms. Patient notes that she also had slight tingling in the right side of her face and fingertips, she notes this is a typical side effect of migraine therapy. Patient notes that these are typical of migraines, but the frequency has increased. Patient notes that her migraines are triggered by stress, food, caffeine. She notes that this last week has been extremely stressful at work with [employer] nights excessive caffeine and poor by mouth intake. Patient attributes current migraine to the symptoms. She denies any history of head trauma.   Past Medical History  Diagnosis Date  . PONV (postoperative nausea and vomiting)   . Tuberculosis     TB + SKIN TEST   . Headache(784.0)     MIGRAINES   . Neuromuscular disorder ([place])     BLACKOUT SPELLS  NONE SINCE 1996   . Anxiety   . Depression   . Neck pain   . Pneumonia     hx of 3 years ago  . GERD (gastroesophageal reflux disease)     occasional  . Arthritis     "a little in Right knee"  . Sleep apnea     No CPAP   Past Surgical History  Procedure Laterality  Date  . Tympanostomy tube placement  1989, 1991, 1999, 2002, 2006, 2009, 2013  . Knee arthroscopy      1988   . Cesarean section      2004   . Adenoidectomy  1995    with tube placement  . Tonsillectomy  1983, 1985    ADENOID X2 (SEPARATE SURGERY)  TUBE PLACEMENT  . Knee arthroscopy Left 03/12/2014    Procedure: LEFT ARTHROSCOPY KNEE WITH DEBRIDEMENT;  Surgeon: Gearlean Alf, MD;  Location: [facility];  Service: Orthopedics;  Laterality: Left;  medical and lateral repair menius  . Laparoscopic ovarian cystectomy Right 10/09/2015    Procedure: LAPAROSCOPIC RIGHT OVARIAN CYSTECTOMIES, LEFT PARATUBAL CYSTECTOMY ;  Surgeon: Everett Graff, MD;  Location: [facility];  Service: Gynecology;  Laterality: Right;  . Dilitation & currettage/hystroscopy with novasure ablation N/A 10/09/2015    Procedure: DILATATION & CURETTAGE/HYSTEROSCOPY;  Surgeon: Everett Graff, MD;  Location: [facility];  Service: Gynecology;  Laterality: N/A;  . Chromopertubation Bilateral 10/09/2015    Procedure: CHROMOPERTUBATION;  Surgeon: Everett Graff, MD;  Location: [facility];  Service: Gynecology;  Laterality: Bilateral;   Family History  Problem Relation Age of Onset  . Diabetes Father   . Heart attack Father   . CAD Father   . Stroke Mother   . Diabetes Paternal Grandfather   . Heart attack Paternal Grandfather   .  Hypertension Paternal Grandfather   . Lung cancer Maternal Grandfather    Social History  Substance Use Topics  . Smoking status: Never Smoker   . Smokeless tobacco: Never Used  . Alcohol Use: Yes     Comment: OCC   OB History    No data available     Review of Systems  All other systems reviewed and are negative.   Allergies  Penicillins  Home Medications   Prior to Admission medications   Medication Sig Start Date End Date Taking? Authorizing Provider  ALPRAZolam Duanne Moron) 0.5 MG tablet Take 0.5 mg by mouth at bedtime as needed for sleep.     Historical Provider, MD  DULoxetine (CYMBALTA) 60 MG  capsule Take 60 mg by mouth daily.    Historical Provider, MD  eletriptan (RELPAX) 40 MG tablet One tablet by mouth at onset of headache. May repeat in 2 hours if headache persists or recurs.     Historical Provider, MD  ibuprofen (ADVIL,MOTRIN) 600 MG tablet 1 po pc every 6 hours for 5 days (first dose 5:30 pm today then prn-pain 10/09/15   Earnstine Regal, PA-C  methocarbamol (ROBAXIN) 500 MG tablet Take 1 tablet (500 mg total) by mouth 4 (four) times daily. Patient taking differently: Take 500 mg by mouth every 8 (eight) hours as needed for muscle spasms.  03/12/14   Gaynelle Arabian, MD  oxyCODONE-acetaminophen (PERCOCET/ROXICET) 5-325 MG tablet Take 1-2 tablets by mouth every 6 (six) hours as needed for severe pain. 10/09/15   Elmira Powell, PA-C  predniSONE (STERAPRED UNI-PAK) 10 MG tablet Take by mouth daily. Take as directed Patient not taking: Reported on 05/28/2015 12/16/14   Carlos Levering Draper, DO  topiramate (TOPAMAX) 100 MG tablet Take 50 mg by mouth 2 (two) times daily.     Historical Provider, MD  tranexamic acid (LYSTEDA) 650 mg TABS tablet Take 2 tablets (1,300 mg total) by mouth every 8 (eight) hours as needed. 10/09/15   Everett Graff, MD  traZODone (DESYREL) 50 MG tablet Take 50 mg by mouth at bedtime.    Historical Provider, MD   BP 147/77 mmHg  Pulse 100  Temp(Src) 97.9 F (36.6 C) (Oral)  Resp 18  Ht 5\' 1"  (1.549 m)  Wt 131.543 kg  BMI 54.82 kg/m2  SpO2 100%  LMP 12/19/2015   Physical Exam  Constitutional: She is oriented to person, place, and time. She appears well-developed and well-nourished.  HENT:  Head: Normocephalic and atraumatic.  Eyes: Conjunctivae are normal. Pupils are equal, round, and reactive to light. Right eye exhibits no discharge. Left eye exhibits no discharge. No scleral icterus.  Neck: Normal range of motion. No JVD present. No tracheal deviation present.  Pulmonary/Chest: Effort normal. No stridor.  Neurological: She is alert and oriented to person,  place, and time. She has normal strength. No cranial nerve deficit or sensory deficit. Coordination and gait normal. GCS eye subscore is 4. GCS verbal subscore is 5. GCS motor subscore is 6.  Psychiatric: She has a normal mood and affect. Her behavior is normal. Judgment and thought content normal.  Nursing note and vitals reviewed.   ED Course  Procedures (including critical care time) Labs Review Labs Reviewed - No data to display  Imaging Review No results found. I have personally reviewed and evaluated these images and lab results as part of my medical decision-making.   EKG Interpretation None      MDM   Final diagnoses:  Migraine without status migrainosus, not intractable, unspecified  migraine type    Labs:  Imaging:  Consults:  Therapeutics: Toradol, Reglan, Benadryl, normal saline  Discharge Meds:   Assessment/Plan: 40 year old female presents today with migraine. Patient has a history of the same, this is typical of her migraines. Patient notes identifiable exacerbating factors. Patient was treated in the ED with the above medications with good symptomatic improvement. Patient did not have complete resolution of symptoms. Patient had no neurological deficits during my evaluation, no headache red flags. Discussion of imaging studies for findings with patient, she agreed that no imaging was necessary at this time. Patient is instructed to go home rest, drink plenty fluids, avoid aggravating factors, and contact her primary care provider and inform them of today's visit. She is instructed to return immediately to emergency room if any new or worsening signs or symptoms present. Patient verbalized understanding and agreement to today's plan had no further questions concerned some discharge.        Okey Regal, PA-C 12/19/15 1734  Leonard Schwartz, MD 12/23/15 [phone]

## 2015-12-19 NOTE — ED Notes (Signed)
Patient states that she has had a Headache since Wed. Reports Nausea - denies vomiting. The patient reports that when she reaches for something or beds down it makes that pain worse, the patient reports that the pillow hurt her head yesterday.

## 2016-09-19 ENCOUNTER — Emergency Department (HOSPITAL_COMMUNITY): Payer: BC Managed Care – PPO

## 2016-09-19 ENCOUNTER — Encounter (HOSPITAL_COMMUNITY): Payer: Self-pay | Admitting: Emergency Medicine

## 2016-09-19 ENCOUNTER — Emergency Department (HOSPITAL_COMMUNITY)
Admission: EM | Admit: 2016-09-19 | Discharge: 2016-09-20 | Disposition: A | Discharge status: Home / Self Care | Payer: BC Managed Care – PPO | Attending: Emergency Medicine | Admitting: Emergency Medicine

## 2016-09-19 DIAGNOSIS — R0602 Shortness of breath: Secondary | ICD-10-CM | POA: Diagnosis present

## 2016-09-19 DIAGNOSIS — J40 Bronchitis, not specified as acute or chronic: Secondary | ICD-10-CM | POA: Diagnosis not present

## 2016-09-19 LAB — CBC WITH DIFFERENTIAL/PLATELET
Basophils Absolute: 0.1 10*3/uL (ref 0.0–0.1)
Basophils Relative: 1 %
Eosinophils Absolute: 0.3 10*3/uL (ref 0.0–0.7)
Eosinophils Relative: 2 %
HCT: 41.4 % (ref 36.0–46.0)
Hemoglobin: 14.1 g/dL (ref 12.0–15.0)
Lymphocytes Relative: 33 %
Lymphs Abs: 4.2 10*3/uL — ABNORMAL HIGH (ref 0.7–4.0)
MCH: 32.3 pg (ref 26.0–34.0)
MCHC: 34.1 g/dL (ref 30.0–36.0)
MCV: 94.7 fL (ref 78.0–100.0)
Monocytes Absolute: 1.1 10*3/uL — ABNORMAL HIGH (ref 0.1–1.0)
Monocytes Relative: 8 %
Neutro Abs: 7 10*3/uL (ref 1.7–7.7)
Neutrophils Relative %: 56 %
Platelets: 261 10*3/uL (ref 150–400)
RBC: 4.37 MIL/uL (ref 3.87–5.11)
RDW: 13.6 % (ref 11.5–15.5)
WBC: 12.5 10*3/uL — ABNORMAL HIGH (ref 4.0–10.5)

## 2016-09-19 LAB — BASIC METABOLIC PANEL
Anion gap: 5 (ref 5–15)
BUN: 13 mg/dL (ref 6–20)
CO2: 21 mmol/L — ABNORMAL LOW (ref 22–32)
Calcium: 8.9 mg/dL (ref 8.9–10.3)
Chloride: 113 mmol/L — ABNORMAL HIGH (ref 101–111)
Creatinine, Ser: 0.7 mg/dL (ref 0.44–1.00)
GFR calc Af Amer: >60 mL/min (ref >60)
GFR calc non Af Amer: >60 mL/min (ref >60)
Glucose, Bld: 96 mg/dL (ref 65–99)
Potassium: 4.1 mmol/L (ref 3.5–5.1)
Sodium: 139 mmol/L (ref 135–145)

## 2016-09-19 MED ORDER — ALBUTEROL SULFATE (2.5 MG/3ML) 0.083% IN NEBU
2.5000 mg | INHALATION_SOLUTION | Freq: Once | RESPIRATORY_TRACT | Status: AC
  Administered 2016-09-19: 2.5 mg via RESPIRATORY_TRACT
  Filled 2016-09-19: qty 3

## 2016-09-19 NOTE — ED Triage Notes (Signed)
Pt reports has had upper resp symptoms seen by urgent care 12/15 given Rx Levaquin then pt when on [place] 12[place]17 returned 12/24 and is now having increase pain with deep breathing as well as cough

## 2016-09-19 NOTE — ED Notes (Signed)
Pt reports feeling hot in the face, however reports the rest of the body feels warm. Temp checked: 98.5

## 2016-09-19 NOTE — ED Provider Notes (Signed)
[facility] DEPT Provider Note   CSN: [id number] Arrival date & time: 09/19/16  2207   By signing my name below, I, [name], attest that this documentation has been prepared under the direction and in the presence of Orpah Greek, MD. Electronically signed, [name], ED Scribe. 09/20/16. 12:16 AM.   History   Chief Complaint Chief Complaint  Patient presents with  . Shortness of Breath   The history is provided by the patient and a friend. No language interpreter was used.    HPI Comments: Tina Hunt is a 40 y.o. female with Hx of sinus infections who presents to the Emergency Department complaining of gradually worsening chest pain since ~09/02/2016. She reports Hx of multiple sinus infections treated with 3 different antibiotics in the past 2 months. Pt reports associated SOB and chest pain. She states her pain is exacerbated with deep breathing. She notes being diagnosed with 2 cases of walking pneumonia 07/2016. She notes gradually worsening congestion and cough leading up to a 12/15 urgent care visit. She was diagnosed with Pneumonia during the 09/09/2016 visit by the provider listening to wheezes and looking at the pt's recent diagnoses in her medical chart. She denies Hx of asthma.   Past Medical History:  Diagnosis Date  . Anxiety   . Arthritis    "a little in Right knee"  . Depression   . GERD (gastroesophageal reflux disease)    occasional  . Headache(784.0)    MIGRAINES   . Neck pain   . Neuromuscular disorder ([place])    BLACKOUT SPELLS  NONE SINCE 1996   . Pneumonia    hx of 3 years ago  . PONV (postoperative nausea and vomiting)   . Sleep apnea    No CPAP  . Tuberculosis    TB + SKIN TEST     Patient Active Problem List   Diagnosis Date Noted  . Acute medial meniscal tear 03/11/2014  . Eustachian tube dysfunction 11/28/2011    Past Surgical History:  Procedure Laterality Date  . ADENOIDECTOMY  1995   with tube placement    . CESAREAN SECTION     2004   . CHROMOPERTUBATION Bilateral 10/09/2015   Procedure: CHROMOPERTUBATION;  Surgeon: Everett Graff, MD;  Location: [facility];  Service: Gynecology;  Laterality: Bilateral;  . DILITATION & CURRETTAGE/HYSTROSCOPY WITH NOVASURE ABLATION N/A 10/09/2015   Procedure: DILATATION & CURETTAGE/HYSTEROSCOPY;  Surgeon: Everett Graff, MD;  Location: [facility];  Service: Gynecology;  Laterality: N/A;  . KNEE ARTHROSCOPY     1988   . KNEE ARTHROSCOPY Left 03/12/2014   Procedure: LEFT ARTHROSCOPY KNEE WITH DEBRIDEMENT;  Surgeon: Gearlean Alf, MD;  Location: [facility];  Service: Orthopedics;  Laterality: Left;  medical and lateral repair menius  . LAPAROSCOPIC OVARIAN CYSTECTOMY Right 10/09/2015   Procedure: LAPAROSCOPIC RIGHT OVARIAN CYSTECTOMIES, LEFT PARATUBAL CYSTECTOMY ;  Surgeon: Everett Graff, MD;  Location: [facility];  Service: Gynecology;  Laterality: Right;  . TONSILLECTOMY  1983, 1985   ADENOID X2 (SEPARATE SURGERY)  TUBE PLACEMENT  . [place], 1999, 2002, 2006, 2009, 2013    OB History    No data available       Home Medications    Prior to Admission medications   Medication Sig Start Date End Date Taking? Authorizing Provider  cyclobenzaprine (FLEXERIL) 10 MG tablet Take 10 mg by mouth at bedtime as needed for muscle spasms.   Yes Historical Provider, MD  diphenhydrAMINE (BENADRYL) 25 mg capsule  Take 25 mg by mouth every 6 (six) hours as needed for allergies.   Yes Historical Provider, MD  DULoxetine (CYMBALTA) 30 MG capsule Take 90 mg by mouth daily.   Yes Historical Provider, MD  eletriptan (RELPAX) 40 MG tablet One tablet by mouth at onset of headache. May repeat in 2 hours if headache persists or recurs.    Yes Historical Provider, MD  ibuprofen (ADVIL,MOTRIN) 200 MG tablet Take 800 mg by mouth every 6 (six) hours as needed for moderate pain.   Yes Historical Provider, MD  methocarbamol (ROBAXIN) 500 MG tablet Take 1 tablet (500 mg  total) by mouth 4 (four) times daily. Patient taking differently: Take 500 mg by mouth every 8 (eight) hours as needed for muscle spasms.  03/12/14  Yes Gaynelle Arabian, MD  norethindrone (AYGESTIN) 5 MG tablet Take 10 mg by mouth daily. 09/08/16  Yes Historical Provider, MD  oxyCODONE-acetaminophen (PERCOCET/ROXICET) 5-325 MG tablet Take 1-2 tablets by mouth every 6 (six) hours as needed for severe pain. 10/09/15  Yes Elmira Powell, PA-C  promethazine (PHENERGAN) 25 MG tablet Take 25 mg by mouth every 6 (six) hours as needed for nausea or vomiting.   Yes Historical Provider, MD  pseudoephedrine (SUDAFED) 60 MG tablet Take 60 mg by mouth every 4 (four) hours as needed for congestion.   Yes Historical Provider, MD  topiramate (TOPAMAX) 100 MG tablet Take 100 mg by mouth daily.    Yes Historical Provider, MD  traZODone (DESYREL) 50 MG tablet Take 50 mg by mouth at bedtime.   Yes Historical Provider, MD  benzonatate (TESSALON) 100 MG capsule Take 1 capsule (100 mg total) by mouth every 8 (eight) hours. 09/20/16   Orpah Greek, MD  ibuprofen (ADVIL,MOTRIN) 600 MG tablet 1 po pc every 6 hours for 5 days (first dose 5:30 pm today then prn-pain Patient not taking: Reported on 09/19/2016 10/09/15   Earnstine Regal, PA-C  predniSONE (DELTASONE) 20 MG tablet Take 2 tablets (40 mg total) by mouth daily with breakfast. 09/20/16   Orpah Greek, MD  tranexamic acid (LYSTEDA) 650 mg TABS tablet Take 2 tablets (1,300 mg total) by mouth every 8 (eight) hours as needed. Patient not taking: Reported on 09/19/2016 10/09/15   Everett Graff, MD    Family History Family History  Problem Relation Age of Onset  . Diabetes Father   . Heart attack Father   . CAD Father   . Stroke Mother   . Diabetes Paternal Grandfather   . Heart attack Paternal Grandfather   . Hypertension Paternal Grandfather   . Lung cancer Maternal Grandfather     Social History Social History  Substance Use Topics  . Smoking  status: Never Smoker  . Smokeless tobacco: Never Used  . Alcohol use Yes     Comment: OCC     Allergies   Biaxin [clarithromycin] and Penicillins   Review of Systems Review of Systems  All other systems reviewed and are negative.  A complete 10 system review of systems was obtained and all systems are negative except as noted in the HPI and PMH.    Physical Exam Updated Vital Signs BP 128/81   Pulse 79   Temp 99 F (37.2 C) (Oral)   Resp 18   Ht 5\' 1"  (1.549 m)   Wt (!) 315 lb (142.9 kg)   LMP 08/26/2016   SpO2 97%   BMI 59.52 kg/m   Physical Exam  Constitutional: She is oriented to person, place, and  time. She appears well-developed and well-nourished. No distress.  HENT:  Head: Normocephalic and atraumatic.  Right Ear: Hearing normal.  Left Ear: Hearing normal.  Nose: Nose normal.  Mouth/Throat: Oropharynx is clear and moist and mucous membranes are normal.  Eyes: Conjunctivae and EOM are normal. Pupils are equal, round, and reactive to light.  Neck: Normal range of motion. Neck supple.  Cardiovascular: Regular rhythm, S1 normal and S2 normal.  Exam reveals no gallop and no friction rub.   No murmur heard. Pulmonary/Chest: Effort normal and breath sounds normal. No respiratory distress. She has no wheezes. She has no rales. She exhibits no tenderness.  Abdominal: Soft. Normal appearance and bowel sounds are normal. There is no hepatosplenomegaly. There is no tenderness. There is no rebound, no guarding, no tenderness at McBurney's point and negative Murphy's sign. No hernia.  Musculoskeletal: Normal range of motion.  Neurological: She is alert and oriented to person, place, and time. She has normal strength. No cranial nerve deficit or sensory deficit. Coordination normal. GCS eye subscore is 4. GCS verbal subscore is 5. GCS motor subscore is 6.  Skin: Skin is warm, dry and intact. No rash noted. No cyanosis.  Psychiatric: She has a normal mood and affect. Her speech  is normal and behavior is normal. Thought content normal.  Nursing note and vitals reviewed.    ED Treatments / Results  DIAGNOSTIC STUDIES: Oxygen Saturation is 99% on RA, normal by my interpretation.    COORDINATION OF CARE: 12:16 AM Discussed treatment plan with pt at bedside and pt agreed to plan.  Labs (all labs ordered are listed, but only abnormal results are displayed) Labs Reviewed  CBC WITH DIFFERENTIAL/PLATELET - Abnormal; Notable for the following:       Result Value   WBC 12.5 (*)    Lymphs Abs 4.2 (*)    Monocytes Absolute 1.1 (*)    All other components within normal limits  BASIC METABOLIC PANEL - Abnormal; Notable for the following:    Chloride 113 (*)    CO2 21 (*)    All other components within normal limits    EKG  EKG Interpretation None       Radiology Dg Chest 2 View  Result Date: 09/19/2016 CLINICAL DATA:  Cough and shortness of breath. Diagnosed with pneumonia weeks prior, no improvement in symptoms after treatment. EXAM: CHEST  2 VIEW COMPARISON:  Chest radiograph 07/25/2014 FINDINGS: The cardiomediastinal contours are normal. The lungs are clear. Pulmonary vasculature is normal. No consolidation, pleural effusion, or pneumothorax. No acute osseous abnormalities are seen. There is degenerative change in the thoracic spine. IMPRESSION: No active cardiopulmonary disease. Electronically Signed   By: Jeb Levering M.D.   On: 09/19/2016 23:45    Procedures Procedures (including critical care time)  Medications Ordered in ED Medications  predniSONE (DELTASONE) tablet 60 mg (not administered)  albuterol (PROVENTIL HFA;VENTOLIN HFA) 108 (90 Base) MCG/ACT inhaler 2 puff (not administered)  albuterol (PROVENTIL) (2.5 MG/3ML) 0.083% nebulizer solution 2.5 mg (2.5 mg Nebulization Given 09/19/16 2334)     Initial Impression / Assessment and Plan / ED Course  I have reviewed the triage vital signs and the nursing notes.  Pertinent labs & imaging  results that were available during my care of the patient were reviewed by me and considered in my medical decision making (see chart for details).  Clinical Course     Pt symptoms consistent with URI. Has been sick for 1-1/2 weeks and has already been on antibiotic  coverage CXR negative for acute infiltrate. Symptoms consistent with bronchitis with mild bronchospasm. Pt will be discharged with symptomatic treatment.  Discussed return precautions.  Pt is hemodynamically stable & in NAD prior to discharge.   Final Clinical Impressions(s) / ED Diagnoses   Final diagnoses:  Bronchitis    New Prescriptions New Prescriptions   BENZONATATE (TESSALON) 100 MG CAPSULE    Take 1 capsule (100 mg total) by mouth every 8 (eight) hours.   PREDNISONE (DELTASONE) 20 MG TABLET    Take 2 tablets (40 mg total) by mouth daily with breakfast.  I personally performed the services described in this documentation, which was scribed in my presence. The recorded information has been reviewed and is accurate.    Orpah Greek, MD 09/20/16 [phone]

## 2016-09-20 MED ORDER — PREDNISONE 20 MG PO TABS
60.0000 mg | ORAL_TABLET | Freq: Once | ORAL | Status: AC
  Administered 2016-09-20: 60 mg via ORAL
  Filled 2016-09-20: qty 3

## 2016-09-20 MED ORDER — ALBUTEROL SULFATE HFA 108 (90 BASE) MCG/ACT IN AERS
2.0000 | INHALATION_SPRAY | RESPIRATORY_TRACT | Status: DC | PRN
  Administered 2016-09-20: 2 via RESPIRATORY_TRACT
  Filled 2016-09-20: qty 6.7

## 2016-09-20 MED ORDER — PREDNISONE 20 MG PO TABS: 40.0000 mg | ORAL_TABLET | Freq: Every day | ORAL | 0 refills | Status: DC

## 2016-09-20 MED ORDER — BENZONATATE 100 MG PO CAPS: 100.0000 mg | ORAL_CAPSULE | Freq: Three times a day (TID) | ORAL | 0 refills | Status: DC

## 2016-10-10 ENCOUNTER — Other Ambulatory Visit: Payer: Self-pay | Admitting: Physician Assistant

## 2016-10-10 DIAGNOSIS — J329 Chronic sinusitis, unspecified: Secondary | ICD-10-CM

## 2016-11-24 ENCOUNTER — Other Ambulatory Visit: Payer: BC Managed Care – PPO

## 2016-12-20 ENCOUNTER — Other Ambulatory Visit: Payer: Self-pay | Admitting: Obstetrics and Gynecology

## 2016-12-22 ENCOUNTER — Ambulatory Visit (INDEPENDENT_AMBULATORY_CARE_PROVIDER_SITE_OTHER): Payer: BC Managed Care – PPO | Admitting: Neurology

## 2016-12-22 ENCOUNTER — Encounter: Payer: Self-pay | Admitting: Neurology

## 2016-12-22 VITALS — BP 138/90 | HR 77 | Ht 61.0 in | Wt 330.0 lb

## 2016-12-22 DIAGNOSIS — G43819 Other migraine, intractable, without status migrainosus: Secondary | ICD-10-CM

## 2016-12-22 DIAGNOSIS — G43709 Chronic migraine without aura, not intractable, without status migrainosus: Secondary | ICD-10-CM | POA: Insufficient documentation

## 2016-12-22 DIAGNOSIS — IMO0002 Reserved for concepts with insufficient information to code with codable children: Secondary | ICD-10-CM

## 2016-12-22 DIAGNOSIS — G43909 Migraine, unspecified, not intractable, without status migrainosus: Secondary | ICD-10-CM | POA: Insufficient documentation

## 2016-12-22 MED ORDER — PROMETHAZINE HCL 25 MG PO TABS: 25.0000 mg | ORAL_TABLET | Freq: Four times a day (QID) | ORAL | 11 refills | Status: DC | PRN

## 2016-12-22 MED ORDER — RIZATRIPTAN BENZOATE 10 MG PO TBDP: 10.0000 mg | ORAL_TABLET | ORAL | 6 refills | Status: DC | PRN

## 2016-12-22 MED ORDER — SUMATRIPTAN SUCCINATE 6 MG/0.5ML ~~LOC~~ SOAJ: 6.0000 mg | Freq: Every evening | SUBCUTANEOUS | 11 refills | Status: DC | PRN

## 2016-12-22 MED ORDER — PROPRANOLOL HCL 60 MG PO TABS: 60.0000 mg | ORAL_TABLET | Freq: Two times a day (BID) | ORAL | 11 refills | Status: DC

## 2016-12-22 MED ORDER — BUPIVACAINE HCL (PF) 0.5 % IJ SOLN
10.0000 mL | Freq: Once | INTRAMUSCULAR | Status: AC
  Administered 2016-12-22: 10 mL

## 2016-12-22 NOTE — Progress Notes (Signed)
   History: Patient with history of chronic migraine headache, presented with recurrent headache over the last 2 days, failed to respond to Maxalt, Relpax, still complaining 7 out of 10 headache on the left side with severe light sensitivity, nauseous,  Bilateral occipital and trigeminal nerve block; trigger point injection of bilateral cervical and upper trapezius muscles for intractable headache  Bupivacaine 0.5% was injected on the scalp bilaterally at several locations:  -On the occipital area of the head, 3 injections each side, 0.5 cc per injection at the midpoint between the mastoid process and the occipital protuberance. 2 other injections were done one finger breadth from the initial injection, one at a 10 o'clock position and the other at a 2 o'clock position.  -2 injections of 0.5 cc were done in the temporal regions, 2 fingerbreadths above the tragus of the ear, with the second injection one fingerbreadth posteriorly to the first.  -2 injections were done on the brow, 1 in the medial brow and one over the supraorbital nerve notch, with 0.1 cc for each injection  -1 injection each side of 0.5 cc was done anterior to the tragus of the ear for a trigeminal ganglion injection  -0.5 cc was injected into bilateral upper trapezius and bilateral upper cervical paraspinals   The patient tolerated the injections well, no complications of the procedure were noted. Injections were made with a 27-gauge needle.

## 2016-12-22 NOTE — Progress Notes (Signed)
PATIENT: Tina Hunt DOB: March 02, 1976  Chief Complaint  Patient presents with  . Migraine    She was diagnosed with migraines in her early 39s.  Reports this month has been especially bad and she estimates having some type of headache daily.  She feels this is partly related to taking Aygestin.  She is scheduled for a total hysterectomy on 03/22/17.  She also feels weather has played a role in this increased frequency.  She is currently on Topamax 100mg , BID.  She has Relpax at home but feels Maxalt 10mg  tablets work better.  Marland Kitchen PCP     Harlan Stains, MD - referring MD  . Pain Management    Preferred Pain - [facility], [facility]     HISTORICAL  Tina Hunt is a 41 years old right-handed female, seen in refer by her primary care Harlan Stains for evaluation of chronic migraine, initial evaluation on December 22 2016.  She had past medical history of obesity, endometriosis, chronic pain, chronic narcotic treatment, I reviewed her registry, she is getting oxycodone/Tylenol 5/325 mg 90 tablets each months, Xanax 0.5 milligrams 30 tablets each months,  She reported a history of migraine since 2010, her typical migraine are triggered by weather change, altitude change, stress, sleep deprivation, hormonal change, starting from the left side of her neck spreading forward to become a left retro-orbital area severe pounding headache with associated light noise sensitivity, nauseous, lasting few hours to half day, her migraine sometimes up proceeded by blurry vision, sweaty sensation in her teeth.  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  She has been taking Topamax 100 mg twice a day for few years, with limited improvement,  I reviewed her report from the [facility]  system, a MRV of the brain was normal. CT head without contrast was normal  Endocrine evaluations in December 2017, normal BMP, CBC showed elevated WBC 12.5  hemoglobin of 14 point 1,  REVIEW OF SYSTEMS: Full 14 system review of systems performed and notable only for as above  ALLERGIES: Allergies  Allergen Reactions  . Biaxin [Clarithromycin] Diarrhea and Nausea And Vomiting  . Penicillins Hives    Has patient had a PCN reaction causing immediate rash, facial/tongue/throat swelling, SOB or lightheadedness with hypotension: No Has patient had a PCN reaction causing severe rash involving mucus membranes or skin necrosis: No Has patient had a PCN reaction that required hospitalization No Has patient had a PCN reaction occurring within the last 10 years: No If all of the above answers are "NO", then may proceed with Cephalosporin use.     HOME MEDICATIONS: Current Outpatient Prescriptions  Medication Sig Dispense Refill  . ALPRAZolam (XANAX) 0.5 MG tablet Take 0.5 mg by mouth as needed for anxiety.    . cyclobenzaprine (FLEXERIL) 10 MG tablet Take 10 mg by mouth at bedtime as needed for muscle spasms.    . diphenhydrAMINE (BENADRYL) 25 mg capsule Take 25 mg by mouth every 6 (six) hours as needed for allergies.    . DULoxetine (CYMBALTA) 30 MG capsule Take 90 mg by mouth daily.    Marland Kitchen eletriptan (RELPAX) 40 MG tablet One tablet by mouth at onset of headache. May repeat in 2 hours if headache persists or recurs.     Marland Kitchen ibuprofen (  ADVIL,MOTRIN) 200 MG tablet Take 800 mg by mouth every 6 (six) hours as needed for moderate pain.    . methocarbamol (ROBAXIN) 500 MG tablet Take 1 tablet (500 mg total) by mouth 4 (four) times daily. (Patient taking differently: Take 500 mg by mouth every 8 (eight) hours as needed for muscle spasms. ) 30 tablet 1  . norethindrone (AYGESTIN) 5 MG tablet Take 10 mg by mouth daily.    Marland Kitchen oxyCODONE-acetaminophen (PERCOCET/ROXICET) 5-325 MG tablet Take 1-2 tablets by mouth  every 6 (six) hours as needed for severe pain. 30 tablet 0  . predniSONE (DELTASONE) 20 MG tablet Take 2 tablets (40 mg total) by mouth daily with breakfast. 10 tablet 0  . pseudoephedrine (SUDAFED) 60 MG tablet Take 60 mg by mouth every 4 (four) hours as needed for congestion.    . topiramate (TOPAMAX) 100 MG tablet Take 100 mg by mouth 2 (two) times daily.     . traZODone (DESYREL) 50 MG tablet Take 200 mg by mouth at bedtime.      No current facility-administered medications for this visit.     PAST MEDICAL HISTORY: Past Medical History:  Diagnosis Date  . Anxiety   . Arthritis    "a little in Right knee"  . Depression   . Endometriosis   . GERD (gastroesophageal reflux disease)    occasional  . Headache(784.0)    MIGRAINES   . Migraine   . Neck pain   . Neuromuscular disorder ([place])    BLACKOUT SPELLS  NONE SINCE 1996   . Pneumonia    hx of 3 years ago  . PONV (postoperative nausea and vomiting)   . Sleep apnea    No CPAP  . 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 ADENOIDECTOMY  1995   with tube placement  . CESAREAN SECTION     2004   . CHROMOPERTUBATION Bilateral 10/09/2015   Procedure: CHROMOPERTUBATION;  Surgeon: Everett Graff, MD;  Location: [facility];  Service: Gynecology;  Laterality: Bilateral;  . DILITATION & CURRETTAGE/HYSTROSCOPY WITH NOVASURE ABLATION N/A 10/09/2015   Procedure: DILATATION & CURETTAGE/HYSTEROSCOPY;  Surgeon: Everett Graff, MD;  Location: [facility];  Service: Gynecology;  Laterality: N/A;  . KNEE ARTHROSCOPY     1988   . KNEE ARTHROSCOPY Left 03/12/2014   Procedure: LEFT ARTHROSCOPY KNEE WITH DEBRIDEMENT;  Surgeon: Gearlean Alf, MD;  Location: [facility];  Service: Orthopedics;  Laterality: Left;  medical and lateral repair menius  . LAPAROSCOPIC OVARIAN CYSTECTOMY Right 10/09/2015   Procedure: LAPAROSCOPIC RIGHT OVARIAN CYSTECTOMIES, LEFT PARATUBAL CYSTECTOMY ;  Surgeon:  Everett Graff, MD;  Location: [facility];  Service: Gynecology;  Laterality: Right;  . TONSILLECTOMY  1983, 1985   ADENOID X2 (SEPARATE SURGERY)  TUBE PLACEMENT  . TYMPANOSTOMY [place], 2002, 2006, 2009, 2013    FAMILY HISTORY: Family History  Problem Relation Age of Onset  . Diabetes Father   . Heart attack Father   . CAD Father   . Stroke Mother   . Diabetes Paternal Grandfather   . Heart attack Paternal Grandfather   . Hypertension Paternal Grandfather   . Lung cancer Maternal Grandfather     SOCIAL HISTORY:  Social History   Social History  . Marital status: Married    Spouse name: N/A  . Number of children: 1  . Years of education: Bachelors   Occupational History  She is a Pharmacist, hospital for [employer]     Social History Main Topics  . Smoking status: Never Smoker  . Smokeless tobacco: Never Used  . Alcohol use Yes     Comment: OCC  . Drug use: No   Sexual activity: Yes    Birth control/ protection: None   Other Topics Concern  . Not on file   Social History Narrative   Lives at home with husband and son.   Right-handed.   4-6 cups caffeine per day.        PHYSICAL EXAM   Vitals:   12/22/16 1056  BP: 138/90  Pulse: 77  Weight: (!) 330 lb (149.7 kg)  Height: 5\' 1"  (1.549 m)    Not recorded      Body mass index is 62.35 kg/m.  PHYSICAL EXAMNIATION:  Gen: NAD, conversant, well nourised, obese, well groomed                     Cardiovascular: Regular rate rhythm, no peripheral edema, warm, nontender. Eyes: Conjunctivae clear without exudates or hemorrhage Neck: Supple, no carotid bruits. Pulmonary: Clear to auscultation bilaterally   NEUROLOGICAL EXAM:  MENTAL STATUS: Speech:    Speech is normal; fluent and spontaneous with normal comprehension.  Cognition:     Orientation to time, place and person     Normal recent and remote memory     Normal Attention span and concentration     Normal Language,  naming, repeating,spontaneous speech     Fund of knowledge   CRANIAL NERVES: CN II: Visual fields are full to confrontation. Fundoscopic exam is normal with sharp discs and no vascular changes. Pupils are round equal and briskly reactive to light. CN III, IV, VI: extraocular movement are normal. No ptosis. CN V: Facial sensation is intact to pinprick in all 3 divisions bilaterally. Corneal responses are intact.  CN VII: Face is symmetric with normal eye closure and smile. CN VIII: Hearing is normal to rubbing fingers CN IX, X: Palate elevates symmetrically. Phonation is normal. CN XI: Head turning and shoulder shrug are intact CN XII: Tongue is midline with normal movements and no atrophy.  MOTOR: There is no pronator drift of out-stretched arms. Muscle bulk and tone are normal. Muscle strength is normal.  REFLEXES: Reflexes are 2+ and symmetric at the biceps, triceps, knees, and ankles. Plantar responses are flexor.  SENSORY: Intact to light touch, pinprick, positional sensation and vibratory sensation are intact in fingers and toes.  COORDINATION: Rapid alternating movements and fine finger movements are intact. There is no dysmetria on finger-to-nose and heel-knee-shin.    GAIT/STANCE: Posture is normal. Gait is steady with normal steps, base, arm swing, and turning. Heel and toe walking are normal. Tandem gait is normal.  Romberg is absent.   DIAGNOSTIC DATA (LABS, IMAGING, TESTING) - I reviewed patient records, labs, notes, testing and imaging myself where available.   ASSESSMENT AND PLAN  ISLAM VILLESCAS is a 41 y.o. female   Chronic migraine headaches Continue preventive medications Topamax 100 mg twice a day Add on propanolol 60 mg twice a day as preventive medications Maxalt as needed for moderate headaches, Imitrex subcutaneous injection, Phenergan, Aleve as needed for more severe headaches,  Protracted migraine headache today, failed to response to Relpax  today, We have performed nerve block at office today   Marcial Pacas, M.D. Ph.D.  [facility] [address] [place], [place] [address] Ph: [phone] Fax: [phone]  CC: Harlan Stains, MD

## 2016-12-28 ENCOUNTER — Telehealth: Payer: Self-pay | Admitting: *Deleted

## 2016-12-28 NOTE — Telephone Encounter (Signed)
PA approved by [facility] [phone]) through 12/29/2019 - [id number] - pt [id number].

## 2017-01-02 ENCOUNTER — Telehealth: Payer: Self-pay | Admitting: Neurology

## 2017-01-02 NOTE — Telephone Encounter (Signed)
Patient calling to schedule an appointment for a nerve block for headaches. She said would like to come in today.

## 2017-01-02 NOTE — Telephone Encounter (Signed)
Spoke to patient - she has only tried rizatriptan once and has not tried sumatriptan at all.  Recommended to repeat oral rizatriptan a second time or she can try her sumatriptan injections.  She is agreeable to this plan and will call back if headache persist.  Discussed with Dr. Krista Blue prior to the return call - she wanted to hold off on repeat nerve block and try home meds first.

## 2017-02-16 ENCOUNTER — Telehealth: Payer: Self-pay | Admitting: Neurology

## 2017-02-16 NOTE — Telephone Encounter (Signed)
Spoke to patient - migraine started last night and has failed to respond to home medications.  Per vo by Dr. Felecia Shelling, IV of VPA 1gram, Compazine 10mg , Toradol 30mg  offered to patient.  She declined the offer, stating that she feels stronger medications will be required to take care of this more severe migraine.  She has decided to seek treatment in the ED.

## 2017-02-16 NOTE — Telephone Encounter (Signed)
Pt calling stating she is having a terrible migraine and would like to know if anyway possible she can come for a migraine cocktail, she said if your calling before 4:30 please call work # if after 4:30 call cell #

## 2017-02-21 ENCOUNTER — Encounter: Payer: Self-pay | Admitting: Neurology

## 2017-02-21 ENCOUNTER — Telehealth: Payer: Self-pay | Admitting: Neurology

## 2017-02-21 ENCOUNTER — Ambulatory Visit (INDEPENDENT_AMBULATORY_CARE_PROVIDER_SITE_OTHER): Payer: BC Managed Care – PPO | Admitting: Neurology

## 2017-02-21 ENCOUNTER — Encounter (INDEPENDENT_AMBULATORY_CARE_PROVIDER_SITE_OTHER): Payer: Self-pay

## 2017-02-21 VITALS — BP 120/77 | HR 63 | Ht 61.0 in | Wt 335.0 lb

## 2017-02-21 DIAGNOSIS — G43819 Other migraine, intractable, without status migrainosus: Secondary | ICD-10-CM | POA: Diagnosis not present

## 2017-02-21 MED ORDER — TOPIRAMATE 100 MG PO TABS: 100.0000 mg | ORAL_TABLET | Freq: Two times a day (BID) | ORAL | 4 refills | Status: DC

## 2017-02-21 NOTE — Telephone Encounter (Signed)
Per Dr. Krista Blue: Wednesday afternoon for Botox injection as migraine prevention 4-5 weeks

## 2017-02-21 NOTE — Progress Notes (Signed)
PATIENT: Tina Hunt DOB: 1976-02-12  Chief Complaint  Patient presents with  . Migraine    Feels the addition of propranolol has not lessened her migraine frequency but says the intensity has improved.  They typically respond well to her home medications.  Reports having a severe migraine last week that required IV treatment in urgent care.      HISTORICAL  Tina Hunt is a 41 years old right-handed female, seen in refer by her primary care Harlan Stains for evaluation of chronic migraine, initial evaluation on December 22 2016.  She had past medical history of obesity, endometriosis, chronic pain, chronic narcotic treatment, I reviewed her registry, she is getting oxycodone/Tylenol 5/325 mg 90 tablets each month, Xanax 0.5 milligrams 30 tablets each months,  She reported a history of migraine since 2010, her typical migraine are triggered by weather change, altitude change, stress, sleep deprivation, hormonal change, starting from the left side of her neck spreading forward to become a left retro-orbital area severe pounding headache with associated light noise sensitivity, nauseous, lasting few hours to half day, her migraine sometimes up proceeded by blurry vision, sweaty sensation in her teeth.  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  She has been taking Topamax 100 mg twice a day for few years, with limited improvement,  I reviewed her report from the [facility] system, a MRV of the brain was normal. CT head without contrast was normal  Endocrine evaluations in December 2017, normal BMP, CBC showed elevated WBC 12.5  hemoglobin of 14 point 1,  UPDATE Feb 21 2017: She had nerve block on last visit December 22 2016 which has helped her headache, the  benefit lasts about 10 days, she is now taking propanolol 60 mg twice a day Topamax 100 mg twice a day, still has migraine headache about 3-4 times each week, especially with weather changes, her headache can last up to 2 days, she went to hospital for one prolonged migraine, Phenergan Benadryl, Depacon,  ketamine, cocktail has been helpful   She is now taking Maxalt together with Benadryl, Phenergan, sometimes she has to take Imitrex injection, and sleep usually helps  Today she complains of moderate headache for couple days,  REVIEW OF SYSTEMS: Full 14 system review of systems performed and notable only for as above  ALLERGIES: Allergies  Allergen Reactions  . Biaxin [Clarithromycin] Diarrhea and Nausea And Vomiting  . Penicillins Hives    Has patient had a PCN reaction causing immediate rash, facial/tongue/throat swelling, SOB or lightheadedness with hypotension: No Has patient had a PCN reaction causing severe rash involving mucus membranes or skin necrosis: No Has patient had a PCN reaction that required hospitalization No Has patient had a PCN reaction occurring within the last 10 years: No If all of the above answers are "NO", then may proceed with Cephalosporin use.     HOME MEDICATIONS: Current Outpatient Prescriptions  Medication Sig Dispense Refill  . ALPRAZolam (XANAX) 0.5 MG tablet Take 0.5 mg by mouth as needed for anxiety.    . cyclobenzaprine (FLEXERIL) 10 MG tablet Take 10 mg by mouth at bedtime as needed for muscle spasms.    . diphenhydrAMINE (BENADRYL) 25 mg capsule Take 25 mg by mouth every 6 (six) hours as needed for allergies.    Marland Kitchen  DULoxetine (CYMBALTA) 30 MG capsule Take 90 mg by mouth daily.    Marland Kitchen ibuprofen (ADVIL,MOTRIN) 200 MG tablet Take 800 mg by mouth every 6 (six) hours as needed for moderate pain.    . methocarbamol (ROBAXIN) 500 MG tablet Take 1 tablet (500 mg total) by mouth 4 (four) times daily. (Patient taking differently: Take 500 mg by mouth every 8  (eight) hours as needed for muscle spasms. ) 30 tablet 1  . norethindrone (AYGESTIN) 5 MG tablet Take 10 mg by mouth daily.    Marland Kitchen oxyCODONE-acetaminophen (PERCOCET/ROXICET) 5-325 MG tablet Take 1-2 tablets by mouth every 6 (six) hours as needed for severe pain. 30 tablet 0  . promethazine (PHENERGAN) 25 MG tablet Take 1 tablet (25 mg total) by mouth every 6 (six) hours as needed for nausea or vomiting. 30 tablet 11  . propranolol (INDERAL) 60 MG tablet Take 1 tablet (60 mg total) by mouth 2 (two) times daily. 60 tablet 11  . rizatriptan (MAXALT-MLT) 10 MG disintegrating tablet Take 1 tablet (10 mg total) by mouth as needed. May repeat in 2 hours if needed 15 tablet 6  . SUMAtriptan (IMITREX STATDOSE SYSTEM) 6 MG/0.5ML SOAJ Inject 6 mg into the skin at bedtime as needed. 12 Syringe 11  . topiramate (TOPAMAX) 100 MG tablet Take 100 mg by mouth 2 (two) times daily.     . traZODone (DESYREL) 50 MG tablet Take 200 mg by mouth at bedtime.      No current facility-administered medications for this visit.     PAST MEDICAL HISTORY: Past Medical History:  Diagnosis Date  . Anxiety   . Arthritis    "a little in Right knee"  . Depression   . Endometriosis   . GERD (gastroesophageal reflux disease)    occasional  . Headache(784.0)    MIGRAINES   . Migraine   . Neck pain   . Neuromuscular disorder ([place])    BLACKOUT SPELLS  NONE SINCE 1996   . Pneumonia    hx of 3 years ago  . PONV (postoperative nausea and vomiting)   . Sleep apnea    No CPAP  . 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 ADENOIDECTOMY  1995   with tube placement  . CESAREAN SECTION     2004   . CHROMOPERTUBATION Bilateral 10/09/2015   Procedure: CHROMOPERTUBATION;  Surgeon: Everett Graff, MD;  Location: [facility];  Service: Gynecology;  Laterality: Bilateral;  . DILITATION & CURRETTAGE/HYSTROSCOPY WITH NOVASURE ABLATION N/A 10/09/2015   Procedure:  DILATATION & CURETTAGE/HYSTEROSCOPY;  Surgeon: Everett Graff, MD;  Location: [facility];  Service: Gynecology;  Laterality: N/A;  . KNEE ARTHROSCOPY     1988   . KNEE ARTHROSCOPY Left 03/12/2014   Procedure: LEFT ARTHROSCOPY KNEE WITH DEBRIDEMENT;  Surgeon: Gearlean Alf, MD;  Location: [facility];  Service: Orthopedics;  Laterality: Left;  medical and lateral repair menius  . LAPAROSCOPIC OVARIAN CYSTECTOMY Right 10/09/2015   Procedure: LAPAROSCOPIC RIGHT OVARIAN CYSTECTOMIES, LEFT PARATUBAL CYSTECTOMY ;  Surgeon: Everett Graff, MD;  Location: [facility];  Service: Gynecology;  Laterality: Right;  . TONSILLECTOMY  1983, 1985   ADENOID X2 (SEPARATE SURGERY)  TUBE PLACEMENT  . TYMPANOSTOMY [place], 2002, 2006, 2009, 2013    FAMILY HISTORY: Family History  Problem Relation Age of Onset  . Diabetes Father   . Heart attack Father   .  CAD Father   . Stroke Mother   . Diabetes Paternal Grandfather   . Heart attack Paternal Grandfather   . Hypertension Paternal Grandfather   . Lung cancer Maternal Grandfather     SOCIAL HISTORY:  Social History   Social History  . Marital status: Married    Spouse name: N/A  . Number of children: 1  . Years of education: Bachelors   Occupational History  She is a Pharmacist, hospital for [employer]     Social History Main Topics  . Smoking status: Never Smoker  . Smokeless tobacco: Never Used  . Alcohol use Yes     Comment: OCC  . Drug use: No   Sexual activity: Yes    Birth control/ protection: None   Other Topics Concern  . Not on file   Social History Narrative   Lives at home with husband and son.   Right-handed.   4-6 cups caffeine per day.        PHYSICAL EXAM   Vitals:   02/21/17 1158  BP: 120/77  Pulse: 63  Weight: (!) 335 lb (152 kg)  Height: 5\' 1"  (1.549 m)    Not recorded      Body mass index is 63.3 kg/m.  PHYSICAL EXAMNIATION:  Gen: NAD, conversant, well nourised, obese, well groomed                      Cardiovascular: Regular rate rhythm, no peripheral edema, warm, nontender. Eyes: Conjunctivae clear without exudates or hemorrhage Neck: Supple, no carotid bruits. Pulmonary: Clear to auscultation bilaterally   NEUROLOGICAL EXAM:  MENTAL STATUS: Speech:    Speech is normal; fluent and spontaneous with normal comprehension.  Cognition:     Orientation to time, place and person     Normal recent and remote memory     Normal Attention span and concentration     Normal Language, naming, repeating,spontaneous speech     Fund of knowledge   CRANIAL NERVES: CN II: Visual fields are full to confrontation. Fundoscopic exam is normal with sharp discs and no vascular changes. Pupils are round equal and briskly reactive to light. CN III, IV, VI: extraocular movement are normal. No ptosis. CN V: Facial sensation is intact to pinprick in all 3 divisions bilaterally. Corneal responses are intact.  CN VII: Face is symmetric with normal eye closure and smile. CN VIII: Hearing is normal to rubbing fingers CN IX, X: Palate elevates symmetrically. Phonation is normal. CN XI: Head turning and shoulder shrug are intact CN XII: Tongue is midline with normal movements and no atrophy.  MOTOR: There is no pronator drift of out-stretched arms. Muscle bulk and tone are normal. Muscle strength is normal.  REFLEXES: Reflexes are 2+ and symmetric at the biceps, triceps, knees, and ankles. Plantar responses are flexor.  SENSORY: Intact to light touch, pinprick, positional sensation and vibratory sensation are intact in fingers and toes.  COORDINATION: Rapid alternating movements and fine finger movements are intact. There is no dysmetria on finger-to-nose and heel-knee-shin.    GAIT/STANCE: Posture is normal. Gait is steady with normal steps, base, arm swing, and turning. Heel and toe walking are normal. Tandem gait is normal.  Romberg is absent.   DIAGNOSTIC DATA (LABS, IMAGING,  TESTING) - I reviewed patient records, labs, notes, testing and imaging myself where available.   ASSESSMENT AND PLAN  ABRIAL ARRIGHI is a 41 y.o. female   Chronic migraine headaches Continue preventive medications Topamax 100  mg twice a day Add on propanolol 60 mg twice a day as preventive medications Maxalt as needed for moderate headaches, Imitrex subcutaneous injection, Phenergan, Aleve as needed for more severe headaches, We also started preauthorization for Botox as migraine prevention  Protracted migraine headache today, failed to response to Relpax   We have performed nerve block at office today   Marcial Pacas, M.D. Ph.D.  [facility] [address], [place], [place] [address] Ph: [phone] Fax: [phone]  CC: Harlan Stains, MD

## 2017-02-22 NOTE — Progress Notes (Signed)
   History:  Patient with history of chronic migraine headache presenting to his 2 days history of moderate headache, failed home remedy   Bilateral occipital and trigeminal nerve block; trigger point injection of bilateral cervical and upper trapezius muscles for intractable headache  Bupivacaine 0.25% was injected on the scalp bilaterally at several locations:  -On the occipital area of the head, 3 injections each side, 0.5 cc per injection at the midpoint between the mastoid process and the occipital protuberance. 2 other injections were done one finger breadth from the initial injection, one at a 10 o'clock position and the other at a 2 o'clock position.  -2 injections of 0.5 cc were done in the temporal regions, 2 fingerbreadths above the tragus of the ear, with the second injection one fingerbreadth posteriorly to the first.  -2 injections were done on the brow, 1 in the medial brow and one over the supraorbital nerve notch, with 0.1 cc for each injection  -1 injection each side of 0.5 cc was done anterior to the tragus of the ear for a trigeminal ganglion injection  -0.5 cc was injected into bilateral upper trapezius and bilateral upper cervical paraspinals   The patient tolerated the injections well, no complications of the procedure were noted. Injections were made with a 27-gauge needle.

## 2017-02-23 ENCOUNTER — Telehealth: Payer: Self-pay | Admitting: Neurology

## 2017-02-23 NOTE — Telephone Encounter (Signed)
I called to schedule botox injection with the patient, she did not answer and her VM was full.

## 2017-02-24 NOTE — Telephone Encounter (Signed)
I called to schedule the patient but she did not answer and her VM was full.  °

## 2017-03-10 NOTE — Patient Instructions (Addendum)
Your procedure is scheduled on:  Monday, June 25  Enter through the Main Entrance of [facility] at:  6:45 am  Pick up the phone at the desk and dial [phone].  Call this number if you have problems the morning of surgery: [phone].  Remember: Do NOT eat or drink clear liquids (including water) after midnight Sunday.  Take these medicines the morning of surgery with a SIP OF WATER:  Propranolol, topamax and xanax if needed.  Do Not smoke on the day of surgery.  Stop herbal medications and supplements at this time.  Do NOT wear jewelry (body piercing), metal hair clips/bobby pins, make-up, or nail polish. Do NOT wear lotions, powders, or perfumes.  You may wear deoderant. Do NOT shave for 48 hours prior to surgery. Do NOT bring valuables to the hospital. Contacts may not be worn into surgery.  Leave suitcase in car.  After surgery it may be brought to your room.  For patients admitted to the hospital, checkout time is 11:00 AM the day of discharge. Have a responsible adult drive you home and stay with you for 24 hours after your procedure.  Home with Sister Loree Fee cell [phone]

## 2017-03-13 ENCOUNTER — Other Ambulatory Visit: Payer: Self-pay

## 2017-03-13 ENCOUNTER — Encounter (HOSPITAL_COMMUNITY)
Admission: RE | Admit: 2017-03-13 | Discharge: 2017-03-13 | Disposition: A | Discharge status: Home / Self Care | Payer: BC Managed Care – PPO | Source: Ambulatory Visit | Attending: Obstetrics and Gynecology | Admitting: Obstetrics and Gynecology

## 2017-03-13 ENCOUNTER — Encounter (HOSPITAL_COMMUNITY): Payer: Self-pay

## 2017-03-13 DIAGNOSIS — Z01810 Encounter for preprocedural cardiovascular examination: Secondary | ICD-10-CM | POA: Diagnosis present

## 2017-03-13 DIAGNOSIS — Z01812 Encounter for preprocedural laboratory examination: Secondary | ICD-10-CM | POA: Diagnosis not present

## 2017-03-13 HISTORY — DX: Personal history of other medical treatment: Z92.89

## 2017-03-13 LAB — CBC
HCT: 42.5 % (ref 36.0–46.0)
Hemoglobin: 14.1 g/dL (ref 12.0–15.0)
MCH: 32 pg (ref 26.0–34.0)
MCHC: 33.2 g/dL (ref 30.0–36.0)
MCV: 96.4 fL (ref 78.0–100.0)
Platelets: 269 10*3/uL (ref 150–400)
RBC: 4.41 MIL/uL (ref 3.87–5.11)
RDW: 13.8 % (ref 11.5–15.5)
WBC: 8.8 10*3/uL (ref 4.0–10.5)

## 2017-03-13 LAB — BASIC METABOLIC PANEL
Anion gap: 5 (ref 5–15)
BUN: 10 mg/dL (ref 6–20)
CO2: 20 mmol/L — ABNORMAL LOW (ref 22–32)
Calcium: 8.7 mg/dL — ABNORMAL LOW (ref 8.9–10.3)
Chloride: 114 mmol/L — ABNORMAL HIGH (ref 101–111)
Creatinine, Ser: 0.79 mg/dL (ref 0.44–1.00)
GFR calc Af Amer: >60 mL/min (ref >60)
GFR calc non Af Amer: >60 mL/min (ref >60)
Glucose, Bld: 108 mg/dL — ABNORMAL HIGH (ref 65–99)
Potassium: 4 mmol/L (ref 3.5–5.1)
Sodium: 139 mmol/L (ref 135–145)

## 2017-03-13 LAB — ABO/RH: ABO/RH(D): O NEG

## 2017-03-13 LAB — TYPE AND SCREEN
ABO/RH(D): O NEG
Antibody Screen: NEGATIVE

## 2017-03-13 NOTE — Pre-Procedure Instructions (Signed)
Reviewed medical history/medicationos/EKG with Dr Jillyn Hidden.  [place] for surgery.  No orders given.  SDS BB History Log given to lab for blood transfusion 12/2002 at [facility].

## 2017-03-16 NOTE — H&P (Signed)
Tina Hunt is a 41 y.o. female  P: 1-0-0-1 who presents for hysterectomy because of chronic pelvic pain, adenomyosis and endometriosis. In January 2017 the patient underwent a right ovarian cystectomy for an endometrioma along hysteroscopy D & C for menorrhagia that revealed a benign polyp with no malignancy or hyperplasia on pathology. Following that procedure the patient began to have regular periods until August 2017 when she developed debilitating pelvic cramping during her monthly menstrual periods. This pain would not respond to any over the counter analgesia-only Percocet which decreased her pain from 10/10 (on a 10 point pain scale) to 7/10. Additionally these periods would last 9 days and require the change of a super plus tampons every hour. She was subsequently placed on norethindrone 5-10 mg daily to control both her bleeding and pain. Currently her periods consist of only spotting. Though Lupron was considered, the cost to the patient was prohibitive. With her bleeding controlled, the patient's pelvic pain has become more manageable. A pelvic ultrasound in July 2017 showed an anteverted uterus with the appearance of adenomyosis measuring: 5.62 x 5.35 x 5.6 cm, endometrium: 5.68 mm; left ovary: 2.09 cm and right ovary: not seen. The patient denies any change in bowel or bladder function, (though she has pelvic pain as her bladder fills ) but admits to dyspareunia, lower back pain and more discomfort on the right. A recommendation for an endometrial biopsy was given to the patient however she declined due to the discomfort associated with the procedure. Medical and surgical management options were given to the patient but after careful consideration, she has chosen to proceed with definitive therapy in the form of hysterectomy.    Past Medical History  OB History: G:1  P: 1-0-0-1;  C-section 2004  GYN History: menarche: 41 YO    LMP: see HPI    Contracepton no method  The patient denies  history of sexually transmitted disease.  Denies history of abnormal PAP smear.   Last PAP smear: 06/23/16-normal with negative HPV  Medical History: Endometriosis, Vitamin D Deficiency, Positive PPD, Anxiety, Depression, Migraine, Syncopal Episodes (as a teen), Sleep Apnea, Pneumonia. Left Knee Arthritis  Surgical History: 1985 Tonsillectomy; 1988 Left Knee Arthroscopy;   2013 Last of over 12 Tympanostomies;  2015 Left Knee Arthroscopy;  2017  Hysteroscopic Polypectomy, D & C with Laparoscopic Right Ovarian Cystectomy (endometrioma) Denies problems with anesthesia other than post operative nausea and vomiting.  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 Biaxin [Clarithromycin] Diarrhea and Nausea And Vomiting  . Penicillins Hives    Has patient had a PCN reaction causing immediate rash, facial/tongue/throat swelling, SOB or lightheadedness with hypotension: No Has patient had a PCN reaction causing severe rash involving mucus membranes or skin necrosis: No Has patient had a PCN reaction that required hospitalization No  Has patient had a PCN reaction occurring within the last 10 years: No If all of the above answers are "NO", then may proceed with Cephalosporin use.    Denies sensitivity to peanuts, shellfish, or soy.  But is sensitive to Latex and  Adhesives.  ROS: Admits to glasses, frequent migraine headaches (is seeing a Neurologist), nausea with migraines, chronic nasal congestion, but  denies  vision changes,  dysphagia, tinnitus, dizziness, hoarseness, cough,  chest pain, shortness of breath,  vomiting, diarrhea,constipation,  urinary frequency, urgency  dysuria, hematuria, vaginitis symptoms, swelling of joints,easy bruising,  myalgias, arthralgias, skin rashes, unexplained weight loss and except as is mentioned in the history of present illness, patient's review of systems is otherwise negative.   Physical Exam    Bp: 122/66     P: 80 bpm    R: 20  Temperature:  99.1 degrees F orally    Weight: 341 lbs.  Height: 5\' 1"   BMI: 64.4  Neck: supple without masses or thyromegaly Lungs: clear to auscultation Heart: regular rate and rhythm Abdomen: soft, non-tender and no organomegaly Pelvic: patient refused pelvic exam Extremities:  no clubbing, cyanosis or edema   Assesment: Chronic Pelvic Pain                      Endometriosis                      Adenomyosis   Disposition:  A discussion was held with patient regarding the indication for her procedure(s) along with the risks, which include but are not limited to: reaction to anesthesia, damage to adjacent organs, infection, excessive bleeding and the possible need for an open abdominal incision.  The patient verbalized understanding of these risks and has consented to proceed with a  Total Laparoscopic Hysterectomy, Bilateral Salpingectomy, Possible Right Oophorectomy and Cystoscopy at [place] on June 225, 2018.   CSN# [id number]   [place] J. Florene Glen, PA-C  for Dr. Harvie Bridge. Mancel Bale   Lengthy discussion in the office about whether to remove bilateral ovaries.  Pt's pain is primarily on the right side.  She desires to preserve some ovarian function and would like just the right ovary removed.  She understands she may need that removed in the future as well and  pain may persist.  Planned procedure TLH/RSO/LS/Cystoscopy/possible LAVH and possible TAH.  Risks benefits and alternatives reviewed including but not limited to bleeding infection and injury.  Consent signed and witnessed.  Questions answered.

## 2017-03-20 ENCOUNTER — Encounter (HOSPITAL_COMMUNITY): Payer: Self-pay | Admitting: *Deleted

## 2017-03-20 ENCOUNTER — Ambulatory Visit (HOSPITAL_COMMUNITY): Payer: BC Managed Care – PPO | Admitting: Certified Registered Nurse Anesthetist

## 2017-03-20 ENCOUNTER — Observation Stay (HOSPITAL_COMMUNITY)
Admission: RE | Admit: 2017-03-20 | Discharge: 2017-03-21 | Disposition: A | Discharge status: Home / Self Care | Payer: BC Managed Care – PPO | Source: Ambulatory Visit | Attending: Obstetrics and Gynecology | Admitting: Obstetrics and Gynecology

## 2017-03-20 ENCOUNTER — Encounter (HOSPITAL_COMMUNITY)
Admission: RE | Disposition: A | Discharge status: Home / Self Care | Payer: Self-pay | Source: Ambulatory Visit | Attending: Obstetrics and Gynecology

## 2017-03-20 DIAGNOSIS — Z79899 Other long term (current) drug therapy: Secondary | ICD-10-CM | POA: Insufficient documentation

## 2017-03-20 DIAGNOSIS — F329 Major depressive disorder, single episode, unspecified: Secondary | ICD-10-CM | POA: Diagnosis not present

## 2017-03-20 DIAGNOSIS — Z808 Family history of malignant neoplasm of other organs or systems: Secondary | ICD-10-CM | POA: Diagnosis not present

## 2017-03-20 DIAGNOSIS — N72 Inflammatory disease of cervix uteri: Secondary | ICD-10-CM | POA: Insufficient documentation

## 2017-03-20 DIAGNOSIS — Z833 Family history of diabetes mellitus: Secondary | ICD-10-CM | POA: Diagnosis not present

## 2017-03-20 DIAGNOSIS — N802 Endometriosis of fallopian tube: Secondary | ICD-10-CM | POA: Diagnosis not present

## 2017-03-20 DIAGNOSIS — E559 Vitamin D deficiency, unspecified: Secondary | ICD-10-CM | POA: Diagnosis not present

## 2017-03-20 DIAGNOSIS — F419 Anxiety disorder, unspecified: Secondary | ICD-10-CM | POA: Insufficient documentation

## 2017-03-20 DIAGNOSIS — R102 Pelvic and perineal pain: Secondary | ICD-10-CM | POA: Diagnosis not present

## 2017-03-20 DIAGNOSIS — K219 Gastro-esophageal reflux disease without esophagitis: Secondary | ICD-10-CM | POA: Diagnosis not present

## 2017-03-20 DIAGNOSIS — G4730 Sleep apnea, unspecified: Secondary | ICD-10-CM | POA: Insufficient documentation

## 2017-03-20 DIAGNOSIS — Z801 Family history of malignant neoplasm of trachea, bronchus and lung: Secondary | ICD-10-CM | POA: Insufficient documentation

## 2017-03-20 DIAGNOSIS — N888 Other specified noninflammatory disorders of cervix uteri: Secondary | ICD-10-CM | POA: Insufficient documentation

## 2017-03-20 DIAGNOSIS — Z6841 Body Mass Index (BMI) 40.0 and over, adult: Secondary | ICD-10-CM | POA: Insufficient documentation

## 2017-03-20 DIAGNOSIS — G8929 Other chronic pain: Secondary | ICD-10-CM | POA: Insufficient documentation

## 2017-03-20 DIAGNOSIS — D251 Intramural leiomyoma of uterus: Secondary | ICD-10-CM | POA: Insufficient documentation

## 2017-03-20 DIAGNOSIS — M1990 Unspecified osteoarthritis, unspecified site: Secondary | ICD-10-CM | POA: Diagnosis not present

## 2017-03-20 DIAGNOSIS — Z803 Family history of malignant neoplasm of breast: Secondary | ICD-10-CM | POA: Diagnosis not present

## 2017-03-20 DIAGNOSIS — N920 Excessive and frequent menstruation with regular cycle: Secondary | ICD-10-CM | POA: Diagnosis not present

## 2017-03-20 DIAGNOSIS — Z880 Allergy status to penicillin: Secondary | ICD-10-CM | POA: Insufficient documentation

## 2017-03-20 DIAGNOSIS — Z888 Allergy status to other drugs, medicaments and biological substances status: Secondary | ICD-10-CM | POA: Diagnosis not present

## 2017-03-20 DIAGNOSIS — Z800 Family history of malignant neoplasm of digestive organs: Secondary | ICD-10-CM | POA: Insufficient documentation

## 2017-03-20 DIAGNOSIS — N800 Endometriosis of uterus: Secondary | ICD-10-CM | POA: Insufficient documentation

## 2017-03-20 DIAGNOSIS — Z823 Family history of stroke: Secondary | ICD-10-CM | POA: Diagnosis not present

## 2017-03-20 DIAGNOSIS — Z8249 Family history of ischemic heart disease and other diseases of the circulatory system: Secondary | ICD-10-CM | POA: Insufficient documentation

## 2017-03-20 HISTORY — PX: TOTAL LAPAROSCOPIC HYSTERECTOMY WITH SALPINGECTOMY: SHX6742

## 2017-03-20 HISTORY — PX: CYSTOSCOPY: SHX5120

## 2017-03-20 LAB — PREGNANCY, URINE: Preg Test, Ur: NEGATIVE

## 2017-03-20 LAB — SURGICAL PCR SCREEN
MRSA, PCR: NEGATIVE
Staphylococcus aureus: POSITIVE — AB

## 2017-03-20 SURGERY — HYSTERECTOMY, TOTAL, LAPAROSCOPIC, WITH SALPINGECTOMY
Anesthesia: General | Laterality: Right

## 2017-03-20 MED ORDER — KETOROLAC TROMETHAMINE 30 MG/ML IJ SOLN
INTRAMUSCULAR | Status: DC | PRN
Start: 2017-03-20 — End: 2017-03-20
  Administered 2017-03-20: 30 mg via INTRAVENOUS

## 2017-03-20 MED ORDER — TOPIRAMATE 100 MG PO TABS
100.0000 mg | ORAL_TABLET | Freq: Two times a day (BID) | ORAL | Status: DC
  Administered 2017-03-20 – 2017-03-21 (×2): 100 mg via ORAL
  Filled 2017-03-20 (×4): qty 1

## 2017-03-20 MED ORDER — KETOROLAC TROMETHAMINE 30 MG/ML IJ SOLN
INTRAMUSCULAR | Status: AC
  Filled 2017-03-20: qty 1

## 2017-03-20 MED ORDER — MIDAZOLAM HCL 2 MG/2ML IJ SOLN
INTRAMUSCULAR | Status: AC
  Filled 2017-03-20: qty 2

## 2017-03-20 MED ORDER — DEXAMETHASONE SODIUM PHOSPHATE 10 MG/ML IJ SOLN
INTRAMUSCULAR | Status: AC
  Filled 2017-03-20: qty 1

## 2017-03-20 MED ORDER — ESTRADIOL 0.1 MG/GM VA CREA
TOPICAL_CREAM | VAGINAL | Status: AC
  Filled 2017-03-20: qty 42.5

## 2017-03-20 MED ORDER — HYDROMORPHONE HCL 1 MG/ML IJ SOLN
INTRAMUSCULAR | Status: DC | PRN
  Administered 2017-03-20 (×2): 1 mg via INTRAVENOUS

## 2017-03-20 MED ORDER — HEPARIN SODIUM (PORCINE) 5000 UNIT/ML IJ SOLN
5000.0000 [IU] | Freq: Three times a day (TID) | INTRAMUSCULAR | Status: DC
  Administered 2017-03-21 (×2): 5000 [IU] via SUBCUTANEOUS
  Filled 2017-03-20 (×5): qty 1

## 2017-03-20 MED ORDER — SODIUM CHLORIDE 0.9 % IJ SOLN
INTRAMUSCULAR | Status: DC | PRN
  Administered 2017-03-20: 10 mL

## 2017-03-20 MED ORDER — MENTHOL 3 MG MT LOZG: 1.0000 | LOZENGE | OROMUCOSAL | Status: DC | PRN

## 2017-03-20 MED ORDER — ONDANSETRON HCL 4 MG/2ML IJ SOLN
INTRAMUSCULAR | Status: DC | PRN
  Administered 2017-03-20 (×2): 2 mg via INTRAVENOUS

## 2017-03-20 MED ORDER — BUPIVACAINE HCL (PF) 0.25 % IJ SOLN
INTRAMUSCULAR | Status: AC
  Filled 2017-03-20: qty 30

## 2017-03-20 MED ORDER — SUCCINYLCHOLINE CHLORIDE 20 MG/ML IJ SOLN
INTRAMUSCULAR | Status: DC | PRN
  Administered 2017-03-20: 180 mg via INTRAVENOUS

## 2017-03-20 MED ORDER — MIDAZOLAM HCL 2 MG/2ML IJ SOLN
INTRAMUSCULAR | Status: DC | PRN
  Administered 2017-03-20: 0.5 mg via INTRAVENOUS
  Administered 2017-03-20: 1.5 mg via INTRAVENOUS

## 2017-03-20 MED ORDER — KETOROLAC TROMETHAMINE 30 MG/ML IJ SOLN
30.0000 mg | Freq: Four times a day (QID) | INTRAMUSCULAR | Status: AC
  Administered 2017-03-20 – 2017-03-21 (×3): 30 mg via INTRAVENOUS
  Filled 2017-03-20 (×2): qty 1

## 2017-03-20 MED ORDER — LACTATED RINGERS IV SOLN
INTRAVENOUS | Status: DC
  Administered 2017-03-20 (×2): via INTRAVENOUS

## 2017-03-20 MED ORDER — PROPOFOL 10 MG/ML IV BOLUS
INTRAVENOUS | Status: AC
  Filled 2017-03-20: qty 20

## 2017-03-20 MED ORDER — MEPERIDINE HCL 25 MG/ML IJ SOLN: 6.2500 mg | INTRAMUSCULAR | Status: DC | PRN

## 2017-03-20 MED ORDER — IBUPROFEN 600 MG PO TABS
600.0000 mg | ORAL_TABLET | Freq: Four times a day (QID) | ORAL | Status: DC | PRN
  Administered 2017-03-21: 600 mg via ORAL
  Filled 2017-03-20: qty 1

## 2017-03-20 MED ORDER — HYDROMORPHONE HCL 1 MG/ML IJ SOLN
0.2500 mg | INTRAMUSCULAR | Status: DC | PRN
  Administered 2017-03-20: 0.25 mg via INTRAVENOUS

## 2017-03-20 MED ORDER — ALPRAZOLAM 0.5 MG PO TABS
0.5000 mg | ORAL_TABLET | Freq: Two times a day (BID) | ORAL | Status: DC | PRN
  Administered 2017-03-21: 0.5 mg via ORAL
  Filled 2017-03-20: qty 1

## 2017-03-20 MED ORDER — FENTANYL CITRATE (PF) 100 MCG/2ML IJ SOLN
INTRAMUSCULAR | Status: DC | PRN
  Administered 2017-03-20: 100 ug via INTRAVENOUS
  Administered 2017-03-20 (×2): 50 ug via INTRAVENOUS
  Administered 2017-03-20: 100 ug via INTRAVENOUS
  Administered 2017-03-20: 50 ug via INTRAVENOUS
  Administered 2017-03-20: 100 ug via INTRAVENOUS

## 2017-03-20 MED ORDER — SUMATRIPTAN SUCCINATE 100 MG PO TABS
100.0000 mg | ORAL_TABLET | ORAL | Status: DC | PRN
  Filled 2017-03-20: qty 1

## 2017-03-20 MED ORDER — ROCURONIUM BROMIDE 100 MG/10ML IV SOLN
INTRAVENOUS | Status: DC | PRN
  Administered 2017-03-20: 10 mg via INTRAVENOUS
  Administered 2017-03-20: 20 mg via INTRAVENOUS
  Administered 2017-03-20: 30 mg via INTRAVENOUS
  Administered 2017-03-20: 10 mg via INTRAVENOUS
  Administered 2017-03-20: 20 mg via INTRAVENOUS
  Administered 2017-03-20: 10 mg via INTRAVENOUS

## 2017-03-20 MED ORDER — BUPIVACAINE HCL (PF) 0.25 % IJ SOLN
INTRAMUSCULAR | Status: DC | PRN
  Administered 2017-03-20: 30 mL

## 2017-03-20 MED ORDER — SUGAMMADEX SODIUM 500 MG/5ML IV SOLN
INTRAVENOUS | Status: DC | PRN
  Administered 2017-03-20: 400 mg via INTRAVENOUS

## 2017-03-20 MED ORDER — HYDROMORPHONE HCL 1 MG/ML IJ SOLN
INTRAMUSCULAR | Status: AC
  Filled 2017-03-20: qty 2

## 2017-03-20 MED ORDER — GENTAMICIN SULFATE 40 MG/ML IJ SOLN
INTRAVENOUS | Status: AC
  Administered 2017-03-20: 117.25 mL via INTRAVENOUS
  Filled 2017-03-20: qty 11.25

## 2017-03-20 MED ORDER — DEXAMETHASONE SODIUM PHOSPHATE 10 MG/ML IJ SOLN
INTRAMUSCULAR | Status: DC | PRN
  Administered 2017-03-20: 10 mg via INTRAVENOUS

## 2017-03-20 MED ORDER — CYCLOBENZAPRINE HCL 10 MG PO TABS
10.0000 mg | ORAL_TABLET | Freq: Every evening | ORAL | Status: DC | PRN
  Administered 2017-03-20: 10 mg via ORAL
  Filled 2017-03-20 (×2): qty 1

## 2017-03-20 MED ORDER — NEOSTIGMINE METHYLSULFATE 10 MG/10ML IV SOLN
INTRAVENOUS | Status: AC
  Filled 2017-03-20: qty 1

## 2017-03-20 MED ORDER — SCOPOLAMINE 1 MG/3DAYS TD PT72
MEDICATED_PATCH | TRANSDERMAL | Status: AC
  Administered 2017-03-20: 1.5 mg via TRANSDERMAL
  Filled 2017-03-20: qty 1

## 2017-03-20 MED ORDER — RIZATRIPTAN BENZOATE 10 MG PO TBDP
10.0000 mg | ORAL_TABLET | ORAL | Status: DC | PRN
  Filled 2017-03-20: qty 1

## 2017-03-20 MED ORDER — METHYLENE BLUE 0.5 % INJ SOLN
INTRAVENOUS | Status: AC
  Filled 2017-03-20: qty 10

## 2017-03-20 MED ORDER — SUGAMMADEX SODIUM 500 MG/5ML IV SOLN
INTRAVENOUS | Status: AC
  Filled 2017-03-20: qty 5

## 2017-03-20 MED ORDER — HYDROMORPHONE 1 MG/ML IV SOLN
INTRAVENOUS | Status: DC
  Administered 2017-03-20: 3.6 mg via INTRAVENOUS
  Administered 2017-03-20: 15:00:00 via INTRAVENOUS
  Administered 2017-03-21: 2.6 mg via INTRAVENOUS
  Administered 2017-03-21: 3.3 mg via INTRAVENOUS
  Administered 2017-03-21: 0.9 mg via INTRAVENOUS
  Filled 2017-03-20: qty 25

## 2017-03-20 MED ORDER — ONDANSETRON HCL 4 MG PO TABS: 4.0000 mg | ORAL_TABLET | Freq: Three times a day (TID) | ORAL | Status: DC | PRN

## 2017-03-20 MED ORDER — LIDOCAINE HCL (CARDIAC) 20 MG/ML IV SOLN
INTRAVENOUS | Status: AC
  Filled 2017-03-20: qty 5

## 2017-03-20 MED ORDER — HYDROMORPHONE HCL 1 MG/ML IJ SOLN
INTRAMUSCULAR | Status: AC
  Filled 2017-03-20: qty 0.5

## 2017-03-20 MED ORDER — ONDANSETRON HCL 4 MG/2ML IJ SOLN: 4.0000 mg | Freq: Four times a day (QID) | INTRAMUSCULAR | Status: DC | PRN

## 2017-03-20 MED ORDER — SCOPOLAMINE 1 MG/3DAYS TD PT72
1.0000 | MEDICATED_PATCH | Freq: Once | TRANSDERMAL | Status: DC
  Administered 2017-03-20: 1.5 mg via TRANSDERMAL

## 2017-03-20 MED ORDER — MIDAZOLAM HCL 2 MG/2ML IJ SOLN: 0.5000 mg | Freq: Once | INTRAMUSCULAR | Status: DC | PRN

## 2017-03-20 MED ORDER — FENTANYL CITRATE (PF) 250 MCG/5ML IJ SOLN
INTRAMUSCULAR | Status: AC
  Filled 2017-03-20: qty 5

## 2017-03-20 MED ORDER — ONDANSETRON HCL 4 MG/2ML IJ SOLN
INTRAMUSCULAR | Status: AC
  Filled 2017-03-20: qty 2

## 2017-03-20 MED ORDER — GLYCOPYRROLATE 0.2 MG/ML IJ SOLN
INTRAMUSCULAR | Status: DC | PRN
  Administered 2017-03-20 (×2): 0.1 mg via INTRAVENOUS

## 2017-03-20 MED ORDER — STERILE WATER FOR IRRIGATION IR SOLN
Status: DC | PRN
  Administered 2017-03-20: 1000 mL via INTRAVESICAL

## 2017-03-20 MED ORDER — GLYCOPYRROLATE 0.2 MG/ML IJ SOLN
INTRAMUSCULAR | Status: AC
  Filled 2017-03-20: qty 3

## 2017-03-20 MED ORDER — SODIUM CHLORIDE 0.9% FLUSH: 9.0000 mL | INTRAVENOUS | Status: DC | PRN

## 2017-03-20 MED ORDER — ROCURONIUM BROMIDE 100 MG/10ML IV SOLN
INTRAVENOUS | Status: AC
  Filled 2017-03-20: qty 1

## 2017-03-20 MED ORDER — LACTATED RINGERS IV SOLN
INTRAVENOUS | Status: DC
  Administered 2017-03-20 (×2): via INTRAVENOUS

## 2017-03-20 MED ORDER — OXYCODONE-ACETAMINOPHEN 5-325 MG PO TABS
1.0000 | ORAL_TABLET | ORAL | Status: DC | PRN
  Administered 2017-03-21 (×2): 2 via ORAL
  Filled 2017-03-20 (×2): qty 2

## 2017-03-20 MED ORDER — LACTATED RINGERS IR SOLN
Status: DC | PRN
  Administered 2017-03-20: 3000 mL

## 2017-03-20 MED ORDER — VASOPRESSIN 20 UNIT/ML IV SOLN
INTRAVENOUS | Status: AC
  Filled 2017-03-20: qty 1

## 2017-03-20 MED ORDER — TRAZODONE HCL 100 MG PO TABS
100.0000 mg | ORAL_TABLET | Freq: Every day | ORAL | Status: DC
  Administered 2017-03-20: 100 mg via ORAL
  Filled 2017-03-20 (×2): qty 1

## 2017-03-20 MED ORDER — DULOXETINE HCL 60 MG PO CPEP
90.0000 mg | ORAL_CAPSULE | Freq: Every day | ORAL | Status: DC
  Filled 2017-03-20 (×2): qty 1

## 2017-03-20 MED ORDER — DIPHENHYDRAMINE HCL 12.5 MG/5ML PO ELIX
12.5000 mg | ORAL_SOLUTION | Freq: Four times a day (QID) | ORAL | Status: DC | PRN
  Administered 2017-03-20: 12.5 mg via ORAL
  Filled 2017-03-20: qty 5

## 2017-03-20 MED ORDER — DIPHENHYDRAMINE HCL 50 MG/ML IJ SOLN: 12.5000 mg | Freq: Four times a day (QID) | INTRAMUSCULAR | Status: DC | PRN

## 2017-03-20 MED ORDER — PROPOFOL 10 MG/ML IV BOLUS
INTRAVENOUS | Status: DC | PRN
Start: 2017-03-20 — End: 2017-03-20
  Administered 2017-03-20: 300 mg via INTRAVENOUS

## 2017-03-20 MED ORDER — DOCUSATE SODIUM 100 MG PO CAPS
100.0000 mg | ORAL_CAPSULE | Freq: Two times a day (BID) | ORAL | Status: DC
  Administered 2017-03-20 – 2017-03-21 (×3): 100 mg via ORAL
  Filled 2017-03-20 (×3): qty 1

## 2017-03-20 MED ORDER — SODIUM CHLORIDE 0.9 % IJ SOLN
INTRAMUSCULAR | Status: AC
  Filled 2017-03-20: qty 10

## 2017-03-20 MED ORDER — LIDOCAINE HCL (CARDIAC) 20 MG/ML IV SOLN
INTRAVENOUS | Status: DC | PRN
  Administered 2017-03-20: 20 mg via INTRAVENOUS

## 2017-03-20 MED ORDER — PROMETHAZINE HCL 25 MG/ML IJ SOLN: 6.2500 mg | INTRAMUSCULAR | Status: DC | PRN

## 2017-03-20 MED ORDER — PROPRANOLOL HCL 60 MG PO TABS
60.0000 mg | ORAL_TABLET | Freq: Two times a day (BID) | ORAL | Status: DC
  Administered 2017-03-20 – 2017-03-21 (×2): 60 mg via ORAL
  Filled 2017-03-20 (×4): qty 1

## 2017-03-20 MED ORDER — METHYLENE BLUE 0.5 % INJ SOLN
INTRAVENOUS | Status: DC | PRN
  Administered 2017-03-20: 5 mL via INTRAVENOUS

## 2017-03-20 MED ORDER — FENTANYL CITRATE (PF) 100 MCG/2ML IJ SOLN
INTRAMUSCULAR | Status: AC
  Filled 2017-03-20: qty 2

## 2017-03-20 MED ORDER — NALOXONE HCL 0.4 MG/ML IJ SOLN: 0.4000 mg | INTRAMUSCULAR | Status: DC | PRN

## 2017-03-20 SURGICAL SUPPLY — 51 items
ADH SKN CLS APL DERMABOND .7 (GAUZE/BANDAGES/DRESSINGS) ×2
ADH SKN CLS LQ APL DERMABOND (GAUZE/BANDAGES/DRESSINGS) ×2
BARRIER ADHS 3X4 INTERCEED (GAUZE/BANDAGES/DRESSINGS) IMPLANT
BRR ADH 4X3 ABS CNTRL BYND (GAUZE/BANDAGES/DRESSINGS)
CANISTER SUCT 3000ML PPV (MISCELLANEOUS) ×3 IMPLANT
CLOTH BEACON ORANGE TIMEOUT ST (SAFETY) ×3 IMPLANT
CONT PATH 16OZ SNAP LID 3702 (MISCELLANEOUS) ×3 IMPLANT
COVER BACK TABLE 60X90IN (DRAPES) ×3 IMPLANT
DECANTER SPIKE VIAL GLASS SM (MISCELLANEOUS) ×1 IMPLANT
DERMABOND ADHESIVE PROPEN (GAUZE/BANDAGES/DRESSINGS) ×1
DERMABOND ADVANCED (GAUZE/BANDAGES/DRESSINGS) ×1
DERMABOND ADVANCED .7 DNX12 (GAUZE/BANDAGES/DRESSINGS) ×2 IMPLANT
DERMABOND ADVANCED .7 DNX6 (GAUZE/BANDAGES/DRESSINGS) IMPLANT
DRAPE ROBOTICS STRL (DRAPES) ×1 IMPLANT
DRSG OPSITE POSTOP 3X4 (GAUZE/BANDAGES/DRESSINGS) ×3 IMPLANT
DURAPREP 26ML APPLICATOR (WOUND CARE) ×5 IMPLANT
ELECT REM PT RETURN 9FT ADLT (ELECTROSURGICAL) ×3
ELECTRODE REM PT RTRN 9FT ADLT (ELECTROSURGICAL) ×2 IMPLANT
FILTER SMOKE EVAC LAPAROSHD (FILTER) ×5 IMPLANT
GAUZE PACKING 2X5 YD STRL (GAUZE/BANDAGES/DRESSINGS) IMPLANT
GLOVE BIO SURGEON STRL SZ7.5 (GLOVE) ×6 IMPLANT
GLOVE BIOGEL PI IND STRL 7.0 (GLOVE) ×4 IMPLANT
GLOVE BIOGEL PI IND STRL 7.5 (GLOVE) ×6 IMPLANT
GLOVE BIOGEL PI INDICATOR 7.0 (GLOVE) ×3
GLOVE BIOGEL PI INDICATOR 7.5 (GLOVE) ×3
HEMOSTAT SURGICEL 2X14 (HEMOSTASIS) ×1 IMPLANT
NEEDLE HYPO 22GX1.5 SAFETY (NEEDLE) ×1 IMPLANT
NEEDLE INSUFFLATION 150MM (ENDOMECHANICALS) ×1 IMPLANT
OCCLUDER COLPOPNEUMO (BALLOONS) ×3 IMPLANT
PACK LAVH (CUSTOM PROCEDURE TRAY) ×3 IMPLANT
PACK ROBOTIC GOWN (GOWN DISPOSABLE) ×15 IMPLANT
PACK TRENDGUARD 600 HYBRD PROC (MISCELLANEOUS) IMPLANT
PAD OB MATERNITY 4.3X12.25 (PERSONAL CARE ITEMS) ×3 IMPLANT
PROTECTOR NERVE ULNAR (MISCELLANEOUS) ×6 IMPLANT
SET CYSTO W/LG BORE CLAMP LF (SET/KITS/TRAYS/PACK) ×3 IMPLANT
SET IRRIG TUBING LAPAROSCOPIC (IRRIGATION / IRRIGATOR) ×3 IMPLANT
SHEARS HARMONIC ACE PLUS 36CM (ENDOMECHANICALS) ×1 IMPLANT
SLEEVE XCEL OPT CAN 5 100 (ENDOMECHANICALS) ×3 IMPLANT
SUT MNCRL AB 3-0 PS2 27 (SUTURE) ×7 IMPLANT
SUT PDS AB 1 CT1 36 (SUTURE) ×5 IMPLANT
SUT VICRYL 0 UR6 27IN ABS (SUTURE) ×10 IMPLANT
SYR 20CC LL (SYRINGE) ×1 IMPLANT
SYR 50ML LL SCALE MARK (SYRINGE) ×3 IMPLANT
SYR CONTROL 10ML LL (SYRINGE) ×1 IMPLANT
TIP UTERINE 6.7X8CM BLUE DISP (MISCELLANEOUS) ×1 IMPLANT
TOWEL OR 17X24 6PK STRL BLUE (TOWEL DISPOSABLE) ×6 IMPLANT
TRAY FOLEY CATH SILVER 14FR (SET/KITS/TRAYS/PACK) ×3 IMPLANT
TRENDGUARD 600 HYBRID PROC PK (MISCELLANEOUS) ×3
TROCAR XCEL NON-BLD 11X100MML (ENDOMECHANICALS) ×1 IMPLANT
TROCAR XCEL NON-BLD 5MMX100MML (ENDOMECHANICALS) ×3 IMPLANT
WARMER LAPAROSCOPE (MISCELLANEOUS) ×3 IMPLANT

## 2017-03-20 NOTE — Anesthesia Postprocedure Evaluation (Signed)
Anesthesia Post Note  Patient: Tina Hunt  Procedure(s) Performed: Procedure(s) (LRB): HYSTERECTOMY TOTAL LAPAROSCOPIC WITH BILATERAL SALPINGECTOMY (Bilateral) CYSTOSCOPY (Bilateral) LAPAROSCOPIC RIGHT OOPHORECTOMY (Right)     Patient location during evaluation: PACU Anesthesia Type: General Level of consciousness: awake and alert, oriented and patient cooperative Pain management: pain level controlled Vital Signs Assessment: post-procedure vital signs reviewed and stable Respiratory status: spontaneous breathing, nonlabored ventilation, respiratory function stable and patient connected to nasal cannula oxygen Cardiovascular status: blood pressure returned to baseline and stable Postop Assessment: no signs of nausea or vomiting Anesthetic complications: no    Last Vitals:  Vitals:   03/20/17 1445 03/20/17 1500  BP: 108/61 (!) 139/53  Pulse: 84 82  Resp: 14 14  Temp: 36.7 C 36.9 C    Last Pain:  Vitals:   03/20/17 1500  TempSrc:   PainSc: 6    Pain Goal: Patients Stated Pain Goal: 3 (03/20/17 1500)               Kaziah Krizek,E. Terianne Thaker

## 2017-03-20 NOTE — Anesthesia Procedure Notes (Deleted)
Performed by: Aileana Hodder L       

## 2017-03-20 NOTE — Progress Notes (Signed)
Day of Surgery Procedure(s) (LRB): HYSTERECTOMY TOTAL LAPAROSCOPIC WITH BILATERAL SALPINGECTOMY (Bilateral) CYSTOSCOPY (Bilateral) LAPAROSCOPIC RIGHT OOPHORECTOMY (Right)  Subjective: Patient reports tolerating PO.    Objective: I have reviewed patient's vital signs and intake and output.  General: alert and no distress Resp: clear to auscultation bilaterally Cardio: regular rate and rhythm GI: soft, non-tender; bowel sounds normal; no masses,  no organomegaly Extremities: Homans sign is negative, no sign of DVT Vaginal Bleeding: none  Assessment: s/p Procedure(s): HYSTERECTOMY TOTAL LAPAROSCOPIC WITH BILATERAL SALPINGECTOMY (Bilateral) CYSTOSCOPY (Bilateral) LAPAROSCOPIC RIGHT OOPHORECTOMY (Right): stable  Plan: CBC in am\ Encourage IS SCDs and SQ heparin for DVT prophylaxis Routine post op care  LOS: 0 days    Beckam Abdulaziz Y 03/20/2017, 7:36 PM

## 2017-03-20 NOTE — Transfer of Care (Signed)
Immediate Anesthesia Transfer of Care Note  Patient: Tina Hunt  Procedure(s) Performed: Procedure(s): HYSTERECTOMY TOTAL LAPAROSCOPIC WITH BILATERAL SALPINGECTOMY (Bilateral) CYSTOSCOPY (Bilateral) LAPAROSCOPIC RIGHT OOPHORECTOMY (Right)  Patient Location: PACU  Anesthesia Type:General  Level of Consciousness: awake, alert  and oriented  Airway & Oxygen Therapy: Patient Spontanous Breathing and Patient connected to face mask oxygen  Post-op Assessment: Report given to RN, Post -op Vital signs reviewed and stable and Patient moving all extremities X 4  Post vital signs: Reviewed and stable  Last Vitals:  Vitals:   03/20/17 0717 03/20/17 1246  BP: 133/78 (!) 154/91  Pulse: 78 86  Resp: 16   Temp: 36.8 C (P) 36.6 C    Last Pain:  Vitals:   03/20/17 0717  TempSrc: Oral  PainSc: 5       Patients Stated Pain Goal: 3 (09/11/23 4695)  Complications: No apparent anesthesia complications

## 2017-03-20 NOTE — Op Note (Signed)
Preop Diagnosis: Endometriosis, Pelvic Pain, Menorrhagia   Postop Diagnosis: Endometriosis, Pelvic Pain, Menorrhagia   Procedure: HYSTERECTOMY TOTAL LAPAROSCOPIC WITH BILATERAL SALPINGECTOMY RIGHT OOPHORECTOMY and CYSTOSCOPY   Anesthesia: General   Anesthesiologist: Annye Asa, MD   Attending: Everett Graff, MD   Assistant: Earnstine Regal, PA-C  Findings: Endometriosis in right adnexa, nl bilatetal tubes  Pathology: Uterus, Cervix and Bilateral Fallopian Tubes and Right Ovary.  141.7g  Fluids: 1500 cc  UOP: 300 cc  EBL: 425 cc  Complications: None  Procedure: The patient was taken to the operating room, placed under general anesthesia and prepped and draped in the normal sterile fashion. A Foley catheter was placed in the bladder. The uterus sounded to 10.  A weighted speculum and vaginal retractors were placed in the vagina.  Tenaculum was placed on the anterior lip of the cervix.  A size 8 cm tip was used and the rumi was placed, tip balloon and occluder insufflated. Attention was then turned to the abdomen. A 10 mm umbilical incision was made with the scalpel after 5 cc of 0.25% percent Marcaine was used for local anesthesia. The Veress needle was placed in the intra-abdominal cavity and insufflation obtained.  A 10 mm trochar was advanced into the intra-abdominal cavity and the laparoscope was introduced.  Two 5 mm trochars were placed in the right and left lower quadrants under direct visualization with the laparoscope and a 8 mm trochar was placed in the suprapubic area under direct visualization as well for use with the airseal device.  The left fallopian tube was excised with the harmonic and the same was done on the contralateral side.  The harmonic scalpel was then used to cauterize and cut the uterine ovarian ligament on the left as well as the round ligament.  The same was done on the contralateral side and the infundibulopelvic ligament was cauterized with the  kleppinger and then excised with the harmonic and the right tube and ovary were excised and removed via the 55mm suprapubic port. The uterine artery on the left was cauterized and cut with the harmonic scalpel and the same was done on the contralateral side.  The harmonic was then used to circumscribe the [facility] ring and free the uterus and cervix on the left. The same was done on the contralateral side.  The uterus was then pulled into the vagina.  Both angles were sutured with 1 PDS.  The  remainder of the cuff was sutured with 1 PDS using interrupted stitches until the vaginal cuff was closed.  A total of approximately 5 sutures were used.   Irrigation was performed.  Small oozing at left ovary was cauterized with the kleppinger and surgicel was placed on the left ovary and the left side of vaginal cuff.  The 78mm suprapubic port was removed and the fascia repaired with 0 vicryl.  The two 68mm ports were removed under direct visualization as well.  The 95GL umbilical trocar was removed under direct visualization as well and fascia repaired with 0 vicryl.  The suprapubic and umbilical skin incisions were closed with 3-0 monocryl via subcuticular stitch and all incisions were covered with dermabond.  Attention was then turned to the perineum.  The vagina was inspected and the cuff was noted to be intact.  Cystoscopy was performed and both ureters were seen to efflux without difficulty. Methylene blue was administered.  The bladder had full integrity with no suture or laceration visualized.  Sponge lap and needle counts were correct.  The  patient tolerated the procedure well and was returned to the PACU in stable condition.

## 2017-03-20 NOTE — Interval H&P Note (Signed)
History and Physical Interval Note:  03/20/2017 8:19 AM  Tina Hunt  has presented today for surgery, with the diagnosis of Endometriosis, Pelvic Pain, Menorrhagia  The various methods of treatment have been discussed with the patient and family. After consideration of risks, benefits and other options for treatment, the patient has consented to  Procedure(s): HYSTERECTOMY TOTAL LAPAROSCOPIC WITH SALPINGECTOMY (Bilateral)  RIGHT OOPHORECTOMY POSSIBLE LAPAROSCOPIC ASSISTED VAGINAL HYSTERECTOMY POSSIBLE HYSTERECTOMY ABDOMINAL WITH SALPINGECTOMY (Bilateral) POSSIBLE CYSTOSCOPY (Bilateral) as a surgical intervention .  The patient's history has been reviewed, patient examined, no change in status, stable for surgery.  I have reviewed the patient's chart and labs.  Questions were answered to the patient's satisfaction.     Delice Lesch

## 2017-03-20 NOTE — Anesthesia Procedure Notes (Signed)
Procedure Name: Intubation Date/Time: 03/20/2017 8:47 AM Performed by: Annye Asa Pre-anesthesia Checklist: Patient identified, Patient being monitored, Timeout performed, Emergency Drugs available and Suction available Patient Re-evaluated:Patient Re-evaluated prior to inductionOxygen Delivery Method: Circle System Utilized Preoxygenation: Pre-oxygenation with 100% oxygen Intubation Type: IV induction Ventilation: Mask ventilation without difficulty Laryngoscope Size: Miller and 2 Grade View: Grade II Tube type: Oral Tube size: 7.0 mm Number of attempts: 1 Airway Equipment and Method: stylet Placement Confirmation: ETT inserted through vocal cords under direct vision,  positive ETCO2 and breath sounds checked- equal and bilateral Secured at: 22 cm Tube secured with: Tape Dental Injury: Teeth and Oropharynx as per pre-operative assessment

## 2017-03-20 NOTE — Anesthesia Preprocedure Evaluation (Addendum)
Anesthesia Evaluation  Patient identified by MRN, date of birth, ID band Patient awake    Reviewed: Allergy & Precautions, NPO status , Patient's Chart, lab work & pertinent test results  History of Anesthesia Complications (+) PONV  Airway Mallampati: II  TM Distance: >3 FB Neck ROM: Full    Dental  (+) Dental Advisory Given   Pulmonary sleep apnea (does not use CPAP) ,    breath sounds clear to auscultation       Cardiovascular hypertension, Pt. on medications and Pt. on home beta blockers (-) angina Rhythm:Regular Rate:Normal     Neuro/Psych  Headaches, Anxiety Depression    GI/Hepatic Neg liver ROS, GERD  Medicated and Controlled,  Endo/Other  Morbid obesity  Renal/GU negative Renal ROS     Musculoskeletal  (+) Arthritis ,   Abdominal (+) + obese,   Peds  Hematology   Anesthesia Other Findings   Reproductive/Obstetrics                            Anesthesia Physical Anesthesia Plan  ASA: III  Anesthesia Plan: General   Post-op Pain Management:    Induction: Intravenous  PONV Risk Score and Plan: 4 or greater and Ondansetron, Dexamethasone, Midazolam and Scopolamine patch - Pre-op  Airway Management Planned: Oral ETT  Additional Equipment:   Intra-op Plan:   Post-operative Plan: Extubation in OR  Informed Consent: I have reviewed the patients History and Physical, chart, labs and discussed the procedure including the risks, benefits and alternatives for the proposed anesthesia with the patient or authorized representative who has indicated his/her understanding and acceptance.   Dental advisory given  Plan Discussed with: CRNA and Surgeon  Anesthesia Plan Comments: (Plan routine monitors, GETA)        Anesthesia Quick Evaluation

## 2017-03-21 ENCOUNTER — Encounter (HOSPITAL_COMMUNITY): Payer: Self-pay | Admitting: Obstetrics and Gynecology

## 2017-03-21 DIAGNOSIS — N920 Excessive and frequent menstruation with regular cycle: Secondary | ICD-10-CM | POA: Diagnosis not present

## 2017-03-21 LAB — CBC
HCT: 38.4 % (ref 36.0–46.0)
Hemoglobin: 12.6 g/dL (ref 12.0–15.0)
MCH: 31.3 pg (ref 26.0–34.0)
MCHC: 32.8 g/dL (ref 30.0–36.0)
MCV: 95.5 fL (ref 78.0–100.0)
Platelets: 258 10*3/uL (ref 150–400)
RBC: 4.02 MIL/uL (ref 3.87–5.11)
RDW: 13.5 % (ref 11.5–15.5)
WBC: 15.5 10*3/uL — ABNORMAL HIGH (ref 4.0–10.5)

## 2017-03-21 MED ORDER — CHLORHEXIDINE GLUCONATE CLOTH 2 % EX PADS
6.0000 | MEDICATED_PAD | Freq: Every day | CUTANEOUS | Status: DC
  Administered 2017-03-21: 6 via TOPICAL

## 2017-03-21 MED ORDER — MUPIROCIN 2 % EX OINT
1.0000 "application " | TOPICAL_OINTMENT | Freq: Two times a day (BID) | CUTANEOUS | Status: DC
  Administered 2017-03-21: 1 via NASAL
  Filled 2017-03-21: qty 22

## 2017-03-21 MED ORDER — OXYCODONE-ACETAMINOPHEN 5-325 MG PO TABS: ORAL_TABLET | ORAL | 0 refills | Status: DC

## 2017-03-21 MED ORDER — IBUPROFEN 600 MG PO TABS: ORAL_TABLET | ORAL | 1 refills | Status: DC

## 2017-03-21 NOTE — Discharge Instructions (Signed)
Call [facility] @ [phone] if:  You have a temperature greater than or equal to 100.4 degrees Farenheit orally You have pain that is not made better by the pain medication given and taken as directed You have excessive bleeding or problems urinating  Take Colace (Docusate Sodium/Stool Softener) 100 mg 2-3 times daily while taking narcotic pain medicine to avoid constipation or until bowel movements are regular. Take Ibuprofen 600 mg with food every 6 hours for 5 days then as needed for pain  You may drive after 2  weeks You may walk up steps  You may shower  You may resume a regular diet  Keep incisions clean and dry; remove honeycomb dressing on March 27, 2017 Do not lift over 15 pounds for 6 weeks Avoid anything in vagina for 6 weeks (or until after your post-operative visit)

## 2017-03-21 NOTE — Discharge Summary (Signed)
Physician Discharge Summary  Patient ID: RENLEE FLOOR MRN: [id number] DOB/AGE: [birth year] 42 y.o.  Admit date: 03/20/2017 Discharge date: 03/21/2017   Discharge Diagnoses: Chronic Pelvic Pain, Endometriosis and Adenomyosis Active Problems:   Pelvic pain in female   Operation: Total Laparoscopic Hysterectomy, Right Salpingo-oophorectomy, Left Salpingectomy and Cystoscopy   Discharged Condition: Good  Hospital Course: On the date of admission,  the patient underwent the aforementioned procedures and tolerated them well.  Post operative course was unremarkable  with the patient resuming bowel and bladder function by post operative day #1 and was therefore deemed ready for discharge home.  Discharge hemoglobin was 12.6.  Disposition: 01-Home or Self Care  Discharge Medications:  Allergies as of 03/21/2017      Reactions   Biaxin [clarithromycin] Diarrhea, Nausea And Vomiting   Penicillins Hives   Has patient had a PCN reaction causing immediate rash, facial/tongue/throat swelling, SOB or lightheadedness with hypotension: No Has patient had a PCN reaction causing severe rash involving mucus membranes or skin necrosis: No Has patient had a PCN reaction that required hospitalization No Has patient had a PCN reaction occurring within the last 10 years: No If all of the above answers are "NO", then may proceed with Cephalosporin use.      Medication List    STOP taking these medications   norethindrone 5 MG tablet Commonly known as:  AYGESTIN   OVER THE COUNTER MEDICATION     TAKE these medications   ALPRAZolam 0.5 MG tablet Commonly known as:  XANAX Take 0.5 mg by mouth 2 (two) times daily as needed for anxiety.   cyclobenzaprine 10 MG tablet Commonly known as:  FLEXERIL Take 10 mg by mouth at bedtime as needed for muscle spasms.   diphenhydrAMINE 25 mg capsule Commonly known as:  BENADRYL Take 25 mg by mouth every 6 (six) hours as needed for allergies.   DULoxetine  30 MG capsule Commonly known as:  CYMBALTA Take 90 mg by mouth at bedtime.   ibuprofen 600 MG tablet Commonly known as:  ADVIL,MOTRIN 1 po  pc   every 6 hours for 5 days then as needed for pain What changed:  medication strength  how much to take  how to take this  when to take this  reasons to take this  additional instructions   MAGNESIUM OXIDE PO Take 1 tablet by mouth daily.   methocarbamol 500 MG tablet Commonly known as:  ROBAXIN Take 1 tablet (500 mg total) by mouth 4 (four) times daily. What changed:  when to take this  reasons to take this   oxyCODONE-acetaminophen 5-325 MG tablet Commonly known as:  ROXICET 1-2  tablets po every 6  hours prn-moderate pain What changed:  how much to take  how to take this  when to take this  reasons to take this  additional instructions   promethazine 25 MG tablet Commonly known as:  PHENERGAN Take 1 tablet (25 mg total) by mouth every 6 (six) hours as needed for nausea or vomiting. What changed:  reasons to take this   propranolol 60 MG tablet Commonly known as:  INDERAL Take 1 tablet (60 mg total) by mouth 2 (two) times daily.   rizatriptan 10 MG disintegrating tablet Commonly known as:  MAXALT-MLT Take 1 tablet (10 mg total) by mouth as needed. May repeat in 2 hours if needed What changed:  reasons to take this  additional instructions   SUMAtriptan 6 MG/0.5ML Soaj Commonly known as:  [place]  Inject 6 mg into the skin at bedtime as needed. What changed:  when to take this   topiramate 100 MG tablet Commonly known as:  TOPAMAX Take 1 tablet (100 mg total) by mouth 2 (two) times daily.   traZODone 100 MG tablet Commonly known as:  DESYREL Take 100-200 mg by mouth at bedtime.       Follow-up: Dr. Harvie Bridge. Mancel Bale on April 27, 2017 at 3 p.m.   SignedEarnstine Regal, PA_C 03/21/2017, 7:49 AM

## 2017-03-21 NOTE — Progress Notes (Signed)
Discharge teaching complete. Pt understood all information and did not have any questions. Pt discharged home to family. 

## 2017-03-21 NOTE — Progress Notes (Signed)
Tina Hunt is a46 y.o.  [id number]  Post Op Date # 1:  TLH/RSO/LS/Cystoscopy  Subjective: Patient is Doing well postoperatively. Patient has Pain is controlled with current analgesics. Medications being used: prescription NSAID's including Ketorolac 30 mg IV and narcotic analgesics including Hydromorphone PCA. Ambulating in the halls without dizziness, tolerating a regular diet without nausea and has voided a small amount (patient removed measuring hat) since Foley was removed.    Objective: Vital signs in last 24 hours: Temp:  [97.7 F (36.5 C)-98.5 F (36.9 C)] 97.7 F (36.5 C) (06/26 0400) Pulse Rate:  [80-102] 91 (06/26 0400) Resp:  [12-20] 15 (06/26 0626) BP: (108-157)/(53-95) 116/56 (06/26 0400) SpO2:  [95 %-100 %] 99 % (06/26 0626)  Intake/Output from previous day: 06/25 0701 - 06/26 0700 In: 3150 [P.O.:1200; I.V.:1950] Out: 1225 [Urine:1125] Intake/Output this shift: No intake/output data recorded.  Recent Labs Lab 03/21/17 0558  WBC 15.5*  HGB 12.6  HCT 38.4  PLT 258    No results for input(s): NA, K, CL, CO2, BUN, CREATININE, CALCIUM, PROT, BILITOT, ALKPHOS, ALT, AST, GLUCOSE in the last 168 hours.  Invalid input(s): LABALBU  EXAM: General: alert, cooperative and no distress Resp: clear to auscultation bilaterally Cardio: regular rate and rhythm, S1, S2 normal, no murmur, click, rub or gallop GI: Bowel sound present, soft; umbilical & suprapubic dressings  are intact and dry with a small dry blood stain on umbiilical dressing. Extremities: Homans sign is negative, no sign of DVT and no calf tenderdess. Vaginal Bleeding: none   Assessment: s/p Procedure(s): HYSTERECTOMY TOTAL LAPAROSCOPIC WITH BILATERAL SALPINGECTOMY CYSTOSCOPY LAPAROSCOPIC RIGHT OOPHORECTOMY: stable, progressing well and tolerating diet  Plan: Advance to PO medication Routine care with plans to discharge later today.  LOS: 0 days    Esperansa Sarabia, PA-C 03/21/2017 7:27 AM

## 2017-03-21 NOTE — Progress Notes (Signed)
Wasted 10 cc's Dilaudid PCS in sink. Angelina Pih, RN witness

## 2017-03-25 ENCOUNTER — Inpatient Hospital Stay (HOSPITAL_COMMUNITY)
Admission: AD | Admit: 2017-03-25 | Discharge: 2017-03-26 | Disposition: A | Discharge status: Home / Self Care | Payer: BC Managed Care – PPO | Source: Ambulatory Visit | Attending: Obstetrics and Gynecology | Admitting: Obstetrics and Gynecology

## 2017-03-25 DIAGNOSIS — M1711 Unilateral primary osteoarthritis, right knee: Secondary | ICD-10-CM | POA: Insufficient documentation

## 2017-03-25 DIAGNOSIS — Z9889 Other specified postprocedural states: Secondary | ICD-10-CM | POA: Insufficient documentation

## 2017-03-25 DIAGNOSIS — Z881 Allergy status to other antibiotic agents status: Secondary | ICD-10-CM | POA: Insufficient documentation

## 2017-03-25 DIAGNOSIS — G709 Myoneural disorder, unspecified: Secondary | ICD-10-CM | POA: Insufficient documentation

## 2017-03-25 DIAGNOSIS — Z8249 Family history of ischemic heart disease and other diseases of the circulatory system: Secondary | ICD-10-CM | POA: Insufficient documentation

## 2017-03-25 DIAGNOSIS — H6980 Other specified disorders of Eustachian tube, unspecified ear: Secondary | ICD-10-CM | POA: Insufficient documentation

## 2017-03-25 DIAGNOSIS — R102 Pelvic and perineal pain: Secondary | ICD-10-CM | POA: Insufficient documentation

## 2017-03-25 DIAGNOSIS — Z880 Allergy status to penicillin: Secondary | ICD-10-CM | POA: Insufficient documentation

## 2017-03-25 DIAGNOSIS — Z90710 Acquired absence of both cervix and uterus: Secondary | ICD-10-CM | POA: Insufficient documentation

## 2017-03-25 DIAGNOSIS — G8918 Other acute postprocedural pain: Secondary | ICD-10-CM

## 2017-03-25 DIAGNOSIS — Z801 Family history of malignant neoplasm of trachea, bronchus and lung: Secondary | ICD-10-CM | POA: Insufficient documentation

## 2017-03-25 DIAGNOSIS — Z90722 Acquired absence of ovaries, bilateral: Secondary | ICD-10-CM | POA: Insufficient documentation

## 2017-03-25 DIAGNOSIS — Z833 Family history of diabetes mellitus: Secondary | ICD-10-CM | POA: Insufficient documentation

## 2017-03-25 DIAGNOSIS — G4730 Sleep apnea, unspecified: Secondary | ICD-10-CM | POA: Insufficient documentation

## 2017-03-25 DIAGNOSIS — Z90721 Acquired absence of ovaries, unilateral: Secondary | ICD-10-CM | POA: Insufficient documentation

## 2017-03-25 DIAGNOSIS — Z823 Family history of stroke: Secondary | ICD-10-CM | POA: Insufficient documentation

## 2017-03-25 DIAGNOSIS — G43909 Migraine, unspecified, not intractable, without status migrainosus: Secondary | ICD-10-CM | POA: Insufficient documentation

## 2017-03-26 ENCOUNTER — Encounter (HOSPITAL_COMMUNITY): Payer: Self-pay

## 2017-03-26 DIAGNOSIS — R109 Unspecified abdominal pain: Secondary | ICD-10-CM | POA: Diagnosis present

## 2017-03-26 DIAGNOSIS — G8918 Other acute postprocedural pain: Secondary | ICD-10-CM

## 2017-03-26 DIAGNOSIS — M1711 Unilateral primary osteoarthritis, right knee: Secondary | ICD-10-CM | POA: Diagnosis not present

## 2017-03-26 DIAGNOSIS — G709 Myoneural disorder, unspecified: Secondary | ICD-10-CM | POA: Diagnosis not present

## 2017-03-26 DIAGNOSIS — Z823 Family history of stroke: Secondary | ICD-10-CM | POA: Diagnosis not present

## 2017-03-26 DIAGNOSIS — Z8249 Family history of ischemic heart disease and other diseases of the circulatory system: Secondary | ICD-10-CM | POA: Diagnosis not present

## 2017-03-26 DIAGNOSIS — Z801 Family history of malignant neoplasm of trachea, bronchus and lung: Secondary | ICD-10-CM | POA: Diagnosis not present

## 2017-03-26 DIAGNOSIS — Z881 Allergy status to other antibiotic agents status: Secondary | ICD-10-CM | POA: Diagnosis not present

## 2017-03-26 DIAGNOSIS — H6980 Other specified disorders of Eustachian tube, unspecified ear: Secondary | ICD-10-CM | POA: Diagnosis not present

## 2017-03-26 DIAGNOSIS — Z9889 Other specified postprocedural states: Secondary | ICD-10-CM | POA: Diagnosis not present

## 2017-03-26 DIAGNOSIS — Z833 Family history of diabetes mellitus: Secondary | ICD-10-CM | POA: Diagnosis not present

## 2017-03-26 DIAGNOSIS — R102 Pelvic and perineal pain: Secondary | ICD-10-CM | POA: Diagnosis not present

## 2017-03-26 DIAGNOSIS — G4730 Sleep apnea, unspecified: Secondary | ICD-10-CM | POA: Diagnosis not present

## 2017-03-26 DIAGNOSIS — Z90721 Acquired absence of ovaries, unilateral: Secondary | ICD-10-CM | POA: Diagnosis not present

## 2017-03-26 DIAGNOSIS — G43909 Migraine, unspecified, not intractable, without status migrainosus: Secondary | ICD-10-CM | POA: Diagnosis not present

## 2017-03-26 DIAGNOSIS — Z880 Allergy status to penicillin: Secondary | ICD-10-CM | POA: Diagnosis not present

## 2017-03-26 DIAGNOSIS — Z90710 Acquired absence of both cervix and uterus: Secondary | ICD-10-CM | POA: Diagnosis not present

## 2017-03-26 DIAGNOSIS — Z90722 Acquired absence of ovaries, bilateral: Secondary | ICD-10-CM | POA: Diagnosis not present

## 2017-03-26 LAB — URINALYSIS, ROUTINE W REFLEX MICROSCOPIC
Bilirubin Urine: NEGATIVE
Glucose, UA: NEGATIVE mg/dL
Hgb urine dipstick: NEGATIVE
Ketones, ur: NEGATIVE mg/dL
Leukocytes, UA: NEGATIVE
Nitrite: NEGATIVE
Protein, ur: NEGATIVE mg/dL
Specific Gravity, Urine: 1.015 (ref 1.005–1.030)
pH: 6 (ref 5.0–8.0)

## 2017-03-26 LAB — CBC
HCT: 39.1 % (ref 36.0–46.0)
Hemoglobin: 13.1 g/dL (ref 12.0–15.0)
MCH: 31.5 pg (ref 26.0–34.0)
MCHC: 33.5 g/dL (ref 30.0–36.0)
MCV: 94 fL (ref 78.0–100.0)
Platelets: 259 10*3/uL (ref 150–400)
RBC: 4.16 MIL/uL (ref 3.87–5.11)
RDW: 13.3 % (ref 11.5–15.5)
WBC: 10.7 10*3/uL — ABNORMAL HIGH (ref 4.0–10.5)

## 2017-03-26 LAB — WET PREP, GENITAL
Clue Cells Wet Prep HPF POC: NONE SEEN
Sperm: NONE SEEN
Trich, Wet Prep: NONE SEEN
Yeast Wet Prep HPF POC: NONE SEEN

## 2017-03-26 LAB — BASIC METABOLIC PANEL
Anion gap: 7 (ref 5–15)
BUN: 16 mg/dL (ref 6–20)
CO2: 23 mmol/L (ref 22–32)
Calcium: 9.4 mg/dL (ref 8.9–10.3)
Chloride: 108 mmol/L (ref 101–111)
Creatinine, Ser: 0.86 mg/dL (ref 0.44–1.00)
GFR calc Af Amer: >60 mL/min (ref >60)
GFR calc non Af Amer: >60 mL/min (ref >60)
Glucose, Bld: 103 mg/dL — ABNORMAL HIGH (ref 65–99)
Potassium: 4.2 mmol/L (ref 3.5–5.1)
Sodium: 138 mmol/L (ref 135–145)

## 2017-03-26 LAB — DIFFERENTIAL
Basophils Absolute: 0.1 10*3/uL (ref 0.0–0.1)
Basophils Relative: 1 %
Eosinophils Absolute: 0.3 10*3/uL (ref 0.0–0.7)
Eosinophils Relative: 3 %
Lymphocytes Relative: 30 %
Lymphs Abs: 3 10*3/uL (ref 0.7–4.0)
Monocytes Absolute: 0.5 10*3/uL (ref 0.1–1.0)
Monocytes Relative: 5 %
Neutro Abs: 6.2 10*3/uL (ref 1.7–7.7)
Neutrophils Relative %: 61 %

## 2017-03-26 MED ORDER — PROMETHAZINE HCL 25 MG PO TABS
25.0000 mg | ORAL_TABLET | Freq: Once | ORAL | Status: AC
  Administered 2017-03-26: 25 mg via ORAL
  Filled 2017-03-26: qty 1

## 2017-03-26 MED ORDER — CEPHALEXIN 500 MG PO CAPS: 500.0000 mg | ORAL_CAPSULE | Freq: Two times a day (BID) | ORAL | 0 refills | Status: AC

## 2017-03-26 MED ORDER — KETOROLAC TROMETHAMINE 60 MG/2ML IM SOLN
60.0000 mg | Freq: Once | INTRAMUSCULAR | Status: AC
  Administered 2017-03-26: 60 mg via INTRAMUSCULAR
  Filled 2017-03-26: qty 2

## 2017-03-26 NOTE — MAU Note (Addendum)
Had lap assist vag hyst on Monday. Had some gas pain yesterday. Had good BM yesterday and felt better afterward. Today having more pain in abd and percocet is not helping like before. Some pain on L side and has L ovary. Some burning pain R side of abdomen. Neck hurts and feel "weird in my head". Some nausea and dizziness. No vag bleeding. Temp not over 100 at home

## 2017-03-26 NOTE — MAU Provider Note (Signed)
History  Tina Hunt is a 41 y.o. female, P 1-0-0-1 who presents to MAU after calling w/ c/o  increasing abdominal pain - Percocet not helping like before. Stabbing pain on L side (has L ovary), and burning on R side of abdomen. Neck hurts and feels "weird in my head." Some nausea and dizziness upon standing. Denies chills, fever, constipation, vomiting, diarrhea, dysuria, frequency, hematuria, vag bleeding or discharge. Tmax 99.0 at home.  HYSTERECTOMY TOTAL LAPAROSCOPIC WITH BILATERAL SALPINGECTOMY RIGHT OOPHORECTOMY and CYSTOSCOPY performed on 03/20/17 due to Endometriosis, Pelvic Pain and Menorrhagia. Reports tolerating PO fluids, but does not have much of an appetite. + BM yesterday.    BMI 64.4   Patient Active Problem List   Diagnosis Date Noted  . Acute post-operative pain 03/26/2017  . Chronic migraine 12/22/2016  . Migraine 12/22/2016  . Acute medial meniscal tear 03/11/2014  . Eustachian tube dysfunction 11/28/2011    Chief Complaint  Patient presents with  . Post-op Problem   HPI  As above  OB History    No data available      Past Medical History:  Diagnosis Date  . Anxiety   . Arthritis    "a little in Right knee"  . Depression   . Endometriosis   . Headache(784.0)    MIGRAINES   . History of blood transfusion 01/10/2003   [place] - 3 units transfused  . Migraine   . Neck pain    clench teeth at night  . Neuromuscular disorder ([place])    BLACKOUT SPELLS  BUT NONE SINCE 1996   . Pneumonia    hx of 3 years ago  . PONV (postoperative nausea and vomiting)   . Sleep apnea    Does not use CPAP  . 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 ADENOIDECTOMY  1995   with tube placement  . CESAREAN SECTION     2004   . CHROMOPERTUBATION Bilateral 10/09/2015   Procedure: CHROMOPERTUBATION;  Surgeon: Everett Graff, MD;  Location: [facility];  Service: Gynecology;  Laterality: Bilateral;  . CYSTOSCOPY Bilateral  03/20/2017   Procedure: CYSTOSCOPY;  Surgeon: Everett Graff, MD;  Location: [facility];  Service: Gynecology;  Laterality: Bilateral;  . DILITATION & CURRETTAGE/HYSTROSCOPY WITH NOVASURE ABLATION N/A 10/09/2015   Procedure: DILATATION & CURETTAGE/HYSTEROSCOPY;  Surgeon: Everett Graff, MD;  Location: [facility];  Service: Gynecology;  Laterality: N/A;  . KNEE ARTHROSCOPY Left    1988   . KNEE ARTHROSCOPY Left 03/12/2014   Procedure: LEFT ARTHROSCOPY KNEE WITH DEBRIDEMENT;  Surgeon: Gearlean Alf, MD;  Location: [facility];  Service: Orthopedics;  Laterality: Left;  medical and lateral repair menius  . LAPAROSCOPIC OVARIAN CYSTECTOMY Right 10/09/2015   Procedure: LAPAROSCOPIC RIGHT OVARIAN CYSTECTOMIES, LEFT PARATUBAL CYSTECTOMY ;  Surgeon: Everett Graff, MD;  Location: [facility];  Service: Gynecology;  Laterality: Right;  . TONSILLECTOMY  1983, 1985   ADENOID X2 (SEPARATE SURGERY)  TUBE PLACEMENT  . TOTAL LAPAROSCOPIC HYSTERECTOMY WITH SALPINGECTOMY Bilateral 03/20/2017   Procedure: HYSTERECTOMY TOTAL LAPAROSCOPIC WITH BILATERAL SALPINGECTOMY;  Surgeon: Everett Graff, MD;  Location: [facility];  Service: Gynecology;  Laterality: Bilateral;  . TYMPANOSTOMY [place], 2002, 2006, 2009, 2013    Family History  Problem Relation Age of Onset  . Diabetes Father   . Heart attack Father   . CAD Father   . Stroke Mother   . Diabetes Paternal Grandfather   .  Heart attack Paternal Grandfather   . Hypertension Paternal Grandfather   . Lung cancer Maternal Grandfather     Social History  Substance Use Topics  . Smoking status: Never Smoker  . Smokeless tobacco: Never Used  . Alcohol use Yes     Comment: OCC    Allergies:  Allergies  Allergen Reactions  . Biaxin [Clarithromycin] Diarrhea and Nausea And Vomiting  . Penicillins Hives    Has patient had a PCN reaction causing immediate rash, facial/tongue/throat swelling, SOB or lightheadedness with hypotension: No Has patient had a  PCN reaction causing severe rash involving mucus membranes or skin necrosis: No Has patient had a PCN reaction that required hospitalization No Has patient had a PCN reaction occurring within the last 10 years: No If all of the above answers are "NO", then may proceed with Cephalosporin use.     No prescriptions prior to admission.    ROS  Abdominal pain Physical Exam   Blood pressure 124/70, pulse 77, temperature 99.3 F (37.4 C), resp. rate 18, height 5\' 1"  (1.549 m), weight (!) 156 kg (344 lb), SpO2 99 %.  Tmax 99.9  Physical Exam  Gen: NAD. Neuro: Grossly intact w/o focal deficits. Lungs: CTAB. CV: RRR w/o M/R/G. Back: Neg CVAT bilaterally. Abdomen: soft, mild tenderness to right and left lower quadrants, no rebound tenderness or guarding. NEFG. Wet prep obtained by Dr. Nelda Marseille. Umbilical, suprapubic incisions and ports intact, foul odor noted. Honeycomb dressing partially applied at suprapubic incision - removed and area cleaned - edematous bruise noted above this site.  Results for orders placed or performed during the hospital encounter of 03/25/17 (from the past 24 hour(s))  Urinalysis, Routine w reflex microscopic     Status: None   Collection Time: 03/26/17 12:14 AM  Result Value Ref Range   Color, Urine YELLOW YELLOW   APPearance CLEAR CLEAR   Specific Gravity, Urine 1.015 1.005 - 1.030   pH 6.0 5.0 - 8.0   Glucose, UA NEGATIVE NEGATIVE mg/dL   Hgb urine dipstick NEGATIVE NEGATIVE   Bilirubin Urine NEGATIVE NEGATIVE   Ketones, ur NEGATIVE NEGATIVE mg/dL   Protein, ur NEGATIVE NEGATIVE mg/dL   Nitrite NEGATIVE NEGATIVE   Leukocytes, UA NEGATIVE NEGATIVE  CBC     Status: Abnormal   Collection Time: 03/26/17  1:35 AM  Result Value Ref Range   WBC 10.7 (H) 4.0 - 10.5 K/uL   RBC 4.16 3.87 - 5.11 MIL/uL   Hemoglobin 13.1 12.0 - 15.0 g/dL   HCT 39.1 36.0 - 46.0 %   MCV 94.0 78.0 - 100.0 fL   MCH 31.5 26.0 - 34.0 pg   MCHC 33.5 30.0 - 36.0 g/dL   RDW 13.3 11.5  - 15.5 %   Platelets 259 150 - 400 K/uL  Basic metabolic panel     Status: Abnormal   Collection Time: 03/26/17  1:35 AM  Result Value Ref Range   Sodium 138 135 - 145 mmol/L   Potassium 4.2 3.5 - 5.1 mmol/L   Chloride 108 101 - 111 mmol/L   CO2 23 22 - 32 mmol/L   Glucose, Bld 103 (H) 65 - 99 mg/dL   BUN 16 6 - 20 mg/dL   Creatinine, Ser 0.86 0.44 - 1.00 mg/dL   Calcium 9.4 8.9 - 10.3 mg/dL   GFR calc non Af Amer >60 >60 mL/min   GFR calc Af Amer >60 >60 mL/min   Anion gap 7 5 - 15  Differential  Status: None   Collection Time: 03/26/17  1:35 AM  Result Value Ref Range   Neutrophils Relative % 61 %   Neutro Abs 6.2 1.7 - 7.7 K/uL   Lymphocytes Relative 30 %   Lymphs Abs 3.0 0.7 - 4.0 K/uL   Monocytes Relative 5 %   Monocytes Absolute 0.5 0.1 - 1.0 K/uL   Eosinophils Relative 3 %   Eosinophils Absolute 0.3 0.0 - 0.7 K/uL   Basophils Relative 1 %   Basophils Absolute 0.1 0.0 - 0.1 K/uL  Wet prep, genital     Status: Abnormal   Collection Time: 03/26/17  3:00 AM  Result Value Ref Range   Yeast Wet Prep HPF POC NONE SEEN NONE SEEN   Trich, Wet Prep NONE SEEN NONE SEEN   Clue Cells Wet Prep HPF POC NONE SEEN NONE SEEN   WBC, Wet Prep HPF POC FEW (A) NONE SEEN   Sperm NONE SEEN       ED Course  UA Toradol Phenergan CBC w/ diff BMP Wet prep  Assessment: Post op pain No concerns for infection at this time  Plan: Toradol 60 mg IM and Phenergan 25 mg po given w/ relief. Dr. Nelda Marseille consulted and evaluated pt at bedside. She advised office f/u w/ surgeon in 2 days. Will reach out to Dr. Mancel Bale' CMA to schedule. Steristrips applied to ports by Dr. Nelda Marseille as well as cleaning of sites. Proper cleaning and allowing air of areas discussed. Pt to be started on 7-day course of Keflex, 500 mg bid - cleared by Pharmacy due to PCN allergy. Normal physiologic responses to healing discussed. Informed pt that her assessment was not suggestive of an infection from the surgical procedure.  Advised to strictly adhere to surgeon's post-op instructions. D/C'd home in the care of sister in stable condition.  Farrel Gordon CNM, MS 03/26/2017 4:56 AM

## 2017-03-26 NOTE — Progress Notes (Signed)
Tina Hunt CNM in earlier to discuss test results and d/c plan. Written and verbal d/c instructions given and understanding voiced.

## 2017-09-05 ENCOUNTER — Telehealth: Payer: BC Managed Care – PPO | Admitting: Family

## 2017-09-05 DIAGNOSIS — J019 Acute sinusitis, unspecified: Secondary | ICD-10-CM

## 2017-09-05 DIAGNOSIS — B9689 Other specified bacterial agents as the cause of diseases classified elsewhere: Secondary | ICD-10-CM

## 2017-09-05 MED ORDER — DOXYCYCLINE HYCLATE 100 MG PO TABS: 100.0000 mg | ORAL_TABLET | Freq: Two times a day (BID) | ORAL | 0 refills | Status: DC

## 2017-09-05 NOTE — Progress Notes (Signed)

## 2017-09-18 ENCOUNTER — Other Ambulatory Visit: Payer: Self-pay | Admitting: Neurology

## 2017-12-03 IMAGING — CR DG CHEST 2V
2 series · 2 of 2 positions shown · non-contrast
Comparison: Chest radiograph 07/25/2014

CLINICAL DATA: Cough and shortness of breath. Diagnosed with
pneumonia weeks prior, no improvement in symptoms after treatment.

EXAM:
CHEST  2 VIEW

[w chest pa]
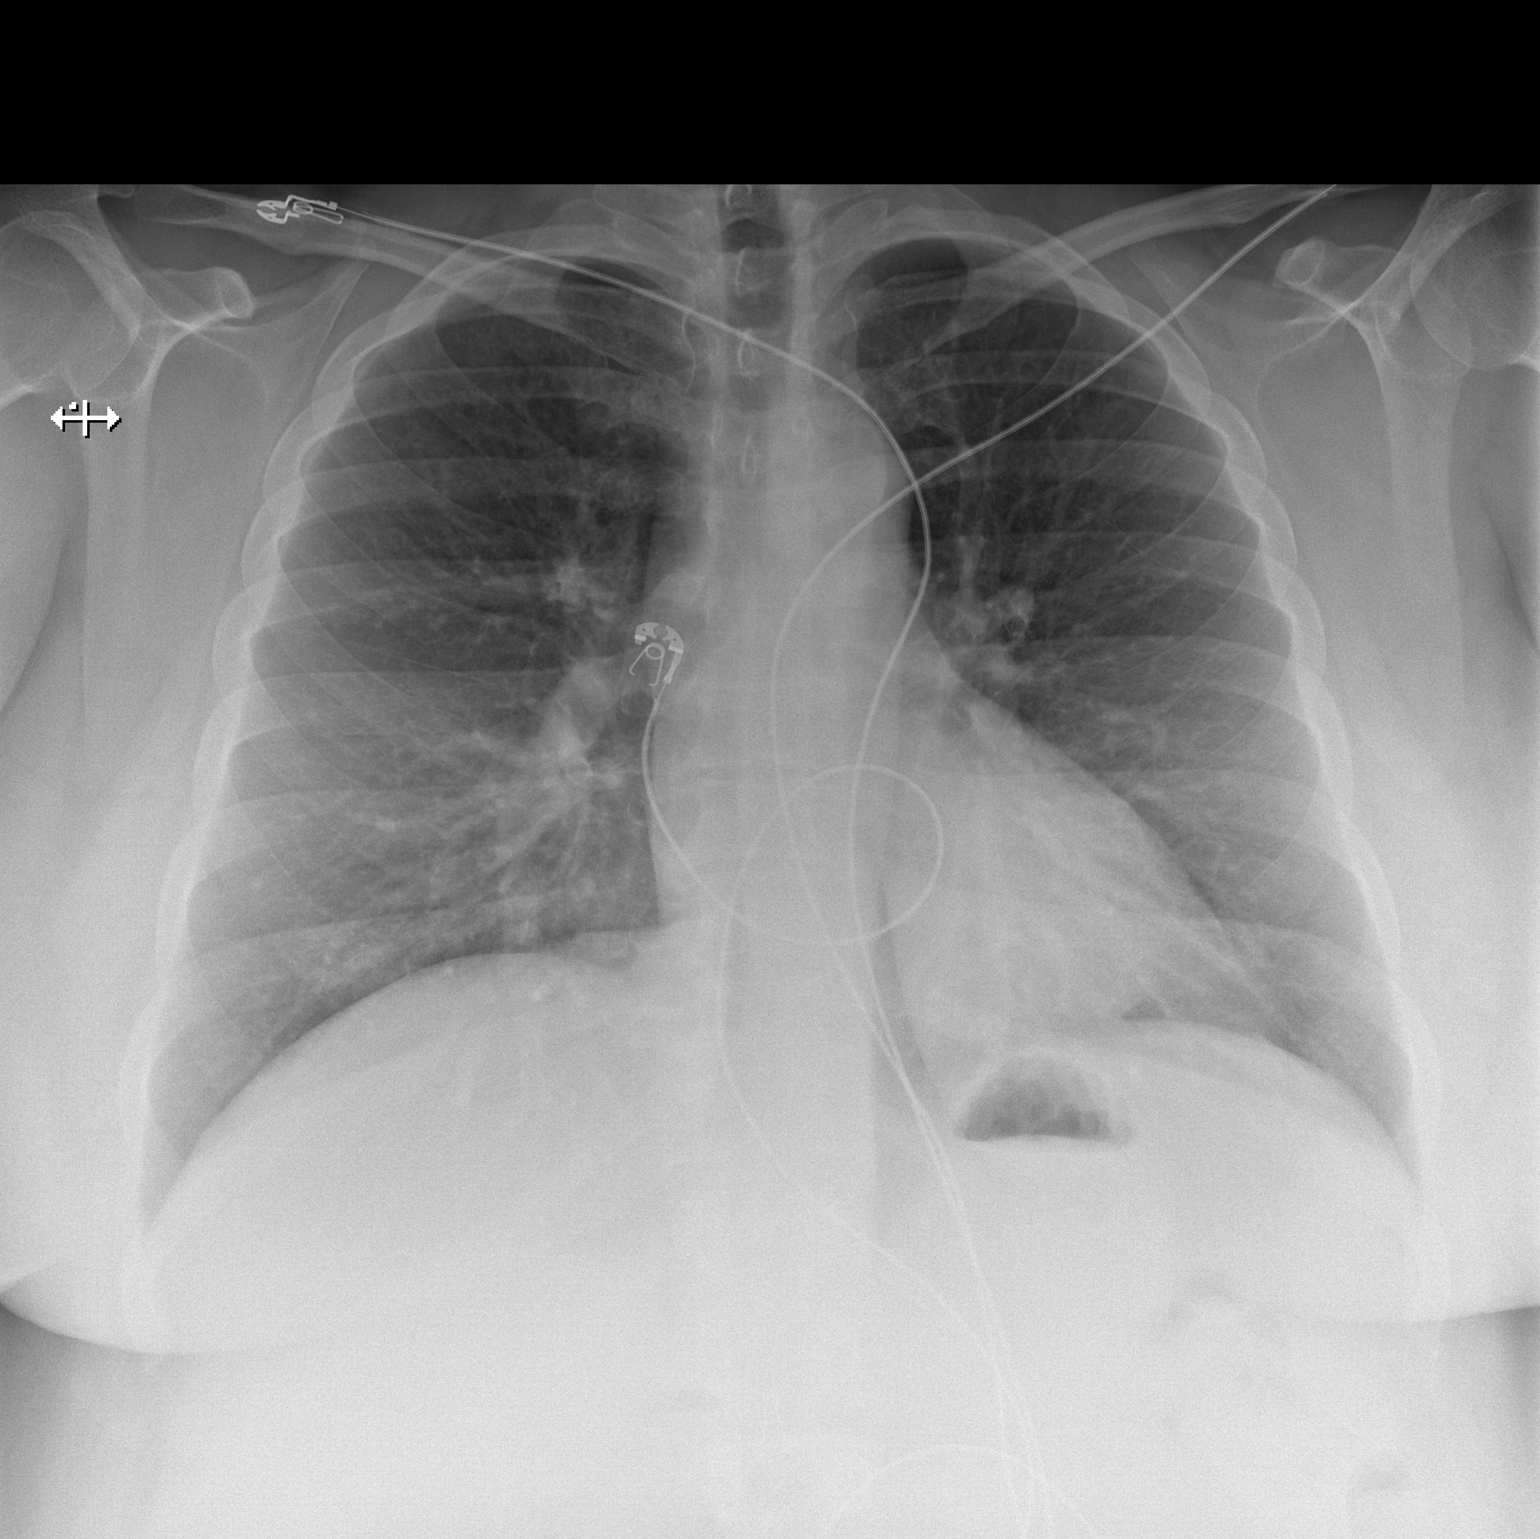

[w chest lat]
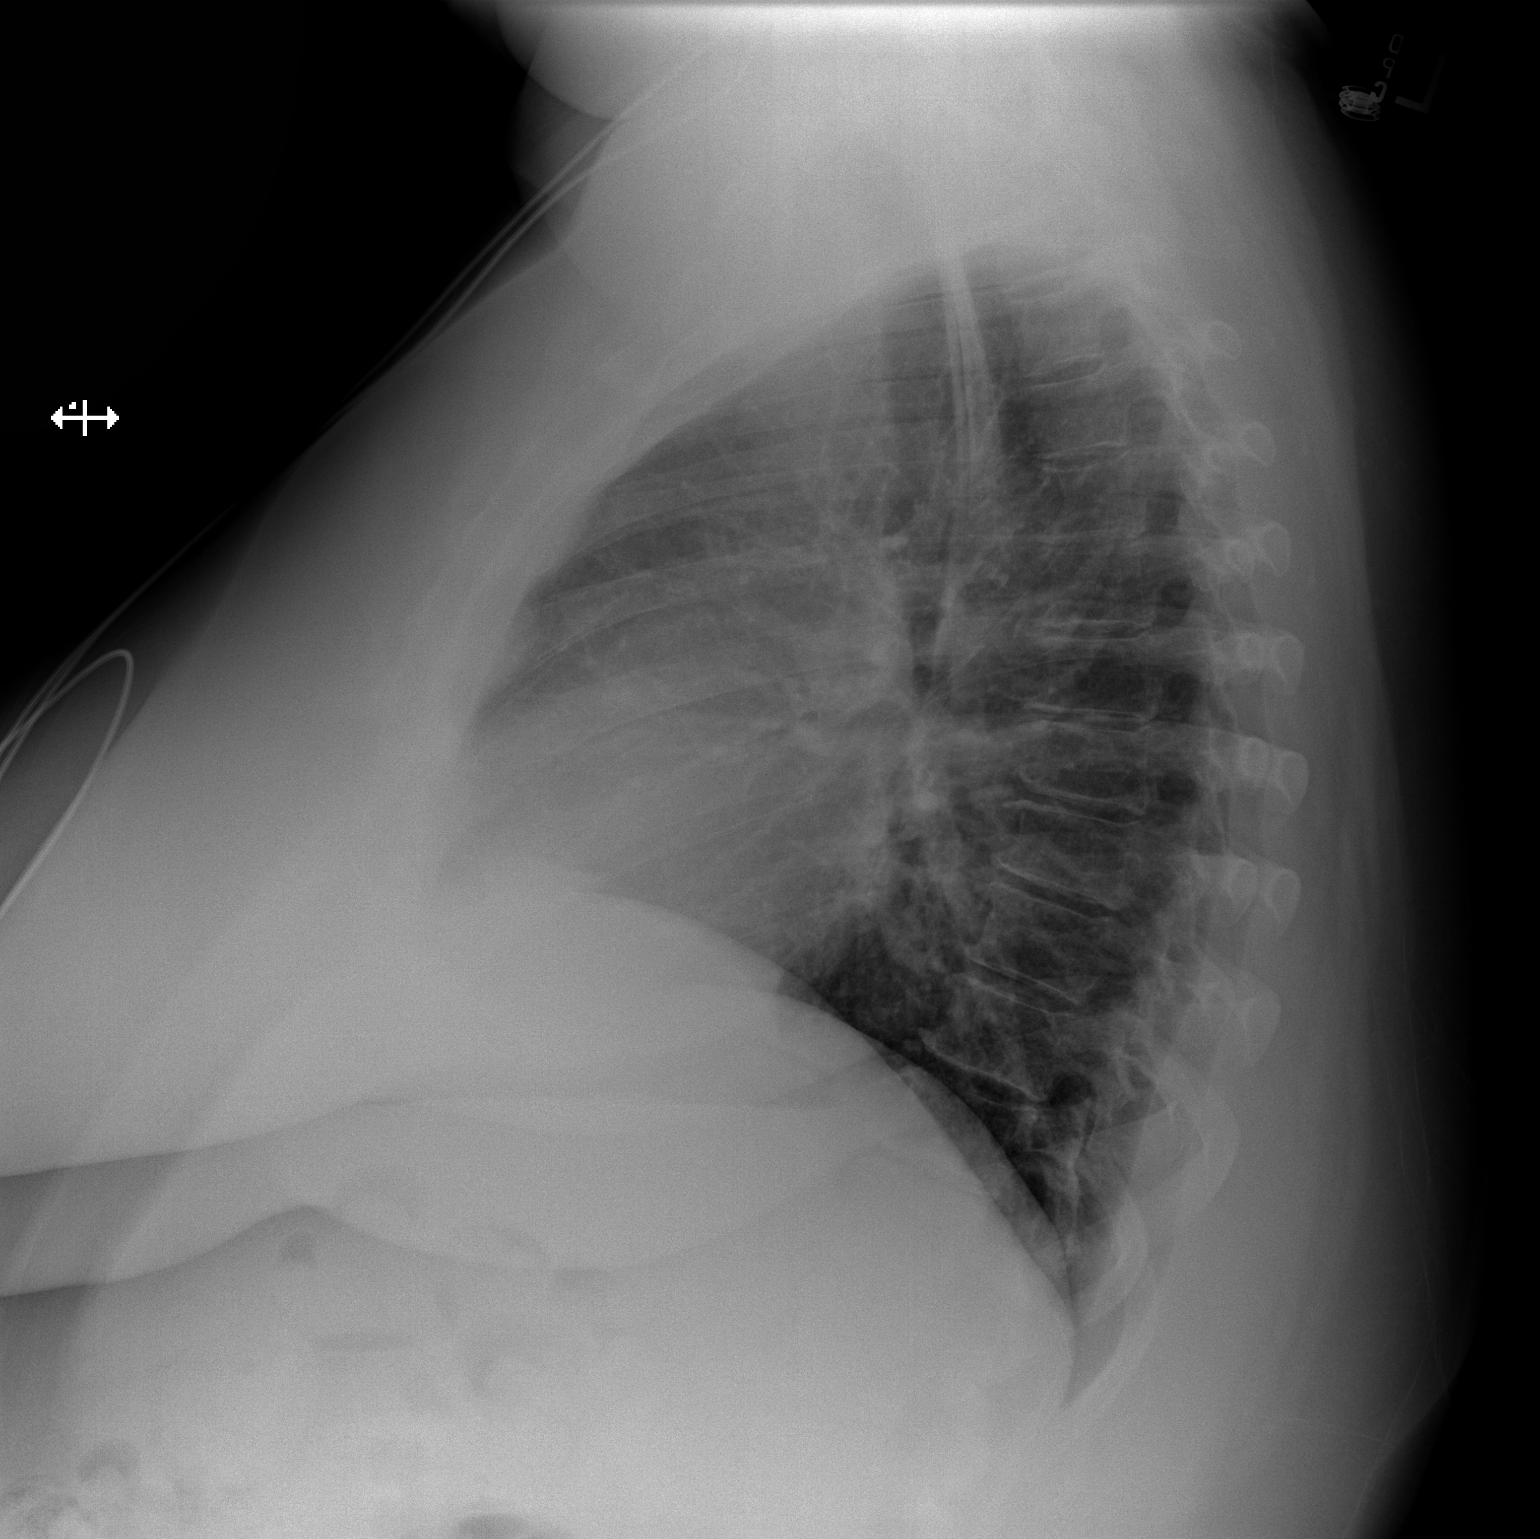

[2 of 2 positions shown; findings below may reference images not displayed]

FINDINGS: The cardiomediastinal contours are normal. The lungs are clear.
Pulmonary vasculature is normal. No consolidation, pleural effusion,
or pneumothorax. No acute osseous abnormalities are seen. There is
degenerative change in the thoracic spine.
IMPRESSION: No active cardiopulmonary disease.

## 2017-12-22 LAB — CBC AND DIFFERENTIAL
HCT: 42 (ref 36–46)
Hemoglobin: 13.7 (ref 12.0–16.0)
Platelets: 244 (ref 150–399)
WBC: 9.2

## 2018-01-01 ENCOUNTER — Telehealth: Payer: Self-pay | Admitting: Neurology

## 2018-01-01 ENCOUNTER — Other Ambulatory Visit: Payer: Self-pay | Admitting: Neurology

## 2018-01-01 NOTE — Telephone Encounter (Signed)
Noted  

## 2018-01-01 NOTE — Progress Notes (Signed)
[facility]  PATIENT: Tina Hunt DOB: 1976-08-28   REASON FOR VISIT: Follow-up for migraine HISTORY FROM: Patient    HISTORY OF PRESENT ILLNESS: Tina Hunt is a 42 years old right-handed female, seen in refer by her primary care Harlan Stains for evaluation of chronic migraine, initial evaluation on December 22 2016.  She had past medical history of obesity, endometriosis, chronic pain, chronic narcotic treatment, I reviewed her registry, she is getting oxycodone/Tylenol 5/325 mg 90 tablets each month, Xanax 0.5 milligrams 30 tablets each months,  She reported a history of migraine since 2010, her typical migraine are triggered by weather change, altitude change, stress, sleep deprivation, hormonal change, starting from the left side of her neck spreading forward to become a left retro-orbital area severe pounding headache with associated light noise sensitivity, nauseous, lasting few hours to half day, her migraine sometimes up proceeded by blurry vision, sweaty sensation in her teeth.  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  She has been taking Topamax 100 mg twice a day for few years, with limited improvement,  I reviewed her report from the [facility] system, a MRV of the brain was normal. CT head without contrast was normal  Endocrine evaluations in December 2017, normal BMP, CBC showed elevated WBC 12.5  hemoglobin of 14 point 1,  UPDATE Feb 21 2017:YY She had nerve block on last visit December 22 2016 which has helped her headache, the benefit lasts about 10 days, she is now taking propanolol 60 mg twice a day Topamax 100 mg twice a day, still has migraine headache about 3-4 times each week, especially with weather  changes, her headache can last up to 2 days, she went to hospital for one prolonged migraine, Phenergan Benadryl, Depacon,  ketamine, cocktail has been helpful  She is now taking Maxalt together with Benadryl, Phenergan, sometimes she has to take Imitrex injection, and sleep usually helps UPDATE 4/9/2019CM Tina Hunt, 41 year old female returns for follow-up with a history of chronic migraines.  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  Her headaches are doing much better.  She also had a total hysterectomy in June of last year.  She is currently on propanolol 60 mg twice daily as preventive and she is concerned about her low  blood pressure at 97/66.  In addition she is on Topamax 100 twice daily.  She also takes Maxalt acutely and sumatriptan injections as necessary.  Weather changes are a big migraine trigger for her.  She returns for reevaluation   REVIEW OF SYSTEMS: Full 14 system review of systems performed and notable only for those listed, all others are neg:  Constitutional: neg  Cardiovascular: neg Ear/Nose/Throat: neg  Skin: neg Eyes: neg Respiratory: neg Gastroitestinal: neg  Hematology/Lymphatic: neg  Endocrine: neg Musculoskeletal:neg Allergy/Immunology: neg Neurological: Occasional dizziness Psychiatric: Depression anxiety Sleep : Insomnia   ALLERGIES: Allergies  Allergen Reactions  . Biaxin [Clarithromycin] Diarrhea and Nausea And Vomiting  . Penicillins Hives    Has patient had a PCN reaction causing immediate rash, facial/tongue/throat swelling, SOB or lightheadedness with hypotension: No Has patient had a PCN reaction causing severe rash involving mucus membranes or skin necrosis: No Has patient had a PCN reaction  that required hospitalization No Has patient had a PCN reaction occurring within the last 10 years: No If all of the above answers are "NO", then may proceed with Cephalosporin use.     HOME  MEDICATIONS: Outpatient Medications Prior to Visit  Medication Sig Dispense Refill  . clonazePAM (KLONOPIN) 0.5 MG tablet 0.5 mg 2 (two) times daily as needed.     . cyclobenzaprine (FLEXERIL) 10 MG tablet Take 10 mg by mouth at bedtime as needed for muscle spasms.    . diphenhydrAMINE (BENADRYL) 25 mg capsule Take 25 mg by mouth every 6 (six) hours as needed for allergies.    . DULoxetine (CYMBALTA) 30 MG capsule Take 90 mg by mouth at bedtime.     Marland Kitchen HYDROCORTISONE ACE, RECTAL, 30 MG SUPP UNWRAP AND INSERT 1 SUPOSSITORY TWICE A DAY  0  . methocarbamol (ROBAXIN) 500 MG tablet Take 1 tablet (500 mg total) by mouth 4 (four) times daily. (Patient taking differently: Take 500 mg by mouth at bedtime as needed for muscle spasms. ) 30 tablet 1  . omeprazole (PRILOSEC) 20 MG capsule Take 20 mg by mouth 2 (two) times daily before a meal.     . ondansetron (ZOFRAN-ODT) 8 MG disintegrating tablet Take 8 mg by mouth every 8 (eight) hours as needed. For GI issues.    Marland Kitchen oxyCODONE-acetaminophen (ROXICET) 5-325 MG tablet 1-2  tablets po every 6  hours prn-moderate pain 30 tablet 0  . promethazine (PHENERGAN) 25 MG tablet Take 1 tablet (25 mg total) by mouth every 6 (six) hours as needed for nausea or vomiting. (Patient taking differently: Take 25 mg by mouth every 6 (six) hours as needed for nausea (migraines). ) 30 tablet 11  . propranolol (INDERAL) 60 MG tablet Take 1 tablet (60 mg total) by mouth 2 (two) times daily. (Patient taking differently: Take 60 mg by mouth 2 (two) times daily. ) 60 tablet 11  . rizatriptan (MAXALT-MLT) 10 MG disintegrating tablet DISSOLVE 1 TABLET ON TONGUE AS NEEDED FOR HEADACHE, MAY REPEAT IN 2 HOURS IF NEEDED. 15 tablet 5  . SUMAtriptan (IMITREX STATDOSE SYSTEM) 6 MG/0.5ML SOAJ Inject 6 mg into the skin at bedtime as needed. (Patient taking differently: Inject 6 mg into the skin daily as needed. ) 12 Syringe 11  . topiramate (TOPAMAX) 100 MG tablet Take 1 tablet (100 mg total) by  mouth 2 (two) times daily. 180 tablet 4  . ursodiol (ACTIGALL) 300 MG capsule Take 300 mg by mouth 2 (two) times daily.     Marland Kitchen ALPRAZolam (XANAX) 0.5 MG tablet Take 0.5 mg by mouth 2 (two) times daily as needed for anxiety.     Marland Kitchen doxycycline (VIBRA-TABS) 100 MG tablet Take 1 tablet (100 mg total) by mouth 2 (two) times daily. (Patient not taking: Reported on 01/02/2018) 20 tablet 0  . ibuprofen (ADVIL,MOTRIN) 600 MG tablet 1 po  pc   every 6 hours for 5 days then as needed for pain (Patient not taking: Reported on 01/02/2018) 30 tablet 1  . MAGNESIUM OXIDE PO Take 1 tablet by mouth daily.    . traZODone (DESYREL) 100 MG tablet Take 100-200 mg by mouth at bedtime.     No facility-administered medications prior to visit.     PAST MEDICAL HISTORY: Past Medical History:  Diagnosis Date  . Anxiety   . Arthritis    "a little in Right knee"  . Depression   . Endometriosis   . Headache(784.0)    MIGRAINES   .  History of blood transfusion 01/10/2003   [facility] - 3 units transfused  . Migraine   . Neck pain    clench teeth at night  . Neuromuscular disorder ([place])    BLACKOUT SPELLS  BUT NONE SINCE 1996   . Pneumonia    hx of 3 years ago  . PONV (postoperative nausea and vomiting)   . Sleep apnea    Does not use CPAP  . 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 ADENOIDECTOMY  1995   with tube placement  . CESAREAN SECTION     2004   . CHROMOPERTUBATION Bilateral 10/09/2015   Procedure: CHROMOPERTUBATION;  Surgeon: Everett Graff, MD;  Location: [facility];  Service: Gynecology;  Laterality: Bilateral;  . CYSTOSCOPY Bilateral 03/20/2017   Procedure: CYSTOSCOPY;  Surgeon: Everett Graff, MD;  Location: [facility];  Service: Gynecology;  Laterality: Bilateral;  . DILITATION & CURRETTAGE/HYSTROSCOPY WITH NOVASURE ABLATION N/A 10/09/2015   Procedure: DILATATION & CURETTAGE/HYSTEROSCOPY;  Surgeon: Everett Graff, MD;  Location: [facility];   Service: Gynecology;  Laterality: N/A;  . KNEE ARTHROSCOPY Left    1988   . KNEE ARTHROSCOPY Left 03/12/2014   Procedure: LEFT ARTHROSCOPY KNEE WITH DEBRIDEMENT;  Surgeon: Gearlean Alf, MD;  Location: [facility];  Service: Orthopedics;  Laterality: Left;  medical and lateral repair menius  . LAPAROSCOPIC OVARIAN CYSTECTOMY Right 10/09/2015   Procedure: LAPAROSCOPIC RIGHT OVARIAN CYSTECTOMIES, LEFT PARATUBAL CYSTECTOMY ;  Surgeon: Everett Graff, MD;  Location: [facility];  Service: Gynecology;  Laterality: Right;  . TONSILLECTOMY  1983, 1985   ADENOID X2 (SEPARATE SURGERY)  TUBE PLACEMENT  . TOTAL LAPAROSCOPIC HYSTERECTOMY WITH SALPINGECTOMY Bilateral 03/20/2017   Procedure: HYSTERECTOMY TOTAL LAPAROSCOPIC WITH BILATERAL SALPINGECTOMY;  Surgeon: Everett Graff, MD;  Location: [facility];  Service: Gynecology;  Laterality: Bilateral;  . TYMPANOSTOMY [place], 2002, 2006, 2009, 2013    FAMILY HISTORY: Family History  Problem Relation Age of Onset  . Diabetes Father   . Heart attack Father   . CAD Father   . Stroke Mother   . Diabetes Paternal Grandfather   . Heart attack Paternal Grandfather   . Hypertension Paternal Grandfather   . Lung cancer Maternal Grandfather     SOCIAL HISTORY: Social History   Socioeconomic History  . Marital status: Married    Spouse name: Not on file  . Number of children: 1  . Years of education: Bachelors  . Highest education level: Not on file  Occupational History  . Not on file  Social Needs  . Financial resource strain: Not on file  . Food insecurity:    Worry: Not on file    Inability: Not on file  . Transportation needs:    Medical: Not on file    Non-medical: Not on file  Tobacco Use  . Smoking status: Never Smoker  . Smokeless tobacco: Never Used  Substance and Sexual Activity  . Alcohol use: Yes    Comment: OCC  . Drug use: No  . Sexual activity: Yes    Birth control/protection: None  Lifestyle  . Physical  activity:    Days per week: Not on file    Minutes per session: Not on file  . Stress: Not on file  Relationships  . Social connections:    Talks on phone: Not on file    Gets together: Not on file    Attends  religious service: Not on file    Active member of club or organization: Not on file    Attends meetings of clubs or organizations: Not on file    Relationship status: Not on file  . Intimate partner violence:    Fear of current or ex partner: Not on file    Emotionally abused: Not on file    Physically abused: Not on file    Forced sexual activity: Not on file  Other Topics Concern  . Not on file  Social History Narrative   Lives at home with husband and son.   Right-handed.   4-6 cups caffeine per day.     PHYSICAL EXAM  Vitals:   01/02/18 1103  BP: 97/66  Pulse: 64  Weight: 284 lb 6.4 oz (129 kg)  Height: 5\' 1"  (1.549 m)   Body mass index is 53.74 kg/m.  Generalized: Well developed, morbidly obese female in no acute distress  Head: normocephalic and atraumatic,. Oropharynx benign  Neck: Supple,  Musculoskeletal: No deformity   Neurological examination   Mentation: Alert oriented to time, place, history taking. Attention span and concentration appropriate. Recent and remote memory intact.  Follows all commands speech and language fluent.   Cranial nerve II-XII: Pupils were equal round reactive to light extraocular movements were full, visual field were full on confrontational test. Facial sensation and strength were normal. hearing was intact to finger rubbing bilaterally. Uvula tongue midline. head turning and shoulder shrug were normal and symmetric.Tongue protrusion into cheek strength was normal. Motor: normal bulk and tone, full strength in the BUE, BLE, fine finger movements normal, no pronator drift. No focal weakness Sensory: normal and symmetric to light touch,  Coordination: finger-nose-finger, heel-to-shin bilaterally, no dysmetria Reflexes:  Symmetric upper and lower, plantar responses were flexor bilaterally. Gait and Station: Rising up from seated position without assistance, normal stance,  moderate stride, good arm swing, smooth turning, able to perform tiptoe, and heel walking without difficulty. Tandem gait is mildly unsteady  DIAGNOSTIC DATA (LABS, IMAGING, TESTING) - I reviewed patient records, labs, notes, testing and imaging myself where available.  Lab Results  Component Value Date   WBC 10.7 (H) 03/26/2017   HGB 13.1 03/26/2017   HCT 39.1 03/26/2017   MCV 94.0 03/26/2017   PLT 259 03/26/2017      Component Value Date/Time   NA 138 03/26/2017 0135   K 4.2 03/26/2017 0135   CL 108 03/26/2017 0135   CO2 23 03/26/2017 0135   GLUCOSE 103 (H) 03/26/2017 0135   BUN 16 03/26/2017 0135   CREATININE 0.86 03/26/2017 0135   CALCIUM 9.4 03/26/2017 0135   GFRNONAA >60 03/26/2017 0135   GFRAA >60 03/26/2017 0135    ASSESSMENT AND PLAN  42 y.o. year old female  has a past medical history of Anxiety, Arthritis, Depression,  Headache(784.0),  Migraine, Neck pain, Neuromuscular disorder ([place]),  Sleep apnea, . here to follow-up for her chronic migraine headaches which are in good control after sleeve gastrectomy and  the loss of 65 pounds.    PLAN: Continue preventive medications Topamax 100 mg twice a day will refill Taper  propanolol 60 mg 1 pill for 2 weeks then discontinue Maxalt as needed for moderate headaches,will refill Imitrex subcutaneous injection, prn will refill Call for increase in headaches F/U yearly and prn Tina Bible, [facility], [facility], APRN  [facility] [address], [place], [place] [address] [phone]

## 2018-01-01 NOTE — Telephone Encounter (Signed)
Pt has appt with Hoyle Sauer tomorrow at 11:15-she is Dr Krista Blue pt for yearly medication f/u. Pt said the HA's have improved since she had a hysterectomy.

## 2018-01-02 ENCOUNTER — Ambulatory Visit (INDEPENDENT_AMBULATORY_CARE_PROVIDER_SITE_OTHER): Payer: BC Managed Care – PPO | Admitting: Nurse Practitioner

## 2018-01-02 ENCOUNTER — Encounter: Payer: Self-pay | Admitting: Nurse Practitioner

## 2018-01-02 VITALS — BP 97/66 | HR 64 | Ht 61.0 in | Wt 284.4 lb

## 2018-01-02 DIAGNOSIS — IMO0002 Reserved for concepts with insufficient information to code with codable children: Secondary | ICD-10-CM

## 2018-01-02 DIAGNOSIS — G43709 Chronic migraine without aura, not intractable, without status migrainosus: Secondary | ICD-10-CM | POA: Diagnosis not present

## 2018-01-02 MED ORDER — SUMATRIPTAN SUCCINATE 6 MG/0.5ML ~~LOC~~ SOAJ: 0.5000 mL | SUBCUTANEOUS | 6 refills | Status: DC | PRN

## 2018-01-02 MED ORDER — RIZATRIPTAN BENZOATE 10 MG PO TBDP: ORAL_TABLET | ORAL | 11 refills | Status: DC

## 2018-01-02 MED ORDER — TOPIRAMATE 100 MG PO TABS: 100.0000 mg | ORAL_TABLET | Freq: Two times a day (BID) | ORAL | 3 refills | Status: DC

## 2018-01-02 NOTE — Patient Instructions (Signed)
Continue preventive medications Topamax 100 mg twice a day will refill Taper  propanolol 60 mg 1 pill for 2 weeks then discontinue Maxalt as needed for moderate headaches,will refill Imitrex subcutaneous injection, prn will refill Call for increase in headaches F/U yearly and prn

## 2018-01-04 NOTE — Progress Notes (Signed)
I have reviewed and agreed above plan. 

## 2018-01-24 ENCOUNTER — Other Ambulatory Visit: Payer: Self-pay | Admitting: Neurology

## 2018-02-05 ENCOUNTER — Other Ambulatory Visit: Payer: Self-pay | Admitting: Physician Assistant

## 2018-02-05 ENCOUNTER — Ambulatory Visit
Admission: RE | Admit: 2018-02-05 | Discharge: 2018-02-05 | Disposition: A | Discharge status: Home / Self Care | Payer: BC Managed Care – PPO | Source: Ambulatory Visit | Attending: Physician Assistant | Admitting: Physician Assistant

## 2018-02-05 DIAGNOSIS — M5440 Lumbago with sciatica, unspecified side: Secondary | ICD-10-CM

## 2018-03-26 ENCOUNTER — Telehealth: Payer: Self-pay | Admitting: Nurse Practitioner

## 2018-03-26 ENCOUNTER — Other Ambulatory Visit: Payer: Self-pay | Admitting: Neurology

## 2018-03-26 NOTE — Telephone Encounter (Signed)
Patient is having an increase in headaches and needs a soon appointment. Her next appointment with Hoyle Sauer is not until 01-09-19.

## 2018-03-26 NOTE — Telephone Encounter (Signed)
Patient can restart propanolol 60 mg, keep check on her blood pressure.  She was on twice that dose previously.  Please call the patient. Check to see if she has any med left or you can refill

## 2018-03-26 NOTE — Telephone Encounter (Signed)
Called patient who stated she's taking Topamax as prescribed, using Maxalt and Imitrex for rescue medications. In past 4-6 weeks she's had increase in migraines. She's seeing chiropractor for neck pain and will go to a different practice for injections in her back. Her neck pain can occur with migraines. She has light sensitivity and 2 weeks of "some stage of a migraine". Dr Krista Blue discussed Botox with her which she may consider, but she doesn't want nerve blocks again. She stated she is not in an emergent situation but is noticing a trend. She would consider restarting propanolol as well. She stated she does not want a migraine cocktail if she can avoid it.  She will come in to discuss treatment options if necessary.  This RN advised will send this message to NP and call her with reply.

## 2018-03-26 NOTE — Telephone Encounter (Addendum)
Spoke with patient and advised Tina Peacock, NP will restart propanolol, taking 60 mg once a day.  Advised she must watch her BP. She stated she has 1 month's of medication remaining, no refills. This RN offered to refill or for her to see if medication is helpful first. She would like to begin medication, and she will call this office back in two weeks to advise how she is doing. She will then get refills if medication has been helpful. She verbalized understanding, appreciation of call.

## 2018-04-24 MED ORDER — PROPRANOLOL HCL 60 MG PO TABS: 60.0000 mg | ORAL_TABLET | Freq: Every day | ORAL | 5 refills | Status: DC

## 2018-04-24 NOTE — Telephone Encounter (Signed)
Patient states propranolol (INDERAL) 60 MG tablet is helping and would like Rx called to [facility].

## 2018-04-24 NOTE — Telephone Encounter (Addendum)
Called patient who stated she has noticed an improvement in her headaches. She has periodically checked her BP with readings such as 116/72, 114/68. She did experience some dizziness when she first began taking propanolol, but it has subsided. This RN advised will refill it, and she is to continue to monitor her BP, call for any problems or questions. She verbalized understanding, appreciation.  Propanolol 60 mg, take one daily refilled x 6 months per NP.

## 2018-04-24 NOTE — Addendum Note (Signed)
Addended by: Minna Antis on: 04/24/2018 04:45 PM   Modules accepted: Orders

## 2018-05-03 ENCOUNTER — Ambulatory Visit (INDEPENDENT_AMBULATORY_CARE_PROVIDER_SITE_OTHER): Payer: BC Managed Care – PPO | Admitting: Adult Health

## 2018-05-03 ENCOUNTER — Encounter: Payer: Self-pay | Admitting: Adult Health

## 2018-05-03 VITALS — BP 94/64 | HR 61 | Ht 61.0 in | Wt 276.9 lb

## 2018-05-03 DIAGNOSIS — Z0000 Encounter for general adult medical examination without abnormal findings: Secondary | ICD-10-CM | POA: Insufficient documentation

## 2018-05-03 DIAGNOSIS — G43709 Chronic migraine without aura, not intractable, without status migrainosus: Secondary | ICD-10-CM | POA: Diagnosis not present

## 2018-05-03 DIAGNOSIS — IMO0002 Reserved for concepts with insufficient information to code with codable children: Secondary | ICD-10-CM

## 2018-05-03 DIAGNOSIS — F329 Major depressive disorder, single episode, unspecified: Secondary | ICD-10-CM

## 2018-05-03 DIAGNOSIS — F32A Depression, unspecified: Secondary | ICD-10-CM

## 2018-05-03 DIAGNOSIS — F419 Anxiety disorder, unspecified: Secondary | ICD-10-CM

## 2018-05-03 MED ORDER — CLONAZEPAM 0.5 MG PO TABS: 0.5000 mg | ORAL_TABLET | Freq: Two times a day (BID) | ORAL | 0 refills | Status: DC | PRN

## 2018-05-03 NOTE — Assessment & Plan Note (Signed)
Please continue all medications as directed. Controlled Substance Contract completed- we will only fill Clonazepam 0.5mg . Office visits required every 3 months to refill. We will hopefull wean you off of this medication once anxiety levels are reducing. Please tell your Dr. Nelva Bush about your substance contract here, since he is providing narcotics to you. Continue to drink plenty of water, follow Mediterranean diet, and continue with regular exercise. Referral to mental health placed. Recommend complete physical in 3 months.

## 2018-05-03 NOTE — Assessment & Plan Note (Signed)
[place] Controlled Substance Database reviewed- She has been filling anxiolytics every 15-30 days >2 years Currently on Clonazepam 0.5mg  Q12H, estimates to use 4-5 days/week She denies sedation with use She denies thoughts of harming herself/others Will not increase dosage or frequency- ultimate plan is to wean her off Controlled Substance Contract Completed- she needs to inform Dr. Nelva Bush of contract with our clinic since he will be provided narcotic pain medication  Referral to mental health placed

## 2018-05-03 NOTE — Patient Instructions (Addendum)
Mediterranean Diet A Mediterranean diet refers to food and lifestyle choices that are based on the traditions of countries located on the [place] Sea. This way of eating has been shown to help prevent certain conditions and improve outcomes for people who have chronic diseases, like kidney disease and heart disease. What are tips for following this plan? Lifestyle  Cook and eat meals together with your family, when possible.  Drink enough fluid to keep your urine clear or pale yellow.  Be physically active every day. This includes: ? Aerobic exercise like running or swimming. ? Leisure activities like gardening, walking, or housework.  Get 7-8 hours of sleep each night.  If recommended by your health care provider, drink red wine in moderation. This means 1 glass a day for nonpregnant women and 2 glasses a day for men. A glass of wine equals 5 oz (150 mL). Reading food labels  Check the serving size of packaged foods. For foods such as rice and pasta, the serving size refers to the amount of cooked product, not dry.  Check the total fat in packaged foods. Avoid foods that have saturated fat or trans fats.  Check the ingredients list for added sugars, such as corn syrup. Shopping  At the grocery store, buy most of your food from the areas near the walls of the store. This includes: ? Fresh fruits and vegetables (produce). ? Grains, beans, nuts, and seeds. Some of these may be available in unpackaged forms or large amounts (in bulk). ? Fresh seafood. ? Poultry and eggs. ? Low-fat dairy products.  Buy whole ingredients instead of prepackaged foods.  Buy fresh fruits and vegetables in-season from local farmers markets.  Buy frozen fruits and vegetables in resealable bags.  If you do not have access to quality fresh seafood, buy precooked frozen shrimp or canned fish, such as tuna, salmon, or sardines.  Buy small amounts of raw or cooked vegetables, salads, or olives from the  deli or salad bar at your store.  Stock your pantry so you always have certain foods on hand, such as olive oil, canned tuna, canned tomatoes, rice, pasta, and beans. Cooking  Cook foods with extra-virgin olive oil instead of using butter or other vegetable oils.  Have meat as a side dish, and have vegetables or grains as your main dish. This means having meat in small portions or adding small amounts of meat to foods like pasta or stew.  Use beans or vegetables instead of meat in common dishes like chili or lasagna.  Experiment with different cooking methods. Try roasting or broiling vegetables instead of steaming or sauteing them.  Add frozen vegetables to soups, stews, pasta, or rice.  Add nuts or seeds for added healthy fat at each meal. You can add these to yogurt, salads, or vegetable dishes.  Marinate fish or vegetables using olive oil, lemon juice, garlic, and fresh herbs. Meal planning  Plan to eat 1 vegetarian meal one day each week. Try to work up to 2 vegetarian meals, if possible.  Eat seafood 2 or more times a week.  Have healthy snacks readily available, such as: ? Vegetable sticks with hummus. ? Greek yogurt. ? Fruit and nut trail mix.  Eat balanced meals throughout the week. This includes: ? Fruit: 2-3 servings a day ? Vegetables: 4-5 servings a day ? Low-fat dairy: 2 servings a day ? Fish, poultry, or lean meat: 1 serving a day ? Beans and legumes: 2 or more servings a week ? Nuts   and seeds: 1-2 servings a day ? Whole grains: 6-8 servings a day ? Extra-virgin olive oil: 3-4 servings a day  Limit red meat and sweets to only a few servings a month What are my food choices?  Mediterranean diet ? Recommended ? Grains: Whole-grain pasta. Brown rice. Bulgar wheat. Polenta. Couscous. Whole-wheat bread. [place]. ? Vegetables: Artichokes. Beets. Broccoli. Cabbage. Carrots. Eggplant. Green beans. Chard. Kale. Spinach. Onions. Leeks. Peas. Squash.  Tomatoes. Peppers. Radishes. ? Fruits: Apples. Apricots. Avocado. Berries. Bananas. Cherries. Dates. Figs. Grapes. Lemons. Melon. Oranges. Peaches. Plums. Pomegranate. ? Meats and other protein foods: Beans. Almonds. Sunflower seeds. Pine nuts. Peanuts. [place]. Salmon. [place]all[place]. Shrimp. [place]. Tilapia. Clams. Oysters. Eggs. ? Dairy: Low-fat milk. Cheese. Greek yogurt. ? Beverages: Water. Red wine. Herbal tea. ? Fats and oils: Extra virgin olive oil. Avocado oil. Grape seed oil. ? Sweets and desserts: [place] yogurt with honey. Baked apples. Poached pears. Trail mix. ? Seasoning and other foods: Basil. Cilantro. Coriander. Cumin. Mint. Parsley. Sage. Rosemary. Tarragon. Garlic. Oregano. Thyme. Pepper. Balsalmic vinegar. Tahini. Hummus. Tomato sauce. Olives. Mushrooms. ? Limit these ? Grains: Prepackaged pasta or rice dishes. Prepackaged cereal with added sugar. ? Vegetables: Deep fried potatoes (french fries). ? Fruits: Fruit canned in syrup. ? Meats and other protein foods: Beef. Pork. Lamb. Poultry with skin. Hot dogs. [place] Salines. ? Dairy: Ice cream. Sour cream. Whole milk. ? Beverages: Juice. Sugar-sweetened soft drinks. Beer. Liquor and spirits. ? Fats and oils: Butter. Canola oil. Vegetable oil. Beef fat (tallow). Lard. ? Sweets and desserts: Cookies. Cakes. Pies. Candy. ? Seasoning and other foods: Mayonnaise. Premade sauces and marinades. ? The items listed may not be a complete list. Talk with your dietitian about what dietary choices are right for you. Summary  The Mediterranean diet includes both food and lifestyle choices.  Eat a variety of fresh fruits and vegetables, beans, nuts, seeds, and whole grains.  Limit the amount of red meat and sweets that you eat.  Talk with your health care provider about whether it is safe for you to drink red wine in moderation. This means 1 glass a day for nonpregnant women and 2 glasses a day for men. A glass of wine equals 5 oz (150 mL). This information  is not intended to replace advice given to you by your health care provider. Make sure you discuss any questions you have with your health care provider. Document Released: 05/05/2016 Document Revised: 06/07/2016 Document Reviewed: 05/05/2016 Elsevier Interactive Patient Education  [employer].  Please continue all medications as directed. Controlled Substance Contract completed- we will only fill Clonazepam 0.5mg . Office visits required every 3 months to refill. We will hopefull wean you off of this medication once anxiety levels are reducing. Please tell your Dr. Nelva Bush about your substance contract here, since he is providing narcotics to you. Continue to drink plenty of water, follow Mediterranean diet, and continue with regular exercise. Referral to mental health placed. Recommend complete physical in 3 months. WELCOME TO THE PRATICE!

## 2018-05-03 NOTE — Assessment & Plan Note (Signed)
Followed by Neurology Q6M Currently on Topiramate 100mg  BID and sumatriptan injection as needed Migraine with aura

## 2018-05-03 NOTE — Progress Notes (Signed)
Subjective:    Patient ID: Tina Hunt, female    DOB: 19-Feb-1976, 42 y.o.   MRN: [id number]  HPI:  Tina Hunt is here to establish as a new pt.  She is a pleasant 42 year old female. PMH: Chronic anxiety/depression- currently on Cymbalta 90mg  QHS and Clonazepam 0.5mg  Q12H PRN Chronic migraine with aura- estimates 2-4 migraines/month-managed by Neurology Chronic cervical neck/lumbar back pain- she recently switched from pain clinic to Dr. Nelva Bush for injections and oral narcotics. She underwent Bariatric Surgery Jan 2019- [facility] She has lost >80 lbs She follows heart healthy diet and achieves >10K steps/day, wt training 3 times/week, and "pool walking" 3 times/week. She denies tobacco use and seldom drinks ETOH  She reports sig anxiety from current financial struggles and the sudden death of her mother 2 years ago. She attended a Grief Share session but reports "it didn't help much". She has not seen therapist >20 years She reports feeling "more down the last few weeks b/c it was the anniversary of my mother's death" She denies thoughts of harming herself/others She is married with one child- son age 80 She reports strong support system of family (her sister, husband) and local friends. She is a Environmental consultant at [employer]  Patient Care Team    Relationship Specialty Notifications Start End  Danford, Valetta Fuller D, NP PCP - General Family Medicine  05/03/18   Marcial Pacas, MD Consulting Physician Neurology  05/03/18   Suella Broad, MD Consulting Physician Physical Medicine and Rehabilitation  05/03/18   Harriett Sine, MD Consulting Physician Dermatology  05/03/18   Everett Graff, MD Consulting Physician Obstetrics and Gynecology  05/03/18   [place], Heather, [place]  Optometry  05/03/18   Heron Nay, MD  Surgery  05/03/18   Marrion Coy, MD Referring Physician Chiropractic Medicine  05/03/18     Patient Active Problem List   Diagnosis Date Noted  . Healthcare maintenance 05/03/2018  . Anxiety  05/03/2018  . Depression 05/03/2018  . Acute post-operative pain 03/26/2017  . Chronic migraine 12/22/2016  . Migraine 12/22/2016  . Acute medial meniscal tear 03/11/2014  . Eustachian tube dysfunction 11/28/2011     Past Medical History:  Diagnosis Date  . Anxiety   . Arthritis    "a little in Right knee"  . Chronic back pain   . Depression   . Endometriosis   . Headache(784.0)    MIGRAINES   . History of blood transfusion 01/10/2003   [place] - 3 units transfused  . Migraine   . Neck pain    clench teeth at night  . Neuromuscular disorder ([place])    BLACKOUT SPELLS  BUT NONE SINCE 1996   . Pneumonia    hx of 3 years ago  . PONV (postoperative nausea and vomiting)   . Recurrent sinusitis   . Sleep apnea    Does not use CPAP  . 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 ADENOIDECTOMY  1995   with tube placement  . CESAREAN SECTION     2004   . CHROMOPERTUBATION Bilateral 10/09/2015   Procedure: CHROMOPERTUBATION;  Surgeon: Everett Graff, MD;  Location: [facility];  Service: Gynecology;  Laterality: Bilateral;  . CYSTOSCOPY Bilateral 03/20/2017   Procedure: CYSTOSCOPY;  Surgeon: Everett Graff, MD;  Location: [facility];  Service: Gynecology;  Laterality: Bilateral;  . DILITATION & CURRETTAGE/HYSTROSCOPY WITH NOVASURE ABLATION N/A 10/09/2015   Procedure: DILATATION &  CURETTAGE/HYSTEROSCOPY;  Surgeon: Everett Graff, MD;  Location: [facility];  Service: Gynecology;  Laterality: N/A;  . KNEE ARTHROSCOPY Left    1988   . KNEE ARTHROSCOPY Left 03/12/2014   Procedure: LEFT ARTHROSCOPY KNEE WITH DEBRIDEMENT;  Surgeon: Gearlean Alf, MD;  Location: [facility];  Service: Orthopedics;  Laterality: Left;  medical and lateral repair menius  . LAPAROSCOPIC OVARIAN CYSTECTOMY Right 10/09/2015   Procedure: LAPAROSCOPIC RIGHT OVARIAN CYSTECTOMIES, LEFT PARATUBAL CYSTECTOMY ;  Surgeon: Everett Graff, MD;  Location: [facility];  Service: Gynecology;   Laterality: Right;  . SLEEVE GASTROPLASTY    . TONSILLECTOMY  1983, 1985   ADENOID X2 (SEPARATE SURGERY)  TUBE PLACEMENT  . TOTAL LAPAROSCOPIC HYSTERECTOMY WITH SALPINGECTOMY Bilateral 03/20/2017   Procedure: HYSTERECTOMY TOTAL LAPAROSCOPIC WITH BILATERAL SALPINGECTOMY;  Surgeon: Everett Graff, MD;  Location: [facility];  Service: Gynecology;  Laterality: Bilateral;  . TYMPANOSTOMY [place], 2002, 2006, 2009, 2013     Family History  Problem Relation Age of Onset  . Diabetes Father   . Heart attack Father   . CAD Father   . Alcohol abuse Father   . Stroke Mother   . Diabetes Paternal Grandfather   . Heart attack Paternal Grandfather   . Hypertension Paternal Grandfather   . Alcohol abuse Paternal Grandfather   . Lung cancer Maternal Grandfather      Social History   Substance and Sexual Activity  Drug Use No     Social History   Substance and Sexual Activity  Alcohol Use Yes  . Alcohol/week: 1.0 standard drinks  . Types: 1 Glasses of wine per week     Social History   Tobacco Use  Smoking Status Never Smoker  Smokeless Tobacco Never Used     Outpatient Encounter Medications as of 05/03/2018  Medication Sig Note  . calcium carbonate (TUMS - DOSED IN MG ELEMENTAL CALCIUM) 500 MG chewable tablet Chew 1 tablet by mouth daily.   . clonazePAM (KLONOPIN) 0.5 MG tablet Take 1 tablet (0.5 mg total) by mouth 2 (two) times daily as needed.   . cyclobenzaprine (FLEXERIL) 10 MG tablet Take 10 mg by mouth at bedtime as needed for muscle spasms. 09/19/2016: Alternates with methocarbamol.   . diphenhydrAMINE (BENADRYL) 25 mg capsule Take 25 mg by mouth every 6 (six) hours as needed for allergies.   . DULoxetine (CYMBALTA) 30 MG capsule Take 90 mg by mouth at bedtime.    . methocarbamol (ROBAXIN) 500 MG tablet Take 1 tablet (500 mg total) by mouth 4 (four) times daily. (Patient taking differently: Take 500 mg by mouth at bedtime as needed for muscle spasms. )  03/09/2017: Rotates with flexeril   . Multiple Vitamin (MULTIVITAMIN) tablet Take 1 tablet by mouth daily.   Marland Kitchen omeprazole (PRILOSEC) 20 MG capsule Take 20 mg by mouth 2 (two) times daily before a meal.    . ondansetron (ZOFRAN-ODT) 8 MG disintegrating tablet Take 8 mg by mouth every 8 (eight) hours as needed. For GI issues.   Marland Kitchen oxyCODONE-acetaminophen (ROXICET) 5-325 MG tablet 1-2  tablets po every 6  hours prn-moderate pain   . promethazine (PHENERGAN) 25 MG tablet Take 1 tablet (25 mg total) by mouth every 6 (six) hours as needed for nausea or vomiting. Must last 30 days.   . propranolol (INDERAL) 60 MG tablet Take 1 tablet (60 mg total) by mouth daily.   . rizatriptan (MAXALT-MLT) 10 MG disintegrating tablet DISSOLVE 1 TABLET ON TONGUE AS NEEDED FOR  HEADACHE, MAY REPEAT IN 2 HOURS IF NEEDED.no more than 2 in 24 hours   . SUMAtriptan (IMITREX STATDOSE SYSTEM) 6 MG/0.5ML SOAJ Inject 0.5 mLs into the skin as needed (use if maxalt fails).   . thiamine (VITAMIN B-1) 100 MG tablet Take 1 tablet by mouth daily.   Marland Kitchen topiramate (TOPAMAX) 100 MG tablet Take 1 tablet (100 mg total) by mouth 2 (two) times daily.   . ursodiol (ACTIGALL) 300 MG capsule Take 300 mg by mouth 2 (two) times daily.    . [DISCONTINUED] clonazePAM (KLONOPIN) 0.5 MG tablet 0.5 mg 2 (two) times daily as needed.    . [DISCONTINUED] HYDROCORTISONE ACE, RECTAL, 30 MG SUPP UNWRAP AND INSERT 1 SUPOSSITORY TWICE A DAY    No facility-administered encounter medications on file as of 05/03/2018.     Allergies: Tape; Aspirin; Biaxin [clarithromycin]; Nsaids; and Penicillins  Body mass index is 52.32 kg/m.  Blood pressure 94/64, pulse 61, height 5\' 1"  (1.549 m), weight 276 lb 14.4 oz (125.6 kg), SpO2 92 %.  Review of Systems  Constitutional: Positive for fatigue. Negative for activity change, appetite change, chills, diaphoresis, fever and unexpected weight change.  HENT: Negative for congestion.   Eyes: Negative for visual disturbance.   Respiratory: Negative for cough, chest tightness, shortness of breath, wheezing and stridor.   Cardiovascular: Negative for chest pain, palpitations and leg swelling.  Gastrointestinal: Negative for abdominal distention, abdominal pain, blood in stool, constipation, diarrhea, nausea and vomiting.  Genitourinary: Negative for difficulty urinating and flank pain.  Musculoskeletal: Positive for arthralgias, back pain, gait problem, joint swelling, myalgias, neck pain and neck stiffness.  Skin: Negative for color change, pallor, rash and wound.  Neurological: Positive for headaches. Negative for dizziness.  Hematological: Does not bruise/bleed easily.  Psychiatric/Behavioral: Positive for dysphoric mood and sleep disturbance. Negative for agitation, behavioral problems, confusion, decreased concentration, hallucinations, self-injury and suicidal ideas. The patient is nervous/anxious. The patient is not hyperactive.        Objective:   Physical Exam  Constitutional: She is oriented to person, place, and time. She appears well-developed and well-nourished. No distress.  HENT:  Head: Normocephalic and atraumatic.  Right Ear: External ear normal.  Left Ear: External ear normal.  Nose: Nose normal.  Mouth/Throat: Oropharynx is clear and moist.  Cardiovascular: Normal rate, regular rhythm, normal heart sounds and intact distal pulses.  No murmur heard. Pulmonary/Chest: Effort normal and breath sounds normal. No stridor. No respiratory distress. She has no wheezes. She has no rales. She exhibits no tenderness.  Neurological: She is alert and oriented to person, place, and time.  Skin: Skin is warm and dry. Capillary refill takes less than 2 seconds. No rash noted. She is not diaphoretic. No erythema. No pallor.  Psychiatric: She has a normal mood and affect. Her speech is normal and behavior is normal. Judgment and thought content normal. She is not actively hallucinating. Cognition and memory are  normal.  Well groomed Tearful at times when speaking about her mother's sudden death She is attentive.      Assessment & Plan:   1. Anxiety and depression   2. Healthcare maintenance   3. Chronic migraine   4. Anxiety   5. Depression, unspecified depression type     Healthcare maintenance Please continue all medications as directed. Controlled Substance Contract completed- we will only fill Clonazepam 0.5mg . Office visits required every 3 months to refill. We will hopefull wean you off of this medication once anxiety levels are reducing. Please tell  your Dr. Nelva Bush about your substance contract here, since he is providing narcotics to you. Continue to drink plenty of water, follow Mediterranean diet, and continue with regular exercise. Referral to mental health placed. Recommend complete physical in 3 months.  Chronic migraine Followed by Neurology Q6M Currently on Topiramate 100mg  BID and sumatriptan injection as needed Migraine with aura  Anxiety [place] Controlled Substance Database reviewed- She has been filling anxiolytics every 15-30 days >2 years Currently on Clonazepam 0.5mg  Q12H, estimates to use 4-5 days/week She denies sedation with use She denies thoughts of harming herself/others Will not increase dosage or frequency- ultimate plan is to wean her off Controlled Substance Contract Completed- she needs to inform Dr. Nelva Bush of contract with our clinic since he will be provided narcotic pain medication  Referral to mental health placed  Depression Mental health referral placed Currently on Cymbalta 90mg  QHS Denies thoughts of harming herself/others    Pt was in the office today for 50+ minutes, I spent over 50% of time in face to face counseling of various medical concerns and in coordination of care FOLLOW-UP:  Return in about 3 months (around 08/03/2018) for CPE.

## 2018-05-03 NOTE — Assessment & Plan Note (Signed)
Mental health referral placed Currently on Cymbalta 90mg  QHS Denies thoughts of harming herself/others

## 2018-05-04 ENCOUNTER — Encounter: Payer: Self-pay | Admitting: Adult Health

## 2018-05-10 ENCOUNTER — Other Ambulatory Visit: Payer: Self-pay | Admitting: Adult Health

## 2018-05-10 NOTE — Telephone Encounter (Signed)
We have not prescribed these medications for the patient previously.  Please review and refill if appropriate.  T. Nelson, CMA  

## 2018-05-13 NOTE — Telephone Encounter (Signed)
Good Afternoon Tonya, Can you please call Ms. Hessling and tell her to request Robaxin from Dr. Nelva Bush- he is now managing her pain. Thanks! Valetta Fuller

## 2018-05-14 NOTE — Telephone Encounter (Signed)
MyChart message sent to pt.  T. Nelson, CMA 

## 2018-05-29 ENCOUNTER — Emergency Department (HOSPITAL_COMMUNITY)
Admission: EM | Admit: 2018-05-29 | Discharge: 2018-05-29 | Disposition: A | Discharge status: Home / Self Care | Payer: BC Managed Care – PPO
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
## 2018-06-20 ENCOUNTER — Other Ambulatory Visit: Payer: Self-pay | Admitting: Adult Health

## 2018-06-20 NOTE — Telephone Encounter (Signed)
Good Morning Tonya Can you please check DEA registry and also ask Ms. Patman if she has updated Dr. Nelva Bush that she is requesting Benzodiazepine's from a diff provider. She has controlled substance contract with Dr. Nelva Bush. Thanks! Valetta Fuller

## 2018-06-20 NOTE — Telephone Encounter (Signed)
[place] Controlled Substance Database report printed and placed on Tina Hunt's desk for review.  LVM for pt to call to discuss contract with Dr. Nelva Bush.  Charyl Bigger, CMA

## 2018-06-21 NOTE — Telephone Encounter (Signed)
Weaning off

## 2018-06-21 NOTE — Telephone Encounter (Signed)
VM box full and cannot accept messages.  Charyl Bigger, CMA

## 2018-07-20 ENCOUNTER — Other Ambulatory Visit: Payer: Self-pay | Admitting: Adult Health

## 2018-07-20 NOTE — Telephone Encounter (Signed)
Patient called to request provider refill Rx for :    clonazePAM (KLONOPIN) 0.5 MG tablet [[id number]]   Order Details  Dose, Route, Frequency: As Directed   Dispense Quantity: 30 tablet Refills: 0 Fills remaining: --        Sig: Once daily as needed for acute anxiety     --Advised provider out of office today but would forward message to medical assistant for review on Monday.--pls allow 48hrs for Rx refills.  --glh

## 2018-07-23 NOTE — Telephone Encounter (Signed)
We have not prescribed these medications for the patient previously.  Please review and refill if appropriate.  T. Nelson, CMA  

## 2018-07-23 NOTE — Telephone Encounter (Signed)
Future appt's are with Dr. Raliegh Scarlet, please have her request refills from her Thanks! Valetta Fuller

## 2018-07-24 ENCOUNTER — Other Ambulatory Visit: Payer: Self-pay | Admitting: Adult Health

## 2018-07-24 MED ORDER — CLONAZEPAM 0.5 MG PO TABS: ORAL_TABLET | ORAL | 0 refills | Status: DC

## 2018-08-02 NOTE — Progress Notes (Deleted)
Subjective:    Patient ID: Tina Hunt, female    DOB: [birth year], 42 y.o.   MRN: [id number]  HPI:05/03/18 OV:   Tina Hunt is here to establish as a new pt.  She is a pleasant 42 year old female. PMH: Chronic anxiety/depression- currently on Cymbalta 90mg  QHS and Clonazepam 0.5mg  Q12H PRN Chronic migraine with aura- estimates 2-4 migraines/month-managed by Neurology Chronic cervical neck/lumbar back pain- she recently switched from pain clinic to Dr. Nelva Bush for injections and oral narcotics. She underwent Bariatric Surgery Jan 2019- [facility] She has lost >80 lbs She follows heart healthy diet and achieves >10K steps/day, wt training 3 times/week, and "pool walking" 3 times/week. She denies tobacco use and seldom drinks ETOH  She reports sig anxiety from current financial struggles and the sudden death of her mother 2 years ago. She attended a Grief Share session but reports "it didn't help much". She has not seen therapist >20 years She reports feeling "more down the last few weeks b/c it was the anniversary of my mother's death" She denies thoughts of harming herself/others She is married with one child- son age 21 She reports strong support system of family (her sister, husband) and local friends. She is a Environmental consultant at [employer]  08/06/18 OV: Tina Hunt is here for   Patient Care Team    Relationship Specialty Notifications Start End  Mina Marble D, NP PCP - General Family Medicine  05/03/18   Marcial Pacas, MD Consulting Physician Neurology  05/03/18   Suella Broad, MD Consulting Physician Physical Medicine and Rehabilitation  05/03/18   Harriett Sine, MD Consulting Physician Dermatology  05/03/18   Everett Graff, MD Consulting Physician Obstetrics and Gynecology  05/03/18   [place], Heather, [place]  Optometry  05/03/18   Heron Nay, MD  Surgery  05/03/18   Marrion Coy, MD Referring Physician Chiropractic Medicine  05/03/18   Harlan Stains, MD Attending Physician Family Medicine   05/03/18     Patient Active Problem List   Diagnosis Date Noted  . Healthcare maintenance 05/03/2018  . Anxiety 05/03/2018  . Depression 05/03/2018  . Acute post-operative pain 03/26/2017  . Chronic migraine 12/22/2016  . Migraine 12/22/2016  . Acute medial meniscal tear 03/11/2014  . Eustachian tube dysfunction 11/28/2011     Past Medical History:  Diagnosis Date  . Anxiety   . Arthritis    "a little in Right knee"  . Chronic back pain   . Depression   . Endometriosis   . Headache(784.0)    MIGRAINES   . History of blood transfusion 01/10/2003   [place] - 3 units transfused  . Migraine   . Neck pain    clench teeth at night  . Neuromuscular disorder ([place])    BLACKOUT SPELLS  BUT NONE SINCE 1996   . Pneumonia    hx of 3 years ago  . PONV (postoperative nausea and vomiting)   . Recurrent sinusitis   . Sleep apnea    Does not use CPAP  . 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 ADENOIDECTOMY  1995   with tube placement  . CESAREAN SECTION     2004   . CHROMOPERTUBATION Bilateral 10/09/2015   Procedure: CHROMOPERTUBATION;  Surgeon: Everett Graff, MD;  Location: [facility];  Service: Gynecology;  Laterality: Bilateral;  . CYSTOSCOPY Bilateral 03/20/2017   Procedure: CYSTOSCOPY;  Surgeon: Everett Graff, MD;  Location: [facility];  Service: Gynecology;  Laterality: Bilateral;  . DILITATION & CURRETTAGE/HYSTROSCOPY WITH NOVASURE ABLATION N/A 10/09/2015   Procedure: DILATATION & CURETTAGE/HYSTEROSCOPY;  Surgeon: Everett Graff, MD;  Location: [facility];  Service: Gynecology;  Laterality: N/A;  . KNEE ARTHROSCOPY Left    1988   . KNEE ARTHROSCOPY Left 03/12/2014   Procedure: LEFT ARTHROSCOPY KNEE WITH DEBRIDEMENT;  Surgeon: Gearlean Alf, MD;  Location: [facility];  Service: Orthopedics;  Laterality: Left;  medical and lateral repair menius  . LAPAROSCOPIC OVARIAN CYSTECTOMY Right 10/09/2015   Procedure: LAPAROSCOPIC RIGHT OVARIAN  CYSTECTOMIES, LEFT PARATUBAL CYSTECTOMY ;  Surgeon: Everett Graff, MD;  Location: [facility];  Service: Gynecology;  Laterality: Right;  . SLEEVE GASTROPLASTY    . TONSILLECTOMY  1983, 1985   ADENOID X2 (SEPARATE SURGERY)  TUBE PLACEMENT  . TOTAL LAPAROSCOPIC HYSTERECTOMY WITH SALPINGECTOMY Bilateral 03/20/2017   Procedure: HYSTERECTOMY TOTAL LAPAROSCOPIC WITH BILATERAL SALPINGECTOMY;  Surgeon: Everett Graff, MD;  Location: [facility];  Service: Gynecology;  Laterality: Bilateral;  . TYMPANOSTOMY [place], 2002, 2006, 2009, 2013     Family History  Problem Relation Age of Onset  . Diabetes Father   . Heart attack Father   . CAD Father   . Alcohol abuse Father   . Stroke Mother   . Diabetes Paternal Grandfather   . Heart attack Paternal Grandfather   . Hypertension Paternal Grandfather   . Alcohol abuse Paternal Grandfather   . Lung cancer Maternal Grandfather      Social History   Substance and Sexual Activity  Drug Use No     Social History   Substance and Sexual Activity  Alcohol Use Yes  . Alcohol/week: 1.0 standard drinks  . Types: 1 Glasses of wine per week     Social History   Tobacco Use  Smoking Status Never Smoker  Smokeless Tobacco Never Used     Outpatient Encounter Medications as of 08/06/2018  Medication Sig Note  . calcium carbonate (TUMS - DOSED IN MG ELEMENTAL CALCIUM) 500 MG chewable tablet Chew 1 tablet by mouth daily.   . clonazePAM (KLONOPIN) 0.5 MG tablet Once daily as needed for acute anxiety   . cyclobenzaprine (FLEXERIL) 10 MG tablet Take 10 mg by mouth at bedtime as needed for muscle spasms. 09/19/2016: Alternates with methocarbamol.   . diphenhydrAMINE (BENADRYL) 25 mg capsule Take 25 mg by mouth every 6 (six) hours as needed for allergies.   . DULoxetine (CYMBALTA) 30 MG capsule TAKE 3 CAPSULES ONCE DAILY.   . methocarbamol (ROBAXIN) 500 MG tablet Take 1 tablet (500 mg total) by mouth 4 (four) times daily. (Patient  taking differently: Take 500 mg by mouth at bedtime as needed for muscle spasms. ) 03/09/2017: Rotates with flexeril   . Multiple Vitamin (MULTIVITAMIN) tablet Take 1 tablet by mouth daily.   Marland Kitchen omeprazole (PRILOSEC) 20 MG capsule Take 20 mg by mouth 2 (two) times daily before a meal.    . ondansetron (ZOFRAN-ODT) 8 MG disintegrating tablet Take 8 mg by mouth every 8 (eight) hours as needed. For GI issues.   Marland Kitchen oxyCODONE-acetaminophen (ROXICET) 5-325 MG tablet 1-2  tablets po every 6  hours prn-moderate pain   . promethazine (PHENERGAN) 25 MG tablet Take 1 tablet (25 mg total) by mouth every 6 (six) hours as needed for nausea or vomiting. Must last 30 days.   . propranolol (INDERAL) 60 MG tablet Take 1 tablet (60 mg total) by mouth daily.   . rizatriptan (MAXALT-MLT)  10 MG disintegrating tablet DISSOLVE 1 TABLET ON TONGUE AS NEEDED FOR HEADACHE, MAY REPEAT IN 2 HOURS IF NEEDED.no more than 2 in 24 hours   . SUMAtriptan (IMITREX STATDOSE SYSTEM) 6 MG/0.5ML SOAJ Inject 0.5 mLs into the skin as needed (use if maxalt fails).   . thiamine (VITAMIN B-1) 100 MG tablet Take 1 tablet by mouth daily.   Marland Kitchen topiramate (TOPAMAX) 100 MG tablet Take 1 tablet (100 mg total) by mouth 2 (two) times daily.   . ursodiol (ACTIGALL) 300 MG capsule Take 300 mg by mouth 2 (two) times daily.     No facility-administered encounter medications on file as of 08/06/2018.     Allergies: Tape; Aspirin; Biaxin [clarithromycin]; Nsaids; and Penicillins  There is no height or weight on file to calculate BMI.  There were no vitals taken for this visit.  Review of Systems  Constitutional: Positive for fatigue. Negative for activity change, appetite change, chills, diaphoresis, fever and unexpected weight change.  HENT: Negative for congestion.   Eyes: Negative for visual disturbance.  Respiratory: Negative for cough, chest tightness, shortness of breath, wheezing and stridor.   Cardiovascular: Negative for chest pain,  palpitations and leg swelling.  Gastrointestinal: Negative for abdominal distention, abdominal pain, blood in stool, constipation, diarrhea, nausea and vomiting.  Genitourinary: Negative for difficulty urinating and flank pain.  Musculoskeletal: Positive for arthralgias, back pain, gait problem, joint swelling, myalgias, neck pain and neck stiffness.  Skin: Negative for color change, pallor, rash and wound.  Neurological: Positive for headaches. Negative for dizziness.  Hematological: Does not bruise/bleed easily.  Psychiatric/Behavioral: Positive for dysphoric mood and sleep disturbance. Negative for agitation, behavioral problems, confusion, decreased concentration, hallucinations, self-injury and suicidal ideas. The patient is nervous/anxious. The patient is not hyperactive.        Objective:   Physical Exam  Constitutional: She is oriented to person, place, and time. She appears well-developed and well-nourished. No distress.  HENT:  Head: Normocephalic and atraumatic.  Right Ear: External ear normal.  Left Ear: External ear normal.  Nose: Nose normal.  Mouth/Throat: Oropharynx is clear and moist.  Cardiovascular: Normal rate, regular rhythm, normal heart sounds and intact distal pulses.  No murmur heard. Pulmonary/Chest: Effort normal and breath sounds normal. No stridor. No respiratory distress. She has no wheezes. She has no rales. She exhibits no tenderness.  Neurological: She is alert and oriented to person, place, and time.  Skin: Skin is warm and dry. Capillary refill takes less than 2 seconds. No rash noted. She is not diaphoretic. No erythema. No pallor.  Psychiatric: She has a normal mood and affect. Her speech is normal and behavior is normal. Judgment and thought content normal. She is not actively hallucinating. Cognition and memory are normal.  Well groomed Tearful at times when speaking about her mother's sudden death She is attentive.      Assessment & Plan:   No  diagnosis found.  No problem-specific Assessment & Plan notes found for this encounter.   Pt was in the office today for 50+ minutes, I spent over 50% of time in face to face counseling of various medical concerns and in coordination of care FOLLOW-UP:  No follow-ups on file.

## 2018-08-03 NOTE — Progress Notes (Signed)
Opened in error. T. Zylen Wenig, CMA 

## 2018-08-06 ENCOUNTER — Ambulatory Visit: Payer: BC Managed Care – PPO | Admitting: Adult Health

## 2018-08-07 ENCOUNTER — Encounter: Payer: Self-pay | Admitting: Adult Health

## 2018-08-07 ENCOUNTER — Ambulatory Visit (INDEPENDENT_AMBULATORY_CARE_PROVIDER_SITE_OTHER): Payer: BC Managed Care – PPO | Admitting: Adult Health

## 2018-08-07 DIAGNOSIS — F419 Anxiety disorder, unspecified: Secondary | ICD-10-CM

## 2018-08-07 DIAGNOSIS — Z0000 Encounter for general adult medical examination without abnormal findings: Secondary | ICD-10-CM

## 2018-08-07 MED ORDER — DULOXETINE HCL 60 MG PO CPEP: ORAL_CAPSULE | ORAL | 3 refills | Status: DC

## 2018-08-07 NOTE — Assessment & Plan Note (Signed)
[place] Controlled Substance Database reviewed-  Regular refills on narcotics and anxiolytics  Discussed that ultimate goal is to wean off Clonazepam  Controlled Sub Contract completed today 0.5mg  daily PRN, 30 count max per 30 day period

## 2018-08-07 NOTE — Progress Notes (Signed)
Subjective:    Patient ID: Tina Hunt, female    DOB: 07-09-1976, 42 y.o.   MRN: [id number]  HPI: 05/03/18 OV:  Tina Hunt is here to establish as a new pt.  She is a pleasant 42 year old female. PMH: Chronic anxiety/depression- currently on Cymbalta 90mg  QHS and Clonazepam 0.5mg  Q12H PRN Chronic migraine with aura- estimates 2-4 migraines/month-managed by Neurology Chronic cervical neck/lumbar back pain- she recently switched from pain clinic to Dr. Nelva Bush for injections and oral narcotics. She underwent Bariatric Surgery Jan 2019- [facility] She has lost >80 lbs She follows heart healthy diet and achieves >10K steps/day, wt training 3 times/week, and "pool walking" 3 times/week. She denies tobacco use and seldom drinks ETOH  She reports sig anxiety from current financial struggles and the sudden death of her mother 2 years ago. She attended a Grief Share session but reports "it didn't help much". She has not seen therapist >20 years She reports feeling "more down the last few weeks b/c it was the anniversary of my mother's death" She denies thoughts of harming herself/others She is married with one child- son age 67 She reports strong support system of family (her sister, husband) and local friends. 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  She estimates to use one tablet most days of the week, denies sedation. She denies ETOH use She has been on Duloxetine 90mg  QD for >1 year She has not tolerated Sertraline in past and her sister did not tolerated Wellbutrin in past She recently established with [facility]! She denies thoughts of harming herself/others She reports recently securing home loan to purchase her mother's home that is currently in foreclosure   Patient Care Team    Relationship Specialty  Notifications Start End  Mina Marble D, NP PCP - General Family Medicine  05/03/18   Marcial Pacas, MD Consulting Physician Neurology  05/03/18   Suella Broad, MD Consulting Physician Physical Medicine and Rehabilitation  05/03/18   Harriett Sine, MD Consulting Physician Dermatology  05/03/18   Everett Graff, MD Consulting Physician Obstetrics and Gynecology  05/03/18   [place], Heather, [place]  Optometry  05/03/18   Heron Nay, MD  Surgery  05/03/18   Marrion Coy, MD Referring Physician Chiropractic Medicine  05/03/18   Harlan Stains, MD Attending Physician Family Medicine  05/03/18     Patient Active Problem List   Diagnosis Date Noted  . Healthcare maintenance 05/03/2018  . Anxiety 05/03/2018  . Depression 05/03/2018  . Acute post-operative pain 03/26/2017  . Chronic migraine 12/22/2016  . Migraine 12/22/2016  . Acute medial meniscal tear 03/11/2014  . Eustachian tube dysfunction 11/28/2011     Past Medical History:  Diagnosis Date  . Anxiety   . Arthritis    "a little in Right knee"  . Chronic back pain   . Depression   . Endometriosis   . Headache(784.0)    MIGRAINES   . History of blood transfusion 01/10/2003   [place] - 3 units transfused  . Migraine   . Neck pain    clench teeth at night  . Neuromuscular disorder ([place])    BLACKOUT SPELLS  BUT NONE SINCE 1996   . Pneumonia    hx of 3 years ago  . PONV (postoperative nausea and vomiting)   . Recurrent sinusitis   . Sleep  apnea    Does not use CPAP  . 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 ADENOIDECTOMY  1995   with tube placement  . CESAREAN SECTION     2004   . CHROMOPERTUBATION Bilateral 10/09/2015   Procedure: CHROMOPERTUBATION;  Surgeon: Everett Graff, MD;  Location: [facility];  Service: Gynecology;  Laterality: Bilateral;  . CYSTOSCOPY Bilateral 03/20/2017   Procedure: CYSTOSCOPY;  Surgeon: Everett Graff, MD;  Location: [facility];  Service: Gynecology;  Laterality:  Bilateral;  . DILITATION & CURRETTAGE/HYSTROSCOPY WITH NOVASURE ABLATION N/A 10/09/2015   Procedure: DILATATION & CURETTAGE/HYSTEROSCOPY;  Surgeon: Everett Graff, MD;  Location: [facility];  Service: Gynecology;  Laterality: N/A;  . KNEE ARTHROSCOPY Left    1988   . KNEE ARTHROSCOPY Left 03/12/2014   Procedure: LEFT ARTHROSCOPY KNEE WITH DEBRIDEMENT;  Surgeon: Gearlean Alf, MD;  Location: [facility];  Service: Orthopedics;  Laterality: Left;  medical and lateral repair menius  . LAPAROSCOPIC OVARIAN CYSTECTOMY Right 10/09/2015   Procedure: LAPAROSCOPIC RIGHT OVARIAN CYSTECTOMIES, LEFT PARATUBAL CYSTECTOMY ;  Surgeon: Everett Graff, MD;  Location: [facility];  Service: Gynecology;  Laterality: Right;  . SLEEVE GASTROPLASTY    . TONSILLECTOMY  1983, 1985   ADENOID X2 (SEPARATE SURGERY)  TUBE PLACEMENT  . TOTAL LAPAROSCOPIC HYSTERECTOMY WITH SALPINGECTOMY Bilateral 03/20/2017   Procedure: HYSTERECTOMY TOTAL LAPAROSCOPIC WITH BILATERAL SALPINGECTOMY;  Surgeon: Everett Graff, MD;  Location: [facility];  Service: Gynecology;  Laterality: Bilateral;  . TYMPANOSTOMY [place], 2002, 2006, 2009, 2013     Family History  Problem Relation Age of Onset  . Diabetes Father   . Heart attack Father   . CAD Father   . Alcohol abuse Father   . Stroke Mother   . Diabetes Paternal Grandfather   . Heart attack Paternal Grandfather   . Hypertension Paternal Grandfather   . Alcohol abuse Paternal Grandfather   . Lung cancer Maternal Grandfather      Social History   Substance and Sexual Activity  Drug Use No     Social History   Substance and Sexual Activity  Alcohol Use Yes  . Alcohol/week: 1.0 standard drinks  . Types: 1 Glasses of wine per week     Social History   Tobacco Use  Smoking Status Never Smoker  Smokeless Tobacco Never Used     Outpatient Encounter Medications as of 08/07/2018  Medication Sig Note  . clonazePAM (KLONOPIN) 0.5 MG tablet Once daily as needed  for acute anxiety   . diphenhydrAMINE (BENADRYL) 25 mg capsule Take 25 mg by mouth every 6 (six) hours as needed for allergies.   . methocarbamol (ROBAXIN) 500 MG tablet Take 1 tablet (500 mg total) by mouth 4 (four) times daily. (Patient taking differently: Take 500 mg by mouth at bedtime as needed for muscle spasms. ) 03/09/2017: Rotates with flexeril   . Multiple Vitamin (MULTIVITAMIN) tablet Take 1 tablet by mouth daily.   Marland Kitchen omeprazole (PRILOSEC) 20 MG capsule Take 20 mg by mouth 2 (two) times daily before a meal.    . ondansetron (ZOFRAN-ODT) 8 MG disintegrating tablet Take 8 mg by mouth every 8 (eight) hours as needed. For GI issues.   Marland Kitchen oxyCODONE-acetaminophen (ROXICET) 5-325 MG tablet 1-2  tablets po every 6  hours prn-moderate pain   . promethazine (PHENERGAN) 25 MG tablet Take 1 tablet (25 mg total) by mouth every 6 (six) hours  as needed for nausea or vomiting. Must last 30 days.   . propranolol (INDERAL) 60 MG tablet Take 1 tablet (60 mg total) by mouth daily.   . rizatriptan (MAXALT-MLT) 10 MG disintegrating tablet DISSOLVE 1 TABLET ON TONGUE AS NEEDED FOR HEADACHE, MAY REPEAT IN 2 HOURS IF NEEDED.no more than 2 in 24 hours   . SUMAtriptan (IMITREX STATDOSE SYSTEM) 6 MG/0.5ML SOAJ Inject 0.5 mLs into the skin as needed (use if maxalt fails).   . thiamine (VITAMIN B-1) 100 MG tablet Take 1 tablet by mouth daily.   Marland Kitchen topiramate (TOPAMAX) 100 MG tablet Take 1 tablet (100 mg total) by mouth 2 (two) times daily.   . [DISCONTINUED] DULoxetine (CYMBALTA) 30 MG capsule TAKE 3 CAPSULES ONCE DAILY.   . DULoxetine (CYMBALTA) 60 MG capsule 2 tabs once daily   . ursodiol (ACTIGALL) 300 MG capsule Take 300 mg by mouth 2 (two) times daily.    . [DISCONTINUED] calcium carbonate (TUMS - DOSED IN MG ELEMENTAL CALCIUM) 500 MG chewable tablet Chew 1 tablet by mouth daily.   . [DISCONTINUED] cyclobenzaprine (FLEXERIL) 10 MG tablet Take 10 mg by mouth at bedtime as needed for muscle spasms. 09/19/2016:  Alternates with methocarbamol.    No facility-administered encounter medications on file as of 08/07/2018.     Allergies: Tape; Aspirin; Biaxin [clarithromycin]; Nsaids; and Penicillins  Body mass index is 52.38 kg/m.  Blood pressure 139/88, pulse 60, height 5\' 1"  (1.549 m), weight 277 lb 3.2 oz (125.7 kg), SpO2 99 %.  Review of Systems  Constitutional: Positive for fatigue. Negative for activity change, appetite change, chills, diaphoresis, fever and unexpected weight change.  HENT: Negative for congestion.   Eyes: Negative for visual disturbance.  Respiratory: Negative for cough, chest tightness, shortness of breath, wheezing and stridor.   Cardiovascular: Negative for chest pain, palpitations and leg swelling.  Gastrointestinal: Negative for abdominal distention, abdominal pain, blood in stool, constipation, diarrhea, nausea and vomiting.  Genitourinary: Negative for difficulty urinating and flank pain.  Musculoskeletal: Positive for arthralgias, back pain, gait problem, joint swelling, myalgias, neck pain and neck stiffness.  Skin: Negative for color change, pallor, rash and wound.  Neurological: Positive for headaches. Negative for dizziness.  Hematological: Does not bruise/bleed easily.  Psychiatric/Behavioral: Positive for dysphoric mood and sleep disturbance. Negative for agitation, behavioral problems, confusion, decreased concentration, hallucinations, self-injury and suicidal ideas. The patient is nervous/anxious. The patient is not hyperactive.        Objective:   Physical Exam  Constitutional: She is oriented to person, place, and time. She appears well-developed and well-nourished. No distress.  HENT:  Head: Normocephalic and atraumatic.  Right Ear: External ear normal.  Left Ear: External ear normal.  Nose: Nose normal.  Mouth/Throat: Oropharynx is clear and moist.  Cardiovascular: Normal rate, regular rhythm, normal heart sounds and intact distal pulses.  No  murmur heard. Pulmonary/Chest: Effort normal and breath sounds normal. No stridor. No respiratory distress. She has no wheezes. She has no rales. She exhibits no tenderness.  Neurological: She is alert and oriented to person, place, and time.  Skin: Skin is warm and dry. Capillary refill takes less than 2 seconds. No rash noted. She is not diaphoretic. No erythema. No pallor.  Psychiatric: She has a normal mood and affect. Her speech is normal and behavior is normal. Judgment and thought content normal. She is not actively hallucinating. Cognition and memory are normal.  Well groomed Tearful at times when speaking about her mother's sudden death that occurred >  2 years ago She is attentive.      Assessment & Plan:   1. Healthcare maintenance   2. Anxiety     Healthcare maintenance  Increase Duloxetine (Cymbalta) to 60mg , two tablets once daily. Completed Controlled Substance Contract, ultimate plan is to wean off Clonazepam. Continue with [facility]! Increase water and follow Heart Healthy Diet. Increase regular exercise as tolerated. Good luck with home loan. Follow-up next month with compete physical.  [place] Controlled Substance Database reviewed-  Regular refills on narcotics and anxiolytics  Discussed that ultimate goal is to wean off Clonazepam  Controlled Sub Contract completed today 0.5mg  daily PRN, 30 count max per 30 day period   Pt was in the office today for 35+ minutes, I spent over 50% of time in face to face counseling of various medical concerns and in coordination of care FOLLOW-UP:  Return in about 4 weeks (around 09/04/2018) for CPE.

## 2018-08-07 NOTE — Assessment & Plan Note (Signed)
  Increase Duloxetine (Cymbalta) to 60mg , two tablets once daily. Completed Controlled Substance Contract, ultimate plan is to wean off Clonazepam. Continue with [facility]! Increase water and follow Heart Healthy Diet. Increase regular exercise as tolerated. Good luck with home loan. Follow-up next month with compete physical.

## 2018-08-07 NOTE — Patient Instructions (Addendum)
Generalized Anxiety Disorder, Adult Generalized anxiety disorder (GAD) is a mental health disorder. People with this condition constantly worry about everyday events. Unlike normal anxiety, worry related to GAD is not triggered by a specific event. These worries also do not fade or get better with time. GAD interferes with life functions, including relationships, work, and school. GAD can vary from mild to severe. People with severe GAD can have intense waves of anxiety with physical symptoms (panic attacks). What are the causes? The exact cause of GAD is not known. What increases the risk? This condition is more likely to develop in:  Women.  People who have a family history of anxiety disorders.  People who are very shy.  People who experience very stressful life events, such as the death of a loved one.  People who have a very stressful family environment.  What are the signs or symptoms? People with GAD often worry excessively about many things in their lives, such as their health and family. They may also be overly concerned about:  Doing well at work.  Being on time.  Natural disasters.  Friendships.  Physical symptoms of GAD include:  Fatigue.  Muscle tension or having muscle twitches.  Trembling or feeling shaky.  Being easily startled.  Feeling like your heart is pounding or racing.  Feeling out of breath or like you cannot take a deep breath.  Having trouble falling asleep or staying asleep.  Sweating.  Nausea, diarrhea, or irritable bowel syndrome (IBS).  Headaches.  Trouble concentrating or remembering facts.  Restlessness.  Irritability.  How is this diagnosed? Your health care provider can diagnose GAD based on your symptoms and medical history. You will also have a physical exam. The health care provider will ask specific questions about your symptoms, including how severe they are, when they started, and if they come and go. Your health care  provider may ask you about your use of alcohol or drugs, including prescription medicines. Your health care provider may refer you to a mental health specialist for further evaluation. Your health care provider will do a thorough examination and may perform additional tests to rule out other possible causes of your symptoms. To be diagnosed with GAD, a person must have anxiety that:  Is out of his or her control.  Affects several different aspects of his or her life, such as work and relationships.  Causes distress that makes him or her unable to take part in normal activities.  Includes at least three physical symptoms of GAD, such as restlessness, fatigue, trouble concentrating, irritability, muscle tension, or sleep problems.  Before your health care provider can confirm a diagnosis of GAD, these symptoms must be present more days than they are not, and they must last for six months or longer. How is this treated? The following therapies are usually used to treat GAD:  Medicine. Antidepressant medicine is usually prescribed for long-term daily control. Antianxiety medicines may be added in severe cases, especially when panic attacks occur.  Talk therapy (psychotherapy). Certain types of talk therapy can be helpful in treating GAD by providing support, education, and guidance. Options include: ? Cognitive behavioral therapy (CBT). People learn coping skills and techniques to ease their anxiety. They learn to identify unrealistic or negative thoughts and behaviors and to replace them with positive ones. ? Acceptance and commitment therapy (ACT). This treatment teaches people how to be mindful as a way to cope with unwanted thoughts and feelings. ? Biofeedback. This process trains you to   manage your body's response (physiological response) through breathing techniques and relaxation methods. You will work with a therapist while machines are used to monitor your physical symptoms.  Stress  management techniques. These include yoga, meditation, and exercise.  A mental health specialist can help determine which treatment is best for you. Some people see improvement with one type of therapy. However, other people require a combination of therapies. Follow these instructions at home:  Take over-the-counter and prescription medicines only as told by your health care provider.  Try to maintain a normal routine.  Try to anticipate stressful situations and allow extra time to manage them.  Practice any stress management or self-calming techniques as taught by your health care provider.  Do not punish yourself for setbacks or for not making progress.  Try to recognize your accomplishments, even if they are small.  Keep all follow-up visits as told by your health care provider. This is important. Contact a health care provider if:  Your symptoms do not get better.  Your symptoms get worse.  You have signs of depression, such as: ? A persistently sad, cranky, or irritable mood. ? Loss of enjoyment in activities that used to bring you joy. ? Change in weight or eating. ? Changes in sleeping habits. ? Avoiding friends or family members. ? Loss of energy for normal tasks. ? Feelings of guilt or worthlessness. Get help right away if:  You have serious thoughts about hurting yourself or others. If you ever feel like you may hurt yourself or others, or have thoughts about taking your own life, get help right away. You can go to your nearest emergency department or call:  Your local emergency services (911 in the [place]  A suicide crisis helpline, such as the [place] at (704) 421-5720. This is open 24 hours a day.  Summary  Generalized anxiety disorder (GAD) is a mental health disorder that involves worry that is not triggered by a specific event.  People with GAD often worry excessively about many things in their lives, such as their health and  family.  GAD may cause physical symptoms such as restlessness, trouble concentrating, sleep problems, frequent sweating, nausea, diarrhea, headaches, and trembling or muscle twitching.  A mental health specialist can help determine which treatment is best for you. Some people see improvement with one type of therapy. However, other people require a combination of therapies. This information is not intended to replace advice given to you by your health care provider. Make sure you discuss any questions you have with your health care provider. Document Released: 01/07/2013 Document Revised: 08/02/2016 Document Reviewed: 08/02/2016 Elsevier Interactive Patient Education  2018 [place].  Increase Duloxetine (Cymbalta) to 60mg , two tablets once daily. Completed Controlled Substance Contract, ultimate plan is to wean off Clonazepam. Continue with Intergrative Therapist! Increase water and follow Heart Healthy Diet. Increase regular exercise as tolerated. Good luck with home loan. Follow-up next month with compete physical. NICE TO SEE YOU!

## 2018-08-22 ENCOUNTER — Encounter: Payer: BC Managed Care – PPO | Admitting: Adult Health

## 2018-08-27 ENCOUNTER — Other Ambulatory Visit: Payer: Self-pay | Admitting: Adult Health

## 2018-08-27 NOTE — Telephone Encounter (Signed)
[place] Controlled Substance database reviewed.  No aberrancies noted.  Signed benzo contract on file.  Charyl Bigger, CMA

## 2018-09-11 ENCOUNTER — Other Ambulatory Visit (HOSPITAL_COMMUNITY): Payer: Self-pay | Admitting: Orthopedic Surgery

## 2018-09-11 ENCOUNTER — Other Ambulatory Visit: Payer: Self-pay

## 2018-09-11 ENCOUNTER — Encounter (HOSPITAL_BASED_OUTPATIENT_CLINIC_OR_DEPARTMENT_OTHER): Payer: Self-pay | Admitting: *Deleted

## 2018-09-13 ENCOUNTER — Encounter (HOSPITAL_BASED_OUTPATIENT_CLINIC_OR_DEPARTMENT_OTHER)
Admission: RE | Admit: 2018-09-13 | Discharge: 2018-09-13 | Disposition: A | Discharge status: Home / Self Care | Payer: BC Managed Care – PPO | Source: Ambulatory Visit | Attending: Orthopedic Surgery | Admitting: Orthopedic Surgery

## 2018-09-13 ENCOUNTER — Encounter: Payer: Self-pay | Admitting: Nurse Practitioner

## 2018-09-13 NOTE — Anesthesia Preprocedure Evaluation (Addendum)
Anesthesia Evaluation  Patient identified by MRN, date of birth, ID band Patient awake    Reviewed: Allergy & Precautions, NPO status , Patient's Chart, lab work & pertinent test results, reviewed documented beta blocker date and time   History of Anesthesia Complications (+) PONV and history of anesthetic complications  Airway Mallampati: II  TM Distance: >3 FB Neck ROM: Full    Dental  (+) Teeth Intact, Dental Advisory Given   Pulmonary sleep apnea ,    Pulmonary exam normal breath sounds clear to auscultation       Cardiovascular negative cardio ROS Normal cardiovascular exam Rhythm:Regular Rate:Normal     Neuro/Psych  Headaches, PSYCHIATRIC DISORDERS Anxiety Depression    GI/Hepatic Neg liver ROS, GERD  Medicated,  Endo/Other  Morbid obesity  Renal/GU negative Renal ROS     Musculoskeletal  (+) Arthritis , right foot hallux varus, sprain of metatarsophalangeal joint of the right great toe   Abdominal   Peds  Hematology negative hematology ROS (+)   Anesthesia Other Findings Airway check 09/13/2018: OK to proceed.  Reproductive/Obstetrics                            Anesthesia Physical Anesthesia Plan  ASA: III  Anesthesia Plan: General   Post-op Pain Management:  Regional for Post-op pain   Induction: Intravenous  PONV Risk Score and Plan: 4 or greater and Scopolamine patch - Pre-op, Midazolam, Dexamethasone, Ondansetron and Diphenhydramine  Airway Management Planned: LMA  Additional Equipment:   Intra-op Plan:   Post-operative Plan: Extubation in OR  Informed Consent: I have reviewed the patients History and Physical, chart, labs and discussed the procedure including the risks, benefits and alternatives for the proposed anesthesia with the patient or authorized representative who has indicated his/her understanding and acceptance.   Dental advisory given  Plan Discussed  with: CRNA  Anesthesia Plan Comments:         Anesthesia Quick Evaluation

## 2018-09-14 ENCOUNTER — Encounter (HOSPITAL_BASED_OUTPATIENT_CLINIC_OR_DEPARTMENT_OTHER)
Admission: RE | Disposition: A | Discharge status: Home / Self Care | Payer: Self-pay | Source: Home / Self Care | Attending: Orthopedic Surgery

## 2018-09-14 ENCOUNTER — Ambulatory Visit (HOSPITAL_BASED_OUTPATIENT_CLINIC_OR_DEPARTMENT_OTHER): Payer: BC Managed Care – PPO | Admitting: Certified Registered"

## 2018-09-14 ENCOUNTER — Ambulatory Visit (HOSPITAL_BASED_OUTPATIENT_CLINIC_OR_DEPARTMENT_OTHER)
Admission: RE | Admit: 2018-09-14 | Discharge: 2018-09-14 | Disposition: A | Discharge status: Home / Self Care | Payer: BC Managed Care – PPO | Attending: Orthopedic Surgery | Admitting: Orthopedic Surgery

## 2018-09-14 ENCOUNTER — Encounter (HOSPITAL_BASED_OUTPATIENT_CLINIC_OR_DEPARTMENT_OTHER): Payer: Self-pay

## 2018-09-14 ENCOUNTER — Other Ambulatory Visit: Payer: Self-pay

## 2018-09-14 DIAGNOSIS — G4730 Sleep apnea, unspecified: Secondary | ICD-10-CM | POA: Insufficient documentation

## 2018-09-14 DIAGNOSIS — Z6841 Body Mass Index (BMI) 40.0 and over, adult: Secondary | ICD-10-CM | POA: Insufficient documentation

## 2018-09-14 DIAGNOSIS — F329 Major depressive disorder, single episode, unspecified: Secondary | ICD-10-CM | POA: Insufficient documentation

## 2018-09-14 DIAGNOSIS — M1711 Unilateral primary osteoarthritis, right knee: Secondary | ICD-10-CM | POA: Insufficient documentation

## 2018-09-14 DIAGNOSIS — Z79899 Other long term (current) drug therapy: Secondary | ICD-10-CM | POA: Diagnosis not present

## 2018-09-14 DIAGNOSIS — K219 Gastro-esophageal reflux disease without esophagitis: Secondary | ICD-10-CM | POA: Insufficient documentation

## 2018-09-14 DIAGNOSIS — F419 Anxiety disorder, unspecified: Secondary | ICD-10-CM | POA: Insufficient documentation

## 2018-09-14 DIAGNOSIS — S93524A Sprain of metatarsophalangeal joint of right lesser toe(s), initial encounter: Secondary | ICD-10-CM | POA: Diagnosis not present

## 2018-09-14 DIAGNOSIS — S93521A Sprain of metatarsophalangeal joint of right great toe, initial encounter: Secondary | ICD-10-CM | POA: Diagnosis not present

## 2018-09-14 DIAGNOSIS — G8929 Other chronic pain: Secondary | ICD-10-CM | POA: Insufficient documentation

## 2018-09-14 DIAGNOSIS — M2031 Hallux varus (acquired), right foot: Secondary | ICD-10-CM | POA: Insufficient documentation

## 2018-09-14 DIAGNOSIS — G43909 Migraine, unspecified, not intractable, without status migrainosus: Secondary | ICD-10-CM | POA: Diagnosis not present

## 2018-09-14 HISTORY — DX: Hallux varus (acquired), right foot: M20.31

## 2018-09-14 HISTORY — PX: LIGAMENT REPAIR: SHX5444

## 2018-09-14 SURGERY — REPAIR, LIGAMENT
Anesthesia: General | Site: Foot | Laterality: Right

## 2018-09-14 MED ORDER — PROPOFOL 10 MG/ML IV BOLUS
INTRAVENOUS | Status: DC | PRN
  Administered 2018-09-14: 200 mg via INTRAVENOUS

## 2018-09-14 MED ORDER — CLONIDINE HCL (ANALGESIA) 100 MCG/ML EP SOLN
EPIDURAL | Status: DC | PRN
  Administered 2018-09-14: 100 ug

## 2018-09-14 MED ORDER — ROPIVACAINE HCL 5 MG/ML IJ SOLN
INTRAMUSCULAR | Status: DC | PRN
  Administered 2018-09-14: 10 mL via PERINEURAL
  Administered 2018-09-14: 30 mL via PERINEURAL

## 2018-09-14 MED ORDER — ONDANSETRON HCL 4 MG/2ML IJ SOLN
4.0000 mg | Freq: Once | INTRAMUSCULAR | Status: AC | PRN
  Administered 2018-09-14: 4 mg via INTRAVENOUS

## 2018-09-14 MED ORDER — MIDAZOLAM HCL 2 MG/2ML IJ SOLN
INTRAMUSCULAR | Status: AC
  Filled 2018-09-14: qty 2

## 2018-09-14 MED ORDER — FENTANYL CITRATE (PF) 100 MCG/2ML IJ SOLN
50.0000 ug | INTRAMUSCULAR | Status: DC | PRN
  Administered 2018-09-14: 50 ug via INTRAVENOUS

## 2018-09-14 MED ORDER — CEFAZOLIN SODIUM-DEXTROSE 2-4 GM/100ML-% IV SOLN: 2.0000 g | INTRAVENOUS | Status: DC

## 2018-09-14 MED ORDER — LIDOCAINE 2% (20 MG/ML) 5 ML SYRINGE
INTRAMUSCULAR | Status: DC | PRN
  Administered 2018-09-14: 80 mg via INTRAVENOUS

## 2018-09-14 MED ORDER — PROPOFOL 10 MG/ML IV BOLUS
INTRAVENOUS | Status: AC
  Filled 2018-09-14: qty 20

## 2018-09-14 MED ORDER — OXYCODONE HCL 5 MG PO TABS: 5.0000 mg | ORAL_TABLET | Freq: Three times a day (TID) | ORAL | 0 refills | Status: AC | PRN

## 2018-09-14 MED ORDER — ONDANSETRON HCL 4 MG/2ML IJ SOLN
INTRAMUSCULAR | Status: DC | PRN
  Administered 2018-09-14: 4 mg via INTRAVENOUS

## 2018-09-14 MED ORDER — LACTATED RINGERS IV SOLN
INTRAVENOUS | Status: DC
  Administered 2018-09-14 (×2): via INTRAVENOUS

## 2018-09-14 MED ORDER — SCOPOLAMINE 1 MG/3DAYS TD PT72: 1.0000 | MEDICATED_PATCH | Freq: Once | TRANSDERMAL | Status: DC | PRN

## 2018-09-14 MED ORDER — CEFAZOLIN SODIUM-DEXTROSE 2-4 GM/100ML-% IV SOLN
INTRAVENOUS | Status: AC
  Filled 2018-09-14: qty 100

## 2018-09-14 MED ORDER — MEPERIDINE HCL 25 MG/ML IJ SOLN: 6.2500 mg | INTRAMUSCULAR | Status: DC | PRN

## 2018-09-14 MED ORDER — SODIUM CHLORIDE 0.9 % IV SOLN: INTRAVENOUS | Status: DC

## 2018-09-14 MED ORDER — DEXAMETHASONE SODIUM PHOSPHATE 10 MG/ML IJ SOLN
INTRAMUSCULAR | Status: AC
  Filled 2018-09-14: qty 1

## 2018-09-14 MED ORDER — ONDANSETRON HCL 4 MG/2ML IJ SOLN
INTRAMUSCULAR | Status: AC
  Filled 2018-09-14: qty 2

## 2018-09-14 MED ORDER — FENTANYL CITRATE (PF) 100 MCG/2ML IJ SOLN: 25.0000 ug | INTRAMUSCULAR | Status: DC | PRN

## 2018-09-14 MED ORDER — DEXAMETHASONE SODIUM PHOSPHATE 10 MG/ML IJ SOLN
INTRAMUSCULAR | Status: DC | PRN
  Administered 2018-09-14: 10 mg via INTRAVENOUS

## 2018-09-14 MED ORDER — FENTANYL CITRATE (PF) 100 MCG/2ML IJ SOLN
INTRAMUSCULAR | Status: AC
  Filled 2018-09-14: qty 2

## 2018-09-14 MED ORDER — MIDAZOLAM HCL 2 MG/2ML IJ SOLN
1.0000 mg | INTRAMUSCULAR | Status: DC | PRN
  Administered 2018-09-14: 2 mg via INTRAVENOUS

## 2018-09-14 MED ORDER — CHLORHEXIDINE GLUCONATE 4 % EX LIQD: 60.0000 mL | Freq: Once | CUTANEOUS | Status: DC

## 2018-09-14 MED ORDER — LIDOCAINE 2% (20 MG/ML) 5 ML SYRINGE
INTRAMUSCULAR | Status: AC
  Filled 2018-09-14: qty 5

## 2018-09-14 SURGICAL SUPPLY — 73 items
ANCH SUT 2-0 MN NDL DRL PLSTR (Anchor) ×2 IMPLANT
ANCHOR JUGGERKNOT 1.0 1DR 2-0 (Anchor) ×3 IMPLANT
BANDAGE ACE 4X5 VEL STRL LF (GAUZE/BANDAGES/DRESSINGS) ×3 IMPLANT
BANDAGE ESMARK 6X9 LF (GAUZE/BANDAGES/DRESSINGS) IMPLANT
BLADE AVERAGE 25MMX9MM (BLADE)
BLADE AVERAGE 25X9 (BLADE) IMPLANT
BLADE MICRO SAGITTAL (BLADE) IMPLANT
BLADE OSC/SAG .038X5.5 CUT EDG (BLADE) IMPLANT
BLADE SURG 15 STRL LF DISP TIS (BLADE) ×4 IMPLANT
BLADE SURG 15 STRL SS (BLADE) ×8
BNDG CMPR 9X4 STRL LF SNTH (GAUZE/BANDAGES/DRESSINGS) ×2
BNDG CMPR 9X6 STRL LF SNTH (GAUZE/BANDAGES/DRESSINGS)
BNDG COHESIVE 4X5 TAN STRL (GAUZE/BANDAGES/DRESSINGS) IMPLANT
BNDG COHESIVE 6X5 TAN STRL LF (GAUZE/BANDAGES/DRESSINGS) IMPLANT
BNDG CONFORM 3 STRL LF (GAUZE/BANDAGES/DRESSINGS) ×4 IMPLANT
BNDG ESMARK 4X9 LF (GAUZE/BANDAGES/DRESSINGS) ×3 IMPLANT
BNDG ESMARK 6X9 LF (GAUZE/BANDAGES/DRESSINGS)
BOOT STEPPER DURA LG (SOFTGOODS) IMPLANT
BOOT STEPPER DURA MED (SOFTGOODS) IMPLANT
BOOT STEPPER DURA SM (SOFTGOODS) IMPLANT
BOOT STEPPER DURA XLG (SOFTGOODS) IMPLANT
CHLORAPREP W/TINT 26ML (MISCELLANEOUS) ×4 IMPLANT
COVER BACK TABLE 60X90IN (DRAPES) ×4 IMPLANT
COVER WAND RF STERILE (DRAPES) IMPLANT
CUFF TOURN SGL LL 18 NRW (TOURNIQUET CUFF) ×3 IMPLANT
CUFF TOURNIQUET SINGLE 34IN LL (TOURNIQUET CUFF) IMPLANT
DRAPE EXTREMITY T 121X128X90 (DRAPE) ×4 IMPLANT
DRAPE OEC MINIVIEW 54X84 (DRAPES) ×4 IMPLANT
DRAPE U-SHAPE 47X51 STRL (DRAPES) ×4 IMPLANT
DRSG MEPITEL 4X7.2 (GAUZE/BANDAGES/DRESSINGS) ×4 IMPLANT
DRSG PAD ABDOMINAL 8X10 ST (GAUZE/BANDAGES/DRESSINGS) ×8 IMPLANT
ELECT REM PT RETURN 9FT ADLT (ELECTROSURGICAL) ×4
ELECTRODE REM PT RTRN 9FT ADLT (ELECTROSURGICAL) ×2 IMPLANT
GAUZE SPONGE 4X4 12PLY STRL (GAUZE/BANDAGES/DRESSINGS) ×4 IMPLANT
GLOVE BIO SURGEON STRL SZ8 (GLOVE) ×4 IMPLANT
GLOVE BIOGEL PI IND STRL 8 (GLOVE) ×3 IMPLANT
GLOVE BIOGEL PI INDICATOR 8 (GLOVE) ×2
GLOVE ECLIPSE 8.0 STRL XLNG CF (GLOVE) ×4 IMPLANT
GOWN STRL REUS W/ TWL LRG LVL3 (GOWN DISPOSABLE) ×2 IMPLANT
GOWN STRL REUS W/ TWL XL LVL3 (GOWN DISPOSABLE) ×3 IMPLANT
GOWN STRL REUS W/TWL LRG LVL3 (GOWN DISPOSABLE) ×4
GOWN STRL REUS W/TWL XL LVL3 (GOWN DISPOSABLE) ×4
NEEDLE HYPO 22GX1.5 SAFETY (NEEDLE) IMPLANT
PACK BASIN DAY SURGERY FS (CUSTOM PROCEDURE TRAY) ×4 IMPLANT
PAD CAST 4YDX4 CTTN HI CHSV (CAST SUPPLIES) ×2 IMPLANT
PADDING CAST ABS 4INX4YD NS (CAST SUPPLIES)
PADDING CAST ABS COTTON 4X4 ST (CAST SUPPLIES) IMPLANT
PADDING CAST COTTON 4X4 STRL (CAST SUPPLIES) ×4
PADDING CAST COTTON 6X4 STRL (CAST SUPPLIES) IMPLANT
PENCIL BUTTON HOLSTER BLD 10FT (ELECTRODE) ×4 IMPLANT
SANITIZER HAND PURELL 535ML FO (MISCELLANEOUS) ×4 IMPLANT
SHEET MEDIUM DRAPE 40X70 STRL (DRAPES) ×4 IMPLANT
SLEEVE SCD COMPRESS KNEE MED (MISCELLANEOUS) ×4 IMPLANT
SPLINT FAST PLASTER 5X30 (CAST SUPPLIES)
SPLINT PLASTER CAST FAST 5X30 (CAST SUPPLIES) IMPLANT
SPONGE LAP 18X18 RF (DISPOSABLE) ×4 IMPLANT
SPONGE SURGIFOAM ABS GEL 12-7 (HEMOSTASIS) IMPLANT
STOCKINETTE 6  STRL (DRAPES) ×2
STOCKINETTE 6 STRL (DRAPES) ×2 IMPLANT
SUCTION FRAZIER HANDLE 10FR (MISCELLANEOUS) ×2
SUCTION TUBE FRAZIER 10FR DISP (MISCELLANEOUS) ×2 IMPLANT
SUT ETHILON 3 0 PS 1 (SUTURE) ×4 IMPLANT
SUT MNCRL AB 3-0 PS2 18 (SUTURE) ×4 IMPLANT
SUT VIC AB 0 SH 27 (SUTURE) ×3 IMPLANT
SUT VIC AB 2-0 SH 27 (SUTURE) ×4
SUT VIC AB 2-0 SH 27XBRD (SUTURE) ×2 IMPLANT
SUT VICRYL 0 UR6 27IN ABS (SUTURE) ×3 IMPLANT
SYR BULB 3OZ (MISCELLANEOUS) ×4 IMPLANT
SYR CONTROL 10ML LL (SYRINGE) IMPLANT
TOWEL GREEN STERILE FF (TOWEL DISPOSABLE) ×8 IMPLANT
TUBE CONNECTING 20'X1/4 (TUBING) ×1
TUBE CONNECTING 20X1/4 (TUBING) ×3 IMPLANT
UNDERPAD 30X30 (UNDERPADS AND DIAPERS) ×4 IMPLANT

## 2018-09-14 NOTE — Addendum Note (Signed)
Addendum  created 09/14/18 1458 by Josephine Igo, MD   Order list changed, Order sets accessed
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
## 2018-09-14 NOTE — Anesthesia Procedure Notes (Addendum)
Anesthesia Regional Block: Adductor canal block   Pre-Anesthetic Checklist: ,, timeout performed, Correct Patient, Correct Site, Correct Laterality, Correct Procedure, Correct Position, site marked, Risks and benefits discussed,  Surgical consent,  Pre-op evaluation,  At surgeon's request and post-op pain management  Laterality: Lower and Right  Prep: chloraprep       Needles:  Injection technique: Single-shot  Needle Type: Echogenic Stimulator Needle     Needle Length: 9cm  Needle Gauge: 21     Additional Needles:   Procedures:,,,, ultrasound used (permanent image in chart),,,,  Narrative:  Start time: 09/14/2018 11:36 AM End time: 09/14/2018 11:41 AM Injection made incrementally with aspirations every 5 mL.  Performed by: Personally  Anesthesiologist: Catalina Gravel, MD  Additional Notes: No pain on injection. No increased resistance to injection. Injection made in 5cc increments.  Good needle visualization.  Patient tolerated procedure well.

## 2018-09-14 NOTE — Progress Notes (Signed)
Assisted Dr. Turk with right, ultrasound guided, popliteal/saphenous block. Side rails up, monitors on throughout procedure. See vital signs in flow sheet. Tolerated Procedure well. 

## 2018-09-14 NOTE — Discharge Instructions (Addendum)
Post Anesthesia Home Care Instructions  Activity: Get plenty of rest for the remainder of the day. A responsible individual must stay with you for 24 hours following the procedure.  For the next 24 hours, DO NOT: -Drive a car -Paediatric nurse -Drink alcoholic beverages -Take any medication unless instructed by your physician -Make any legal decisions or sign important papers.  Meals: Start with liquid foods such as gelatin or soup. Progress to regular foods as tolerated. Avoid greasy, spicy, heavy foods. If nausea and/or vomiting occur, drink only clear liquids until the nausea and/or vomiting subsides. Call your physician if vomiting continues.  Special Instructions/Symptoms: Your throat may feel dry or sore from the anesthesia or the breathing tube placed in your throat during surgery. If this causes discomfort, gargle with warm salt water. The discomfort should disappear within 24 hours.  If you had a scopolamine patch placed behind your ear for the management of post- operative nausea and/or vomiting:  1. The medication in the patch is effective for 72 hours, after which it should be removed.  Wrap patch in a tissue and discard in the trash. Wash hands thoroughly with soap and water. 2. You may remove the patch earlier than 72 hours if you experience unpleasant side effects which may include dry mouth, dizziness or visual disturbances. 3. Avoid touching the patch. Wash your hands with soap and water after contact with the patch.   Regional Anesthesia Blocks  1. Numbness or the inability to move the "blocked" extremity may last from 3-48 hours after placement. The length of time depends on the medication injected and your individual response to the medication. If the numbness is not going away after 48 hours, call your surgeon.  2. The extremity that is blocked will need to be protected until the numbness is gone and the  Strength has returned. Because you cannot feel it, you will  need to take extra care to avoid injury. Because it may be weak, you may have difficulty moving it or using it. You may not know what position it is in without looking at it while the block is in effect.  3. For blocks in the legs and feet, returning to weight bearing and walking needs to be done carefully. You will need to wait until the numbness is entirely gone and the strength has returned. You should be able to move your leg and foot normally before you try and bear weight or walk. You will need someone to be with you when you first try to ensure you do not fall and possibly risk injury.  4. Bruising and tenderness at the needle site are common side effects and will resolve in a few days.  5. Persistent numbness or new problems with movement should be communicated to the surgeon or the [facility] [phone] [place] [phone]).Wylene Simmer, MD [facility] Please read the following information regarding your care after surgery.  Medications  You only need a prescription for the narcotic pain medicine (ex. oxycodone, Percocet, Norco).  All of the other medicines listed below are available over the counter. X Aleve 2 pills twice a day for the first 3 days after surgery. X acetominophen (Tylenol) 650 mg every 4-6 hours as you need for minor to moderate pain X oxycodone as prescribed for severe pain  Narcotic pain medicine (ex. oxycodone, Percocet, Vicodin) will cause constipation.  To prevent this problem, take the following medicines while you are taking any pain medicine. X docusate  sodium (Colace) 100 mg twice a day X senna (Senokot) 2 tablets twice a day  Weight Bearing ? Bear weight when you are able on your operated leg or foot. X Bear weight only on your operated foot in the post-op shoe. ? Do not bear any weight on the operated leg or foot.  Cast / Splint / Dressing ? Keep your splint, cast or dressing clean and dry.  Dont put anything (coat  hanger, pencil, etc) down inside of it.  If it gets damp, use a hair dryer on the cool setting to dry it.  If it gets soaked, call the office to schedule an appointment for a cast change. ? Remove your dressing 3 days after surgery and cover the incisions with dry dressings.    After your dressing, cast or splint is removed; you may shower, but do not soak or scrub the wound.  Allow the water to run over it, and then gently pat it dry.  Swelling It is normal for you to have swelling where you had surgery.  To reduce swelling and pain, keep your toes above your nose for at least 3 days after surgery.  It may be necessary to keep your foot or leg elevated for several weeks.  If it hurts, it should be elevated.  Follow Up Call my office at [phone] when you are discharged from the hospital or surgery center to schedule an appointment to be seen two weeks after surgery.  Call my office at [phone] if you develop a fever >101.5 F, nausea, vomiting, bleeding from the surgical site or severe pain.

## 2018-09-14 NOTE — Transfer of Care (Signed)
Immediate Anesthesia Transfer of Care Note  Patient: Tina Hunt  Procedure(s) Performed: Right hallux and 2nd metatarsal phalangeal joint collateral ligament and plantar plate repair (Right Foot)  Patient Location: PACU  Anesthesia Type:GA combined with regional for post-op pain  Level of Consciousness: sedated  Airway & Oxygen Therapy: Patient Spontanous Breathing and Patient connected to face mask oxygen  Post-op Assessment: Report given to RN and Post -op Vital signs reviewed and stable  Post vital signs: Reviewed and stable  Last Vitals:  Vitals Value Taken Time  BP    Temp    Pulse    Resp    SpO2      Last Pain:  Vitals:   09/14/18 1042  TempSrc: Oral  PainSc: 2       Patients Stated Pain Goal: 2 (27/87/18 3672)  Complications: No apparent anesthesia complications

## 2018-09-14 NOTE — Anesthesia Postprocedure Evaluation (Signed)
Anesthesia Post Note  Patient: Tina Hunt  Procedure(s) Performed: Right hallux and 2nd metatarsal phalangeal joint collateral ligament and plantar plate repair (Right Foot)     Patient location during evaluation: PACU Anesthesia Type: General Level of consciousness: awake and alert and oriented Pain management: pain level controlled Vital Signs Assessment: post-procedure vital signs reviewed and stable Respiratory status: spontaneous breathing, nonlabored ventilation and respiratory function stable Cardiovascular status: blood pressure returned to baseline and stable Postop Assessment: no apparent nausea or vomiting Anesthetic complications: no    Last Vitals:  Vitals:   09/14/18 1408 09/14/18 1430  BP: 104/65 110/72  Pulse: (!) 56 (!) 58  Resp: 16 15  Temp: 36.6 C   SpO2: 100% 95%    Last Pain:  Vitals:   09/14/18 1430  TempSrc:   PainSc: Asleep                 Dmauri Rosenow A.

## 2018-09-14 NOTE — Anesthesia Procedure Notes (Signed)
Procedure Name: LMA Insertion Date/Time: 09/14/2018 1:01 PM Performed by: Maryella Shivers, CRNA Pre-anesthesia Checklist: Patient identified, Emergency Drugs available, Suction available and Patient being monitored Patient Re-evaluated:Patient Re-evaluated prior to induction Oxygen Delivery Method: Circle system utilized Preoxygenation: Pre-oxygenation with 100% oxygen Induction Type: IV induction Ventilation: Mask ventilation without difficulty LMA: LMA inserted LMA Size: 4.0 Number of attempts: 1 Airway Equipment and Method: Bite block Placement Confirmation: positive ETCO2 Tube secured with: Tape Dental Injury: Teeth and Oropharynx as per pre-operative assessment
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
## 2018-09-14 NOTE — Anesthesia Procedure Notes (Addendum)
Anesthesia Regional Block: Popliteal block   Pre-Anesthetic Checklist: ,, timeout performed, Correct Patient, Correct Site, Correct Laterality, Correct Procedure, Correct Position, site marked, Risks and benefits discussed,  Surgical consent,  Pre-op evaluation,  At surgeon's request and post-op pain management  Laterality: Lower and Right  Prep: chloraprep       Needles:  Injection technique: Single-shot  Needle Type: Echogenic Stimulator Needle     Needle Length: 9cm  Needle Gauge: 21     Additional Needles:   Procedures:,,,, ultrasound used (permanent image in chart),,,,  Narrative:  Start time: 09/14/2018 11:31 AM End time: 09/14/2018 11:36 AM Injection made incrementally with aspirations every 5 mL.  Performed by: Personally  Anesthesiologist: Catalina Gravel, MD  Additional Notes: No pain on injection. No increased resistance to injection. Injection made in 5cc increments.  Good needle visualization.  Patient tolerated procedure well.

## 2018-09-17 ENCOUNTER — Other Ambulatory Visit: Payer: Self-pay

## 2018-09-17 ENCOUNTER — Encounter (HOSPITAL_BASED_OUTPATIENT_CLINIC_OR_DEPARTMENT_OTHER): Payer: Self-pay | Admitting: Orthopedic Surgery

## 2018-09-17 ENCOUNTER — Other Ambulatory Visit: Payer: BC Managed Care – PPO

## 2018-09-17 DIAGNOSIS — Z0000 Encounter for general adult medical examination without abnormal findings: Secondary | ICD-10-CM

## 2018-09-20 ENCOUNTER — Encounter: Payer: BC Managed Care – PPO | Admitting: Adult Health

## 2018-09-27 ENCOUNTER — Other Ambulatory Visit: Payer: Self-pay | Admitting: Adult Health

## 2018-09-28 ENCOUNTER — Other Ambulatory Visit: Payer: Self-pay | Admitting: Adult Health

## 2018-09-28 NOTE — Telephone Encounter (Signed)
lov 08/07/18 and medication was last refilled on 08/28/2018.  [place] Controlled substance report pulled no aberrancies. MPulliam, CMA/RT(R)

## 2018-10-23 ENCOUNTER — Other Ambulatory Visit: Payer: Self-pay | Admitting: Neurology

## 2018-11-24 ENCOUNTER — Other Ambulatory Visit: Payer: Self-pay | Admitting: Nurse Practitioner

## 2018-12-29 ENCOUNTER — Other Ambulatory Visit: Payer: Self-pay | Admitting: Nurse Practitioner

## 2019-01-09 ENCOUNTER — Ambulatory Visit: Payer: BC Managed Care – PPO | Admitting: Nurse Practitioner

## 2019-01-17 ENCOUNTER — Telehealth: Payer: Self-pay | Admitting: Neurology

## 2019-01-17 ENCOUNTER — Ambulatory Visit (INDEPENDENT_AMBULATORY_CARE_PROVIDER_SITE_OTHER): Payer: BC Managed Care – PPO | Admitting: Neurology

## 2019-01-17 ENCOUNTER — Encounter: Payer: Self-pay | Admitting: Neurology

## 2019-01-17 ENCOUNTER — Other Ambulatory Visit: Payer: Self-pay

## 2019-01-17 DIAGNOSIS — G43709 Chronic migraine without aura, not intractable, without status migrainosus: Secondary | ICD-10-CM

## 2019-01-17 DIAGNOSIS — IMO0002 Reserved for concepts with insufficient information to code with codable children: Secondary | ICD-10-CM

## 2019-01-17 MED ORDER — TOPIRAMATE 100 MG PO TABS: 100.0000 mg | ORAL_TABLET | Freq: Two times a day (BID) | ORAL | 4 refills | Status: DC

## 2019-01-17 MED ORDER — PROPRANOLOL HCL 60 MG PO TABS: 60.0000 mg | ORAL_TABLET | Freq: Every day | ORAL | 4 refills | Status: DC

## 2019-01-17 MED ORDER — SUMATRIPTAN SUCCINATE 6 MG/0.5ML ~~LOC~~ SOAJ: 0.5000 mL | SUBCUTANEOUS | 6 refills | Status: DC | PRN

## 2019-01-17 MED ORDER — ERENUMAB-AOOE 70 MG/ML ~~LOC~~ SOAJ: 70.0000 mg | SUBCUTANEOUS | 11 refills | Status: DC

## 2019-01-17 MED ORDER — RIZATRIPTAN BENZOATE 10 MG PO TBDP: ORAL_TABLET | ORAL | 11 refills | Status: DC

## 2019-01-17 NOTE — Telephone Encounter (Addendum)
I have attempted to reach this patient two additional times.  I had to leave a message.  She was last seen in the office in April 2019 and currently has no pending appt.  Dr. Krista Blue is able to see her for a virtual visit. We can get her worked in today.  Dr. Jannifer [place] also left two messages earlier this morning following her after hour calls.  We have not been able to reach the patient.  If she calls back, we are happy to provide help.  I also provided our current hours of operation and phone number on my message.

## 2019-01-17 NOTE — Telephone Encounter (Signed)
The patient called back after 4pm today.  She is still experiencing pain but it has improved throughout the day.  She does report an increase in migraine frequency.  Dr. Krista Blue has agreed to complete a virtual visit with her at 4:30pm today.
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
## 2019-01-17 NOTE — Telephone Encounter (Signed)
Chart reviewed, treating patient for chronic migraine since 2018, last visit was in April 2019.  Please schedule a virtual visit with [place] if she needs help

## 2019-01-17 NOTE — Telephone Encounter (Signed)
The patient called again, I called back again, left another message.  I am unable to reach the patient.

## 2019-01-25 ENCOUNTER — Other Ambulatory Visit: Payer: Self-pay | Admitting: Neurology

## 2019-02-04 ENCOUNTER — Telehealth: Payer: Self-pay | Admitting: Neurology

## 2019-02-04 NOTE — Telephone Encounter (Signed)
Please check her headaches, if she continues to have headaches with home treatment, may consider repeat nerve block  How is her preauthorization for botox?

## 2019-02-04 NOTE — Telephone Encounter (Signed)
Pt called in stating she is having a really bad migraine and wants to know what can be done to help it

## 2019-02-04 NOTE — Telephone Encounter (Signed)
I spoke with the patient. She has been taking medications for breakthrough daily x >1 week. She stated this migraine came on quickly today. She took maxalt, imitrex, phenergan, benadryl. Stated she took a nap and when she woke up she was still nauseated and has a feeling of her scalp/head that was difficult to explain. Said she has head tingling sometimes with migraines. She stated she is taking topamax 100 mg BID & propranolol 60 mg BID. She has R sided neck pain and stated it feels bigger than her L. She said she is seeing some spots, has visual auras sometimes. She stated all symptoms she has are things she has felt before with other migraines, although this is the second worst one she's ever had. She is aware that if she worsens overnight we recommend going to the ED or if she develops any new or concerning symptoms to call 911 and proceed to ED. Pt stated she gets really cold with bad migraines almost like she has a fever and she feels that way today. I advised her a message would be sent to Dr. Krista Blue and she would likely get a call back tomorrow AM as the office is closed now. Pt verbalized understanding and appreciation.

## 2019-02-05 NOTE — Telephone Encounter (Signed)
OK, got it,

## 2019-02-05 NOTE — Telephone Encounter (Signed)
She slept for several hours yesterday.  She also repeated rizatriptan tablets and sumatriptan injections.  Her migraine has started to improve but has not resolved completely.  She would like to come in for a nerve block.  She will arrive to our office, on 02/06/2019, at 7:45am.  She is aware of the new check-in process.  She will bring her own mask.

## 2019-02-06 ENCOUNTER — Encounter: Payer: Self-pay | Admitting: Neurology

## 2019-02-06 ENCOUNTER — Ambulatory Visit (INDEPENDENT_AMBULATORY_CARE_PROVIDER_SITE_OTHER): Payer: BC Managed Care – PPO | Admitting: Neurology

## 2019-02-06 ENCOUNTER — Other Ambulatory Visit: Payer: Self-pay

## 2019-02-06 VITALS — BP 118/84 | HR 74 | Temp 97.3°F | Ht 62.0 in | Wt 291.0 lb

## 2019-02-06 DIAGNOSIS — G43709 Chronic migraine without aura, not intractable, without status migrainosus: Secondary | ICD-10-CM | POA: Diagnosis not present

## 2019-02-06 DIAGNOSIS — IMO0002 Reserved for concepts with insufficient information to code with codable children: Secondary | ICD-10-CM

## 2019-02-06 MED ORDER — ONDANSETRON 4 MG PO TBDP: 4.0000 mg | ORAL_TABLET | Freq: Three times a day (TID) | ORAL | 6 refills | Status: DC | PRN

## 2019-02-06 NOTE — Progress Notes (Signed)
   History:  Patient reported one month history of frequent headaches, more than 15 headache days in one month, currently complains of 7 out of 10 headaches  Bilateral occipital and trigeminal nerve block; trigger point injection of bilateral cervical and upper trapezius muscles for intractable headache  Bupivacaine 0.5% was injected on the scalp bilaterally at several locations:  -On the occipital area of the head, 3 injections each side, 0.5 cc per injection at the midpoint between the mastoid process and the occipital protuberance. 2 other injections were done one finger breadth from the initial injection, one at a 10 o'clock position and the other at a 2 o'clock position.  -2 injections of 0.5 cc were done in the temporal regions, 2 fingerbreadths above the tragus of the ear, with the second injection one fingerbreadth posteriorly to the first.  -2 injections were done on the brow, 1 in the medial brow and one over the supraorbital nerve notch, with 0.1 cc for each injection  -1 injection each side of 0.5 cc was done anterior to the tragus of the ear for a trigeminal ganglion injection  -0.5 cc was injected into bilateral upper trapezius and bilateral upper cervical paraspinals   The patient tolerated the injections well, no complications of the procedure were noted. Injections were made with a 27-gauge needle.

## 2019-04-21 IMAGING — CR DG HIP (WITH OR WITHOUT PELVIS) 2-3V*R*
3 series · 3 of 3 positions shown · non-contrast
Comparison: None.

CLINICAL DATA: Right hip pain for months, no known injury, initial
encounter

EXAM:
DG HIP (WITH OR WITHOUT PELVIS) 2-3V RIGHT

[w hip ap right]
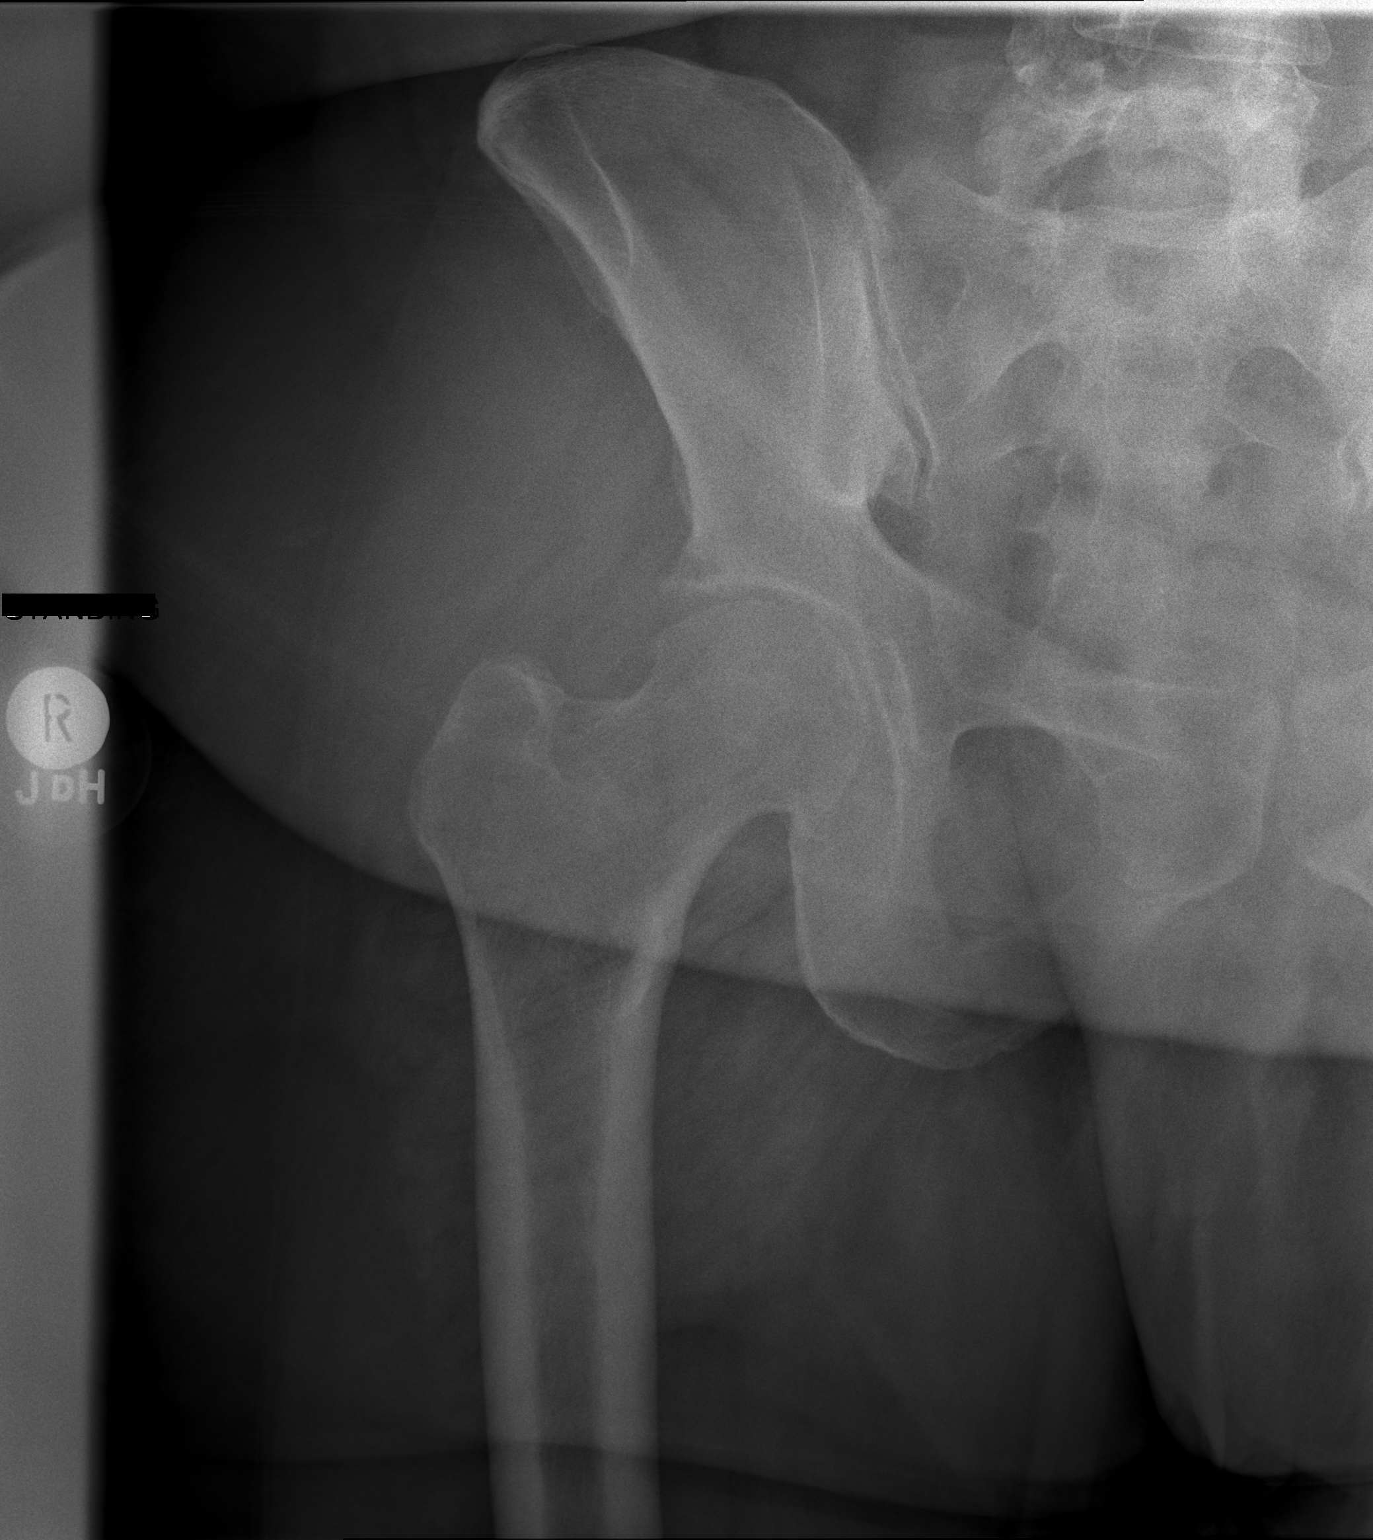

[w hip frog right (1 of 2)]
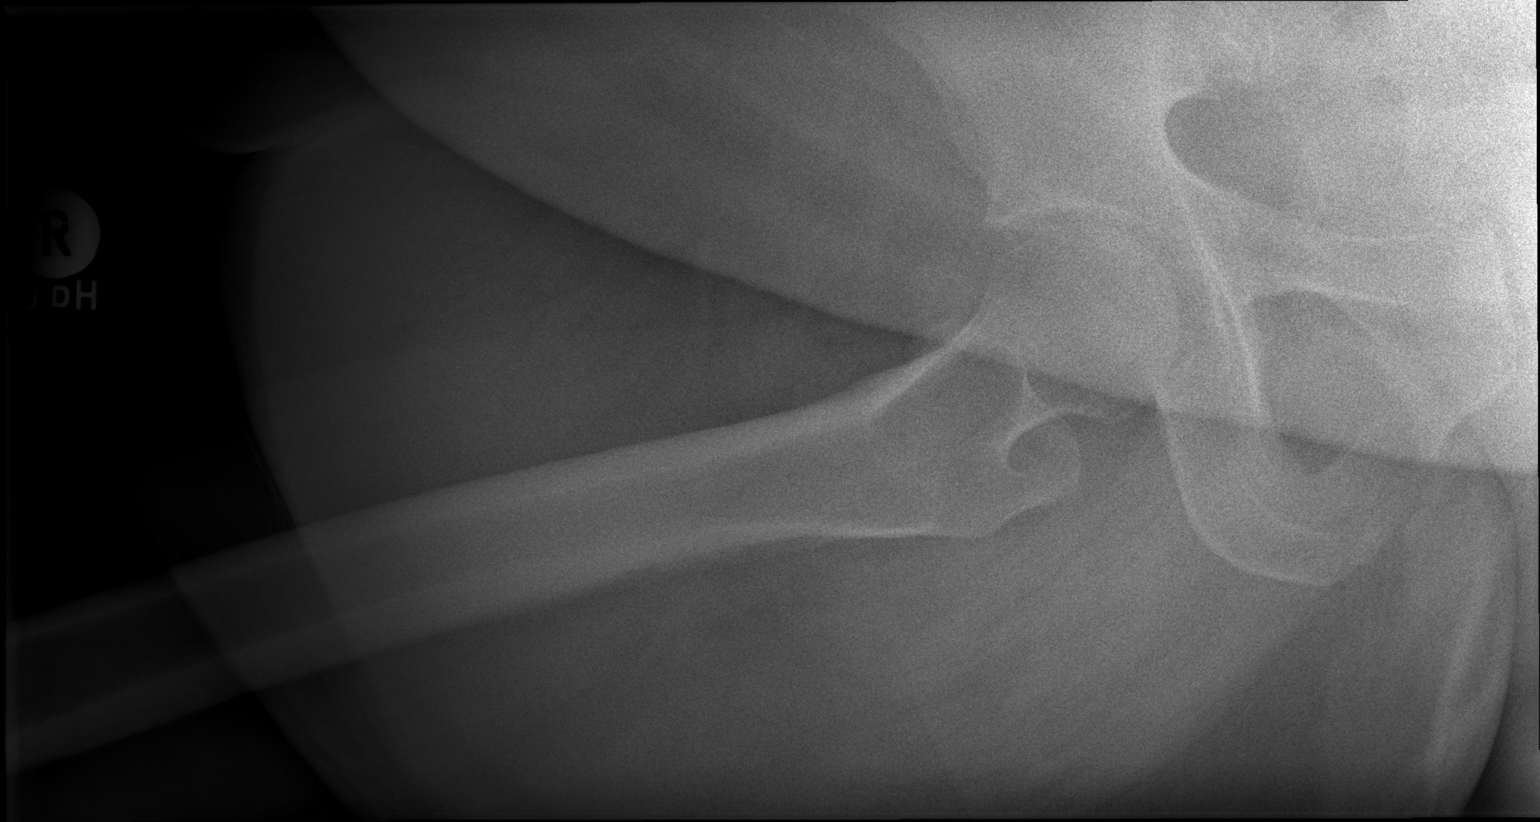

[w hip frog right (2 of 2)]
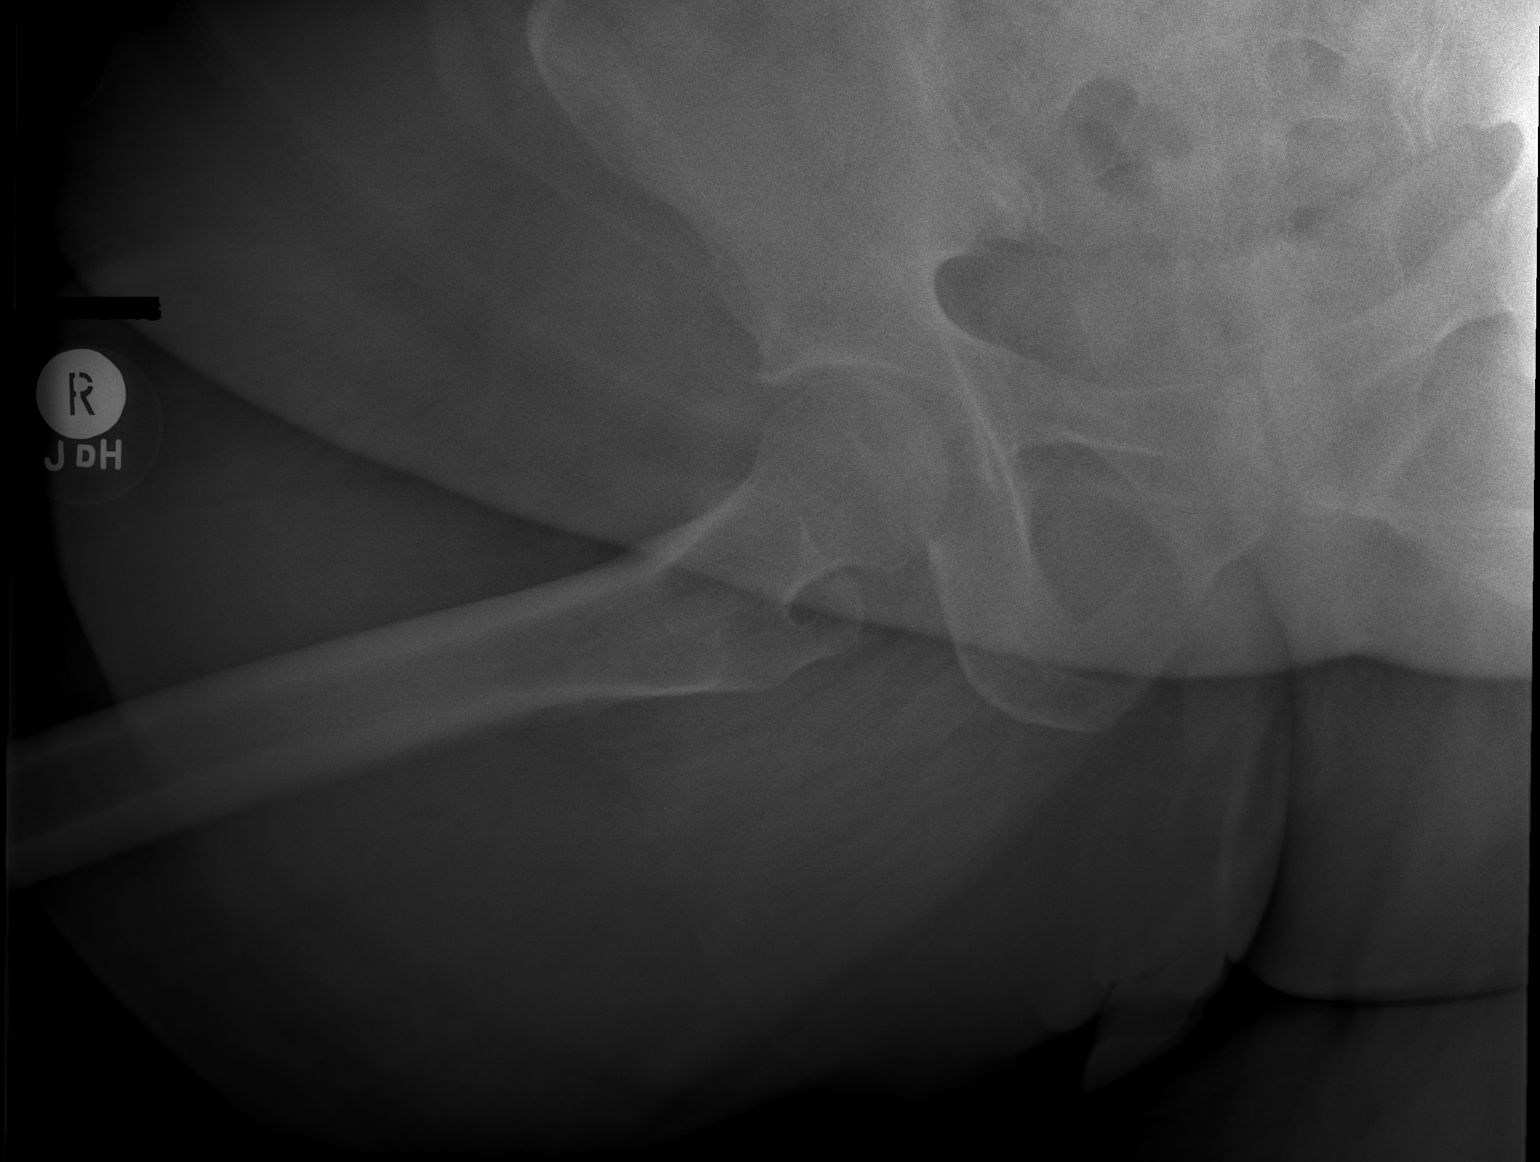

[3 of 3 positions shown; findings below may reference images not displayed]

FINDINGS: Mild degenerative changes of the hip joint are seen. No acute
fracture or dislocation is noted. The visualized pelvic ring is
intact.
IMPRESSION: Mild degenerative changes without acute abnormality.

## 2019-07-23 ENCOUNTER — Telehealth: Payer: Self-pay | Admitting: Neurology

## 2019-07-23 ENCOUNTER — Encounter: Payer: Self-pay | Admitting: *Deleted

## 2019-07-23 NOTE — Telephone Encounter (Addendum)
Per vo by Dr. Krista Blue, letter may be provided to the patient.  I returned her call but there was no answer and the mailbox was full.  I have sent an email and the letter to her mychart account.
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
## 2019-08-06 ENCOUNTER — Other Ambulatory Visit: Payer: Self-pay | Admitting: Neurology

## 2019-08-28 ENCOUNTER — Telehealth: Payer: Self-pay | Admitting: Neurology

## 2019-08-28 NOTE — Telephone Encounter (Signed)
Reports migraine for a few days not resolving with home meds.  She has noticed an increase in migraines lately.  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  The last nerve block in May 2020 was not very helpful.  However, previous nerve blocks have worked for her migraines.  Per vo by Dr. Krista Blue, okay to add her to the schedule.  She is able to come tomorrow morning at 9am (arriving early for check-in).

## 2019-08-28 NOTE — Telephone Encounter (Signed)
Pt is asking for a call from RN to discuss migraines she has been having for a couple of days.

## 2019-08-29 ENCOUNTER — Other Ambulatory Visit: Payer: Self-pay

## 2019-08-29 ENCOUNTER — Encounter: Payer: Self-pay | Admitting: Neurology

## 2019-08-29 ENCOUNTER — Ambulatory Visit (INDEPENDENT_AMBULATORY_CARE_PROVIDER_SITE_OTHER): Payer: BC Managed Care – PPO | Admitting: Neurology

## 2019-08-29 VITALS — BP 132/76 | HR 75 | Temp 97.5°F | Ht 62.0 in | Wt 296.5 lb

## 2019-08-29 DIAGNOSIS — G43011 Migraine without aura, intractable, with status migrainosus: Secondary | ICD-10-CM | POA: Diagnosis not present

## 2019-08-29 DIAGNOSIS — G43709 Chronic migraine without aura, not intractable, without status migrainosus: Secondary | ICD-10-CM

## 2019-08-29 MED ORDER — AIMOVIG 140 MG/ML ~~LOC~~ SOAJ: 140.0000 mg | SUBCUTANEOUS | 11 refills | Status: DC

## 2019-08-29 NOTE — Progress Notes (Signed)
   History:   Intractable migraine for 2 weeks, failed home remedy treatment  Bilateral occipital and trigeminal nerve block; trigger point injection of bilateral cervical and upper trapezius muscles for intractable headache  Bupivacaine 0.5% was injected on the scalp bilaterally at several locations:  -On the occipital area of the head, 3 injections each side, 0.5 cc per injection at the midpoint between the mastoid process and the occipital protuberance. 2 other injections were done one finger breadth from the initial injection, one at a 10 o'clock position and the other at a 2 o'clock position.  -2 injections of 0.5 cc were done in the temporal regions, 2 fingerbreadths above the tragus of the ear, with the second injection one fingerbreadth posteriorly to the first.  -2 injections were done on the brow, 1 in the medial brow and one over the supraorbital nerve notch, with 0.1 cc for each injection  -1 injection each side of 0.5 cc was done anterior to the tragus of the ear for a trigeminal ganglion injection  -0.5 cc was injected into bilateral upper trapezius and bilateral upper cervical paraspinals   The patient tolerated the injections well, no complications of the procedure were noted. Injections were made with a 27-gauge needle.
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
## 2019-09-24 ENCOUNTER — Other Ambulatory Visit: Payer: Self-pay | Admitting: Neurology

## 2019-10-23 ENCOUNTER — Other Ambulatory Visit: Payer: Self-pay | Admitting: Neurology

## 2019-10-25 ENCOUNTER — Other Ambulatory Visit: Payer: Self-pay | Admitting: Neurology

## 2019-11-26 ENCOUNTER — Other Ambulatory Visit: Payer: Self-pay | Admitting: *Deleted

## 2019-11-26 MED ORDER — SUMATRIPTAN SUCCINATE 6 MG/0.5ML ~~LOC~~ SOAJ: 0.5000 mL | SUBCUTANEOUS | 11 refills | Status: DC | PRN

## 2020-02-02 ENCOUNTER — Other Ambulatory Visit: Payer: Self-pay | Admitting: Neurology

## 2020-02-03 ENCOUNTER — Other Ambulatory Visit: Payer: Self-pay | Admitting: Neurology

## 2020-03-22 ENCOUNTER — Other Ambulatory Visit: Payer: Self-pay | Admitting: Neurology

## 2020-04-03 ENCOUNTER — Other Ambulatory Visit: Payer: Self-pay | Admitting: Neurology

## 2020-04-20 ENCOUNTER — Other Ambulatory Visit: Payer: Self-pay | Admitting: Neurology

## 2020-06-16 ENCOUNTER — Other Ambulatory Visit: Payer: Self-pay | Admitting: Neurology

## 2020-07-02 ENCOUNTER — Other Ambulatory Visit: Payer: Self-pay | Admitting: Neurology

## 2020-07-09 ENCOUNTER — Other Ambulatory Visit: Payer: Self-pay | Admitting: Neurology

## 2020-08-05 ENCOUNTER — Telehealth: Payer: Self-pay | Admitting: Neurology

## 2020-08-05 NOTE — Telephone Encounter (Signed)
Pt has called to report a migraine she has  unable to shake/get rid of.  Pt is asking if she can come in for a cocktail or an injection.  Please call.

## 2020-08-05 NOTE — Telephone Encounter (Signed)
Pt returned phone call, would like a call back.  

## 2020-08-05 NOTE — Telephone Encounter (Signed)
I returned the call to the patient. Reports having a persistent migraine over the last week. Failed home meds. She has been in for nerve blocks in the past - some have worked better than others. She has had good success with infusions in the past but she is unsure of which medications she has received. She has never had one in our office. States she would prefer to try an IV this time because she feels rather dehydrated. She was also instructed to increase her water intake.  She was last seen 08/29/19. I ask her to schedule a routine follow up while we were on the phone today. She was placed in an open slot with Sarah on 08/06/20.  Per vo by Dr.Yan, she can come in for an infusion of the following medications:  1) Depakote 1000mg  2) Toradol 30mg   3) compazine 10mg    Signed MD orders provided to [facility]. The patient will come in today with a driver.

## 2020-08-05 NOTE — Telephone Encounter (Signed)
Left message requesting a return call.

## 2020-08-06 ENCOUNTER — Ambulatory Visit: Payer: BC Managed Care – PPO | Admitting: Neurology

## 2020-08-06 ENCOUNTER — Encounter: Payer: Self-pay | Admitting: Neurology

## 2020-08-06 VITALS — BP 132/80 | HR 85 | Ht 62.0 in | Wt 301.4 lb

## 2020-08-06 DIAGNOSIS — G43011 Migraine without aura, intractable, with status migrainosus: Secondary | ICD-10-CM

## 2020-08-06 MED ORDER — TOPIRAMATE 100 MG PO TABS: 100.0000 mg | ORAL_TABLET | Freq: Two times a day (BID) | ORAL | 3 refills | Status: DC

## 2020-08-06 MED ORDER — SUMATRIPTAN SUCCINATE 6 MG/0.5ML ~~LOC~~ SOAJ: 0.5000 mL | SUBCUTANEOUS | 11 refills | Status: DC | PRN

## 2020-08-06 MED ORDER — RIZATRIPTAN BENZOATE 10 MG PO TBDP: ORAL_TABLET | ORAL | 3 refills | Status: DC

## 2020-08-06 MED ORDER — ONDANSETRON 4 MG PO TBDP: ORAL_TABLET | ORAL | 3 refills | Status: DC

## 2020-08-06 MED ORDER — PROMETHAZINE HCL 25 MG PO TABS: ORAL_TABLET | ORAL | 3 refills | Status: DC

## 2020-08-06 MED ORDER — PROPRANOLOL HCL 60 MG PO TABS: 60.0000 mg | ORAL_TABLET | Freq: Every day | ORAL | 2 refills | Status: DC

## 2020-08-06 MED ORDER — AIMOVIG 140 MG/ML ~~LOC~~ SOAJ: 140.0000 mg | SUBCUTANEOUS | 11 refills | Status: DC
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
## 2020-08-06 NOTE — Patient Instructions (Signed)
Try cutting back the Topamax taking 50 mg in the morning, 100 mg at bedtime, try for several weeks if headaches are well controlled eliminate the morning dose Keep other medications  See you back in 6 months

## 2020-08-17 NOTE — Progress Notes (Signed)
I have reviewed and agreed above plan. 

## 2020-08-24 ENCOUNTER — Emergency Department (HOSPITAL_COMMUNITY): Payer: BC Managed Care – PPO

## 2020-08-24 ENCOUNTER — Other Ambulatory Visit: Payer: Self-pay

## 2020-08-24 ENCOUNTER — Emergency Department (HOSPITAL_COMMUNITY)
Admission: EM | Admit: 2020-08-24 | Discharge: 2020-08-24 | Disposition: A | Discharge status: Home / Self Care | Payer: BC Managed Care – PPO | Attending: Emergency Medicine | Admitting: Emergency Medicine

## 2020-08-24 ENCOUNTER — Encounter (HOSPITAL_COMMUNITY): Payer: Self-pay

## 2020-08-24 DIAGNOSIS — W19XXXA Unspecified fall, initial encounter: Secondary | ICD-10-CM | POA: Diagnosis not present

## 2020-08-24 DIAGNOSIS — Z96652 Presence of left artificial knee joint: Secondary | ICD-10-CM | POA: Diagnosis not present

## 2020-08-24 DIAGNOSIS — M5441 Lumbago with sciatica, right side: Secondary | ICD-10-CM | POA: Diagnosis not present

## 2020-08-24 DIAGNOSIS — Z79899 Other long term (current) drug therapy: Secondary | ICD-10-CM | POA: Diagnosis not present

## 2020-08-24 DIAGNOSIS — M545 Low back pain, unspecified: Secondary | ICD-10-CM | POA: Diagnosis present

## 2020-08-24 NOTE — ED Provider Notes (Signed)
[facility] DEPT Provider Note   CSN: [id number] Arrival date & time: 08/24/20  1831     History Chief Complaint  Patient presents with  . Fall    Tina Hunt is a 44 y.o. female.  The history is provided by the patient. No language interpreter was used.  Fall This is a new problem. Episode onset: 1 month. The problem occurs constantly. The problem has been rapidly worsening. Nothing aggravates the symptoms. Nothing relieves the symptoms. She has tried nothing for the symptoms. The treatment provided no relief.  Pt reports she was sent from urgent care for emergent MRI.  Pt reports she fell a month ago and injured her back.  Pt has been seen x 3 at urgent care.  Pt reports she talked to  Dr. Nelva Bush who advised MRI.Pt reports she has lost control of her bladder.  Pt reports she is urinating on herself.     Past Medical History:  Diagnosis Date  . Anxiety   . Arthritis    "a little in Right knee"  . Chronic back pain   . Depression   . Endometriosis   . GERD (gastroesophageal reflux disease)   . Hallux varus (acquired), right foot   . Headache(784.0)    MIGRAINES   . History of blood transfusion 01/10/2003   [place] - 3 units transfused  . Migraine   . Neck pain    clench teeth at night  . Neuromuscular disorder ([place])    BLACKOUT SPELLS  BUT NONE SINCE 1996   . Pneumonia    hx of 3 years ago  . PONV (postoperative nausea and vomiting)    with tonsils as child, no problems as adult  . Recurrent sinusitis   . Sleep apnea    Does not use CPAP  . Tuberculosis    TB + SKIN TEST, CXR was normal    Patient Active Problem List   Diagnosis Date Noted  . Intractable migraine without aura and with status migrainosus 08/29/2019  . Healthcare maintenance 05/03/2018  . Anxiety 05/03/2018  . Depression 05/03/2018  . Acute post-operative pain 03/26/2017  . Chronic migraine 12/22/2016  . Migraine 12/22/2016  . Acute medial meniscal tear  03/11/2014  . Eustachian tube dysfunction 11/28/2011    Past Surgical History:  Procedure Laterality Date  . ADENOIDECTOMY  1995   with tube placement  . CESAREAN SECTION     2004   . CHROMOPERTUBATION Bilateral 10/09/2015   Procedure: CHROMOPERTUBATION;  Surgeon: Everett Graff, MD;  Location: [facility];  Service: Gynecology;  Laterality: Bilateral;  . CYSTOSCOPY Bilateral 03/20/2017   Procedure: CYSTOSCOPY;  Surgeon: Everett Graff, MD;  Location: [facility];  Service: Gynecology;  Laterality: Bilateral;  . DILITATION & CURRETTAGE/HYSTROSCOPY WITH NOVASURE ABLATION N/A 10/09/2015   Procedure: DILATATION & CURETTAGE/HYSTEROSCOPY;  Surgeon: Everett Graff, MD;  Location: [facility];  Service: Gynecology;  Laterality: N/A;  . KNEE ARTHROSCOPY Left    1988   . KNEE ARTHROSCOPY Left 03/12/2014   Procedure: LEFT ARTHROSCOPY KNEE WITH DEBRIDEMENT;  Surgeon: Gearlean Alf, MD;  Location: [facility];  Service: Orthopedics;  Laterality: Left;  medical and lateral repair menius  . LAPAROSCOPIC OVARIAN CYSTECTOMY Right 10/09/2015   Procedure: LAPAROSCOPIC RIGHT OVARIAN CYSTECTOMIES, LEFT PARATUBAL CYSTECTOMY ;  Surgeon: Everett Graff, MD;  Location: [facility];  Service: Gynecology;  Laterality: Right;  . LIGAMENT REPAIR Right 09/14/2018   Procedure: Right hallux and 2nd metatarsal phalangeal joint collateral ligament and plantar plate repair;  Surgeon: Wylene Simmer, MD;  Location: [facility];  Service: Orthopedics;  Laterality: Right;  71min  . SLEEVE GASTROPLASTY    . TONSILLECTOMY  1983, 1985   ADENOID X2 (SEPARATE SURGERY)  TUBE PLACEMENT  . TOTAL LAPAROSCOPIC HYSTERECTOMY WITH SALPINGECTOMY Bilateral 03/20/2017   Procedure: HYSTERECTOMY TOTAL LAPAROSCOPIC WITH BILATERAL SALPINGECTOMY;  Surgeon: Everett Graff, MD;  Location: [facility];  Service: Gynecology;  Laterality: Bilateral;  . [place], 2002, 2006, 2009, 2013     OB History   No obstetric history on  file.     Family History  Problem Relation Age of Onset  . Diabetes Father   . Heart attack Father   . CAD Father   . Alcohol abuse Father   . Stroke Mother   . Diabetes Paternal Grandfather   . Heart attack Paternal Grandfather   . Hypertension Paternal Grandfather   . Alcohol abuse Paternal Grandfather   . Lung cancer Maternal Grandfather     Social History   Tobacco Use  . Smoking status: Never Smoker  . Smokeless tobacco: Never Used  Vaping Use  . Vaping Use: Never used  Substance Use Topics  . Alcohol use: Yes    Alcohol/week: 1.0 standard drink    Types: 1 Glasses of wine per week    Comment: social  . Drug use: No    Home Medications Prior to Admission medications   Medication Sig Start Date End Date Taking? Authorizing Provider  cholecalciferol (VITAMIN D3) 25 MCG (1000 UT) tablet Take 2,000 Units by mouth daily.    [provider]  clonazePAM (KLONOPIN) 0.5 MG tablet TAKE 1 TABLET DAILY AS NEEDED FOR ACUTE ANXIETY. 09/28/18   Danford, Valetta Fuller D, NP  diphenhydrAMINE (BENADRYL) 25 mg capsule Take 25 mg by mouth every 6 (six) hours as needed for allergies.    [provider]  DULoxetine (CYMBALTA) 60 MG capsule 2 tabs once daily 08/07/18   Danford, Valetta Fuller D, NP  Erenumab-aooe (AIMOVIG) 140 MG/ML SOAJ Inject 140 mg into the skin every 30 (thirty) days. 08/06/20   Suzzanne Cloud, NP  methocarbamol (ROBAXIN) 500 MG tablet Take 1 tablet (500 mg total) by mouth 4 (four) times daily. Patient taking differently: Take 500 mg by mouth at bedtime as needed for muscle spasms.  03/12/14   Gaynelle Arabian, MD  Multiple Vitamin (MULTIVITAMIN) tablet Take 1 tablet by mouth daily.    [provider]  omeprazole (PRILOSEC) 20 MG capsule Take 20 mg by mouth 2 (two) times daily before a meal.  12/19/17   [provider]  ondansetron (ZOFRAN-ODT) 4 MG disintegrating tablet DISSOLVE 1 TABLET ON TONGUE EVERY 8 HOURS AS NEEDED. 08/06/20   Suzzanne Cloud, NP   promethazine (PHENERGAN) 25 MG tablet TAKE 1 TABLET EVERY 6 HOURS AS NEEDED FOR NAUSEA & VOMITING. Please call for appt. 08/06/20   Suzzanne Cloud, NP  propranolol (INDERAL) 60 MG tablet Take 1 tablet (60 mg total) by mouth daily. 08/06/20   Suzzanne Cloud, NP  rizatriptan (MAXALT-MLT) 10 MG disintegrating tablet Take 1 tab at onset of migraine.  May repeat in 2 hrs, if needed.  Max dose: 2 tabs/day. This is a 30 day prescription. Please call for appt. 08/06/20   Suzzanne Cloud, NP  SUMAtriptan (IMITREX STATDOSE SYSTEM) 6 MG/0.5ML SOAJ Inject 0.5 mLs into the skin as needed (use if maxalt fails). 08/06/20   Suzzanne Cloud, NP  thiamine (VITAMIN B-1) 100 MG  tablet Take 1 tablet by mouth daily.    [provider]  topiramate (TOPAMAX) 100 MG tablet Take 1 tablet (100 mg total) by mouth 2 (two) times daily. 08/06/20   Suzzanne Cloud, NP    Allergies    Tape, Aspirin, Biaxin [clarithromycin], Nsaids, and Penicillins  Review of Systems   Review of Systems  Genitourinary: Negative for enuresis.  All other systems reviewed and are negative.   Physical Exam Updated Vital Signs BP 119/73 (BP Location: Right Arm)   Pulse 64   Temp 98.1 F (36.7 C) (Oral)   Resp 18   Ht 5\' 2"  (1.575 m)   Wt 131.5 kg   LMP  (LMP Unknown)   SpO2 98%   BMI 53.04 kg/m   Physical Exam Vitals and nursing note reviewed.  Constitutional:      Appearance: She is well-developed.  HENT:     Head: Normocephalic.  Cardiovascular:     Rate and Rhythm: Normal rate.  Pulmonary:     Effort: Pulmonary effort is normal.  Abdominal:     General: Abdomen is flat. There is no distension.  Musculoskeletal:        General: Normal range of motion.     Cervical back: Normal range of motion.     Comments: Tender low back sacral area.  nv and ns intact   Skin:    General: Skin is warm.  Neurological:     General: No focal deficit present.     Mental Status: She is alert and oriented to person, place, and time.   Psychiatric:        Mood and Affect: Mood normal.     ED Results / Procedures / Treatments   Labs (all labs ordered are listed, but only abnormal results are displayed) Labs Reviewed - No data to display  EKG None  Radiology No results found.  Procedures Procedures (including critical care time)  Medications Ordered in ED Medications - No data to display  ED Course  I have reviewed the triage vital signs and the nursing notes.  Pertinent labs & imaging results that were available during my care of the patient were reviewed by me and considered in my medical decision making (see chart for details).    MDM Rules/Calculators/A&P                          MDM:  MRI no acute nerve compression.  Pt has chronic changes and disc bulge with out foraminal compression  Final Clinical Impression(s) / ED Diagnoses Final diagnoses:  Fall, initial encounter  Acute low back pain with right-sided sciatica, unspecified back pain laterality    Rx / DC Orders ED Discharge Orders    None    An After Visit Summary was printed and given to the patient.    Fransico Meadow, Hershal Coria 08/24/20 2302    Charlesetta Shanks, MD 09/04/20 [phone]

## 2020-08-24 NOTE — Discharge Instructions (Addendum)
Continue current pain medication.  Follow up with Dr. Nelva Bush as scheduled

## 2020-08-24 NOTE — ED Triage Notes (Signed)
Patient states she had a fall in September/2021. patient reports difficulty walking and stiffness. Patient states she has needed a cane. Patient states she has had increased pain in the past 2-3 weeks. Patient states that she has had numbness in the pelvic area. Patient also reports that she has urinary urgency especially at night.  Patient states that she was sent to the ED for an MRI.

## 2020-09-25 ENCOUNTER — Other Ambulatory Visit: Payer: Self-pay | Admitting: Neurology

## 2020-09-28 ENCOUNTER — Telehealth: Payer: Self-pay | Admitting: Adult Health

## 2020-09-28 NOTE — Telephone Encounter (Signed)
Patient called on call service 5 minutes before the office opened reporting ongoing migraine. Would like a call back.   I was on call this weekend. I did not call this patient back since it was almost 8:00. Can you call the patient?

## 2020-09-28 NOTE — Telephone Encounter (Addendum)
Called pt and she has had a migraine since lat Wednesday.  Has been fighting it with oral/ inj triptans, taking daily Wed,  Thursday, Sunday felt hangover like, then today woke up with vengeance. Asking for infusion or nerve block, but would like infusion preferred.  I relayed that infusion suite closed until 1100 when one RN is there.  Pt is nauseous too, taking phenergan. Level 9.

## 2020-09-28 NOTE — Telephone Encounter (Addendum)
I called pt and LMVM relayed that ok to come in for infusion.  intrafusion here till 1200.  Ok for [employer] and compazine.  Pt had taken phenergan, would need driver as drowsy.  I relayed to come in now.  I spoke to pt and she will come in takes 20 minutes.  Lafonda Mosses in intrafusion is aware.

## 2020-09-28 NOTE — Telephone Encounter (Signed)
Pt is asking to be scheduled for a migraine infusion/nerve block for sometime today.  Please call.

## 2020-09-28 NOTE — Telephone Encounter (Signed)
Placed note in intrafusion suite for them to view when they come in at 1100.

## 2020-10-07 ENCOUNTER — Telehealth: Payer: Self-pay | Admitting: Neurology

## 2020-10-07 MED ORDER — NURTEC 75 MG PO TBDP: 75.0000 mg | ORAL_TABLET | ORAL | 11 refills | Status: DC | PRN

## 2020-10-07 MED ORDER — PREDNISONE 5 MG PO TABS: ORAL_TABLET | ORAL | 0 refills | Status: DC

## 2020-10-07 NOTE — Addendum Note (Signed)
Addended by: Suzzanne Cloud on: 10/07/2020 04:49 PM   Modules accepted: Orders

## 2020-10-07 NOTE — Telephone Encounter (Signed)
Spoke to pt and let her know that the prednisone taper and NURTEC call in for her.  She verbalized understanding. Will call back as needed.

## 2020-10-07 NOTE — Telephone Encounter (Signed)
Please call, find out what is going on with headaches. Last seen Nov, headaches had in general doing well with preventative therapy Aimovig, Topamax. We did decrease Topamax slightly due to side effect.   Had IV infusion Nov, did she come in January 3rd for infusion? I see in ER 09/28/20, given IV Benadryl, Toradol, Decadron, Compazine   Would need to check with Dr .Krista Blue about nerve block. We could try Nurtec to break cycle of migraine or even prednisone taper. Do we need to change preventative treatment, influx of calls lately about migraines.

## 2020-10-07 NOTE — Telephone Encounter (Signed)
I sent in Nurtec, prednisone 6 day taper.

## 2020-10-07 NOTE — Telephone Encounter (Signed)
Pt called wanting to know if she can come in for a nerve block. Please advise.

## 2020-10-07 NOTE — Telephone Encounter (Signed)
I called pt and she stated she did go to ED and it did help.  It has slowly creeped back not sure of triggers although last week craved chocolate and then weather.  She feels like this time weather related started coming back 10-06-19  Level 7, she has had nausea.  Pain in neck up to back of head.  Took motrin and tylenol eased some. She is not pregnant,  No diabetes.  Ok to try prednisone and nurtec. (I relayed that nurtec used onset of migraine, would need PA for that one.  Will see if nerve block possibility if prednisone not help.  She appreciated call.

## 2020-11-16 ENCOUNTER — Telehealth: Payer: Self-pay | Admitting: *Deleted

## 2020-11-16 NOTE — Telephone Encounter (Signed)
The patient has been using Aimovig 140mg  monthly for her migraine prevention.  Unfortunately, her formulary has changed this year with her insurance plan.  The preferred medications are either Ajovy or Emgality.  We will need to check with Butler Denmark, NP to see if one of these would be an appropriate switch for her therapy.

## 2020-11-17 ENCOUNTER — Other Ambulatory Visit: Payer: Self-pay | Admitting: Neurology

## 2020-11-17 MED ORDER — AJOVY 225 MG/1.5ML ~~LOC~~ SOAJ: 225.0000 mg | SUBCUTANEOUS | 11 refills | Status: DC

## 2020-11-17 NOTE — Telephone Encounter (Signed)
I will switch to Ajovy. I will d/c the Aimovig and order Ajovy.

## 2020-11-17 NOTE — Telephone Encounter (Addendum)
I tried to reach the patient yesterday afternoon and this morning. I left her a detailed message (ok per DPR). I informed her about the formulary changes on her plan. I explained that Aimovig is being replaced by Ajovy so it will be covered by insurance. I reviewed that she will still be injecting once monthly for migraine prevention. Hopefully, Ajovy will not need a PA since it is a preferred medication but we will complete, if needed. I provided our call back number for any questions.   I spoke to [facility] to provide this update. The pharmacist was able to get Ajovy to process with no PA. There was a co-pay of $30.  I called the patient again and encouraged her to download a co-pay savings card to further reduce her cost.

## 2020-11-17 NOTE — Addendum Note (Signed)
Addended by: Suzzanne Cloud on: 11/17/2020 05:55 AM   Modules accepted: Orders

## 2021-02-18 ENCOUNTER — Ambulatory Visit: Payer: BC Managed Care – PPO | Admitting: Neurology

## 2021-02-18 ENCOUNTER — Telehealth: Payer: Self-pay | Admitting: Neurology

## 2021-02-18 NOTE — Telephone Encounter (Signed)
Noted  

## 2021-04-04 ENCOUNTER — Other Ambulatory Visit: Payer: Self-pay | Admitting: Neurology

## 2021-04-05 ENCOUNTER — Encounter: Payer: Self-pay | Admitting: Emergency Medicine

## 2021-06-29 ENCOUNTER — Other Ambulatory Visit: Payer: Self-pay | Admitting: Neurology

## 2021-08-16 ENCOUNTER — Emergency Department (HOSPITAL_COMMUNITY): Payer: BC Managed Care – PPO

## 2021-08-16 ENCOUNTER — Ambulatory Visit (HOSPITAL_COMMUNITY)
Admission: EM | Admit: 2021-08-16 | Discharge: 2021-08-16 | Disposition: A | Discharge status: Home / Self Care | Payer: BC Managed Care – PPO

## 2021-08-16 ENCOUNTER — Other Ambulatory Visit: Payer: Self-pay

## 2021-08-16 ENCOUNTER — Encounter (HOSPITAL_COMMUNITY): Payer: Self-pay | Admitting: Emergency Medicine

## 2021-08-16 ENCOUNTER — Emergency Department (HOSPITAL_COMMUNITY)
Admission: EM | Admit: 2021-08-16 | Discharge: 2021-08-17 | Disposition: A | Discharge status: Home / Self Care | Payer: BC Managed Care – PPO | Attending: Emergency Medicine | Admitting: Emergency Medicine

## 2021-08-16 DIAGNOSIS — R0602 Shortness of breath: Secondary | ICD-10-CM | POA: Diagnosis not present

## 2021-08-16 DIAGNOSIS — R030 Elevated blood-pressure reading, without diagnosis of hypertension: Secondary | ICD-10-CM

## 2021-08-16 DIAGNOSIS — R112 Nausea with vomiting, unspecified: Secondary | ICD-10-CM | POA: Insufficient documentation

## 2021-08-16 DIAGNOSIS — Z20822 Contact with and (suspected) exposure to covid-19: Secondary | ICD-10-CM | POA: Insufficient documentation

## 2021-08-16 DIAGNOSIS — R42 Dizziness and giddiness: Secondary | ICD-10-CM | POA: Diagnosis present

## 2021-08-16 DIAGNOSIS — Z79899 Other long term (current) drug therapy: Secondary | ICD-10-CM | POA: Insufficient documentation

## 2021-08-16 DIAGNOSIS — I10 Essential (primary) hypertension: Secondary | ICD-10-CM | POA: Insufficient documentation

## 2021-08-16 LAB — BASIC METABOLIC PANEL
Anion gap: 11 (ref 5–15)
BUN: 11 mg/dL (ref 6–20)
CO2: 22 mmol/L (ref 22–32)
Calcium: 9.6 mg/dL (ref 8.9–10.3)
Chloride: 107 mmol/L (ref 98–111)
Creatinine, Ser: 0.88 mg/dL (ref 0.44–1.00)
GFR, Estimated: >60 mL/min (ref >60)
Glucose, Bld: 114 mg/dL — ABNORMAL HIGH (ref 70–99)
Potassium: 3.6 mmol/L (ref 3.5–5.1)
Sodium: 140 mmol/L (ref 135–145)

## 2021-08-16 LAB — CBC WITH DIFFERENTIAL/PLATELET
Abs Immature Granulocytes: 0.02 10*3/uL (ref 0.00–0.07)
Basophils Absolute: 0.1 10*3/uL (ref 0.0–0.1)
Basophils Relative: 1 %
Eosinophils Absolute: 0.1 10*3/uL (ref 0.0–0.5)
Eosinophils Relative: 2 %
HCT: 43.2 % (ref 36.0–46.0)
Hemoglobin: 14.5 g/dL (ref 12.0–15.0)
Immature Granulocytes: 0 %
Lymphocytes Relative: 32 %
Lymphs Abs: 2.5 10*3/uL (ref 0.7–4.0)
MCH: 33.3 pg (ref 26.0–34.0)
MCHC: 33.6 g/dL (ref 30.0–36.0)
MCV: 99.3 fL (ref 80.0–100.0)
Monocytes Absolute: 0.6 10*3/uL (ref 0.1–1.0)
Monocytes Relative: 8 %
Neutro Abs: 4.6 10*3/uL (ref 1.7–7.7)
Neutrophils Relative %: 57 %
Platelets: 247 10*3/uL (ref 150–400)
RBC: 4.35 MIL/uL (ref 3.87–5.11)
RDW: 13.1 % (ref 11.5–15.5)
WBC: 8 10*3/uL (ref 4.0–10.5)
nRBC: 0 % (ref 0.0–0.2)

## 2021-08-16 LAB — RESP PANEL BY RT-PCR (FLU A&B, COVID) ARPGX2
Influenza A by PCR: NEGATIVE
Influenza B by PCR: NEGATIVE
SARS Coronavirus 2 by RT PCR: NEGATIVE

## 2021-08-16 LAB — BRAIN NATRIURETIC PEPTIDE: B Natriuretic Peptide: 19.5 pg/mL (ref 0.0–100.0)

## 2021-08-16 NOTE — ED Triage Notes (Signed)
Patient reports SOB ,emesis x2, hypertension with dizziness and feeling jittery this evening .

## 2021-08-16 NOTE — ED Provider Notes (Signed)
Emergency Medicine Provider Triage Evaluation Note  Tina Hunt , a 45 y.o. female  was evaluated in triage.  Pt complains of shortness of breath and shaking.  This happened earlier today, symptoms have been constant and worsening.  She was seen in urgent care earlier and sent to the ED immediately due to concerns about her shortness of breath.  She vomited twice today, denies any chest pain although there is some nausea.  No abdominal pain.  No history of the same..  Review of Systems  Positive: Shortness of breath, tremors, weakness, vomiting Negative: Chest pain, abdominal pain, syncope  Physical Exam  Ht 5\' 1"  (1.549 m)   Wt (!) 140 kg   LMP  (LMP Unknown)   BMI 58.32 kg/m  Gen:   Awake, no distress   Resp:  Normal effort  MSK:   Moves extremities without difficulty  Other:  Radial pulse 2+ bilaterally, abdomen is soft and nontender  Medical Decision Making  Medically screening exam initiated at 8:56 PM.  Appropriate orders placed.  [name] was informed that the remainder of the evaluation will be completed by another provider, this initial triage assessment does not replace that evaluation, and the importance of remaining in the ED until their evaluation is complete.  Shortness of breath   Sherrill Raring, Hershal Coria 08/16/21 2058    Regan Lemming, MD 08/16/21 2113

## 2021-08-16 NOTE — ED Triage Notes (Signed)
Patient complain off high blood pressure today at work. She does report vomiting in the office today.

## 2021-08-17 ENCOUNTER — Emergency Department (HOSPITAL_COMMUNITY): Payer: BC Managed Care – PPO

## 2021-08-17 LAB — TROPONIN I (HIGH SENSITIVITY): Troponin I (High Sensitivity): 5 ng/L (ref <18)

## 2021-08-17 LAB — TSH: TSH: 1.431 u[IU]/mL (ref 0.350–4.500)

## 2021-08-17 LAB — D-DIMER, QUANTITATIVE: D-Dimer, Quant: 0.4 ug/mL-FEU (ref 0.00–0.50)

## 2021-08-17 MED ORDER — PROPRANOLOL HCL 60 MG PO TABS: 60.0000 mg | ORAL_TABLET | Freq: Every day | ORAL | 0 refills | Status: DC

## 2021-08-17 MED ORDER — METOCLOPRAMIDE HCL 5 MG/ML IJ SOLN
10.0000 mg | Freq: Once | INTRAMUSCULAR | Status: AC
  Administered 2021-08-17: 10 mg via INTRAVENOUS
  Filled 2021-08-17: qty 2

## 2021-08-17 MED ORDER — PROPRANOLOL HCL 60 MG PO TABS
60.0000 mg | ORAL_TABLET | Freq: Once | ORAL | Status: AC
  Administered 2021-08-17: 60 mg via ORAL
  Filled 2021-08-17: qty 1

## 2021-08-17 MED ORDER — DIPHENHYDRAMINE HCL 50 MG/ML IJ SOLN
50.0000 mg | Freq: Once | INTRAMUSCULAR | Status: AC
  Administered 2021-08-17: 50 mg via INTRAVENOUS
  Filled 2021-08-17: qty 1

## 2021-08-17 MED ORDER — KETOROLAC TROMETHAMINE 15 MG/ML IJ SOLN
15.0000 mg | Freq: Once | INTRAMUSCULAR | Status: AC
  Administered 2021-08-17: 15 mg via INTRAVENOUS
  Filled 2021-08-17: qty 1

## 2021-08-17 MED ORDER — SODIUM CHLORIDE 0.9 % IV BOLUS
1000.0000 mL | Freq: Once | INTRAVENOUS | Status: AC
  Administered 2021-08-17: 1000 mL via INTRAVENOUS

## 2021-08-17 NOTE — ED Provider Notes (Signed)
Tina Hunt    CSN: [id number] Arrival date & time: 08/16/21  1930      History   Chief Complaint Chief Complaint  Patient presents with   Hypertension    HPI Tina Hunt is a 45 y.o. female.   Patient presents with dizziness, feeling jittery and vomiting 3 times within the last hour.  Endorses shortness of breath that began 30 minutes to an hour ago while in lobby.  Was advised to go to emergency department by nurse at job, blood pressure was elevated with blurred vision peaking at the 170s over low 100s.  Denies cardiac history.  Endorses family history.   Past Medical History:  Diagnosis Date   Anxiety    Arthritis    "a little in Right knee"   Chronic back pain    Depression    Endometriosis    GERD (gastroesophageal reflux disease)    Hallux varus (acquired), right foot    Headache(784.0)    MIGRAINES    History of blood transfusion 01/10/2003   [place] - 3 units transfused   Migraine    Neck pain    clench teeth at night   Neuromuscular disorder ([place])    BLACKOUT SPELLS  BUT NONE SINCE 1996    Pneumonia    hx of 3 years ago   PONV (postoperative nausea and vomiting)    with tonsils as child, no problems as adult   Recurrent sinusitis    Sleep apnea    Does not use CPAP   Tuberculosis    TB + SKIN TEST, CXR was normal    Patient Active Problem List   Diagnosis Date Noted   Intractable migraine without aura and with status migrainosus 08/29/2019   Healthcare maintenance 05/03/2018   Anxiety 05/03/2018   Depression 05/03/2018   Acute post-operative pain 03/26/2017   Chronic migraine 12/22/2016   Migraine 12/22/2016   Acute medial meniscal tear 03/11/2014   Eustachian tube dysfunction 11/28/2011    Past Surgical History:  Procedure Laterality Date   ADENOIDECTOMY  1995   with tube placement   CESAREAN SECTION     2004    CHROMOPERTUBATION Bilateral 10/09/2015   Procedure: CHROMOPERTUBATION;  Surgeon: Everett Graff, MD;  Location:  [facility];  Service: Gynecology;  Laterality: Bilateral;   CYSTOSCOPY Bilateral 03/20/2017   Procedure: CYSTOSCOPY;  Surgeon: Everett Graff, MD;  Location: [facility];  Service: Gynecology;  Laterality: Bilateral;   DILITATION & CURRETTAGE/HYSTROSCOPY WITH NOVASURE ABLATION N/A 10/09/2015   Procedure: DILATATION & CURETTAGE/HYSTEROSCOPY;  Surgeon: Everett Graff, MD;  Location: [facility];  Service: Gynecology;  Laterality: N/A;   KNEE ARTHROSCOPY Left    1988    KNEE ARTHROSCOPY Left 03/12/2014   Procedure: LEFT ARTHROSCOPY KNEE WITH DEBRIDEMENT;  Surgeon: Gearlean Alf, MD;  Location: [facility];  Service: Orthopedics;  Laterality: Left;  medical and lateral repair menius   LAPAROSCOPIC OVARIAN CYSTECTOMY Right 10/09/2015   Procedure: LAPAROSCOPIC RIGHT OVARIAN CYSTECTOMIES, LEFT PARATUBAL CYSTECTOMY ;  Surgeon: Everett Graff, MD;  Location: [facility];  Service: Gynecology;  Laterality: Right;   LIGAMENT REPAIR Right 09/14/2018   Procedure: Right hallux and 2nd metatarsal phalangeal joint collateral ligament and plantar plate repair;  Surgeon: Wylene Simmer, MD;  Location: [facility];  Service: Orthopedics;  Laterality: Right;  62min   SLEEVE GASTROPLASTY     TONSILLECTOMY  1983, 1985   ADENOID X2 (SEPARATE SURGERY)  TUBE PLACEMENT   TOTAL LAPAROSCOPIC HYSTERECTOMY WITH SALPINGECTOMY Bilateral 03/20/2017  Procedure: HYSTERECTOMY TOTAL LAPAROSCOPIC WITH BILATERAL SALPINGECTOMY;  Surgeon: Everett Graff, MD;  Location: [facility];  Service: Gynecology;  Laterality: Bilateral;   TYMPANOSTOMY [employer], 2002, 2006, 2009, 2013    OB History   No obstetric history on file.      Home Medications    Prior to Admission medications   Medication Sig Start Date End Date Taking? Authorizing Provider  cholecalciferol (VITAMIN D3) 25 MCG (1000 UT) tablet Take 2,000 Units by mouth daily.    [provider]  clonazePAM (KLONOPIN) 0.5 MG tablet TAKE 1 TABLET DAILY AS NEEDED  FOR ACUTE ANXIETY. 09/28/18   Danford, Valetta Fuller D, NP  diphenhydrAMINE (BENADRYL) 25 mg capsule Take 25 mg by mouth every 6 (six) hours as needed for allergies.    [provider]  DULoxetine (CYMBALTA) 60 MG capsule 2 tabs once daily 08/07/18   Danford, Valetta Fuller D, NP  Fremanezumab-vfrm (AJOVY) 225 MG/1.5ML SOAJ Inject 225 mg into the skin every 30 (thirty) days. 11/17/20   Suzzanne Cloud, NP  methocarbamol (ROBAXIN) 500 MG tablet Take 1 tablet (500 mg total) by mouth 4 (four) times daily. Patient taking differently: Take 500 mg by mouth at bedtime as needed for muscle spasms.  03/12/14   Gaynelle Arabian, MD  Multiple Vitamin (MULTIVITAMIN) tablet Take 1 tablet by mouth daily.    [provider]  omeprazole (PRILOSEC) 20 MG capsule Take 20 mg by mouth 2 (two) times daily before a meal.  12/19/17   [provider]  ondansetron (ZOFRAN-ODT) 4 MG disintegrating tablet DISSOLVE 1 TABLET ON TONGUE EVERY 8 HOURS AS NEEDED. 08/06/20   Suzzanne Cloud, NP  predniSONE (DELTASONE) 5 MG tablet Take 6 tablets, taper by 1 tablet daily until off the medication 10/07/20   Suzzanne Cloud, NP  promethazine (PHENERGAN) 25 MG tablet TAKE ONE TABLET EVERY 6 HOURS AS NEEDED FOR NAUSEA AND VOMITING 04/05/21   Suzzanne Cloud, NP  propranolol (INDERAL) 60 MG tablet Take 1 tablet (60 mg total) by mouth daily for 15 days. 08/17/21 09/01/21  Eustaquio Maize, PA-C  Rimegepant Sulfate (NURTEC) 75 MG TBDP Take 75 mg by mouth as needed (Take 1 at onset of headache, max is 1 tablet in 24 hours). 10/07/20   Suzzanne Cloud, NP  rizatriptan (MAXALT-MLT) 10 MG disintegrating tablet Take 1 tab at onset of migraine.  May repeat in 2 hrs, if needed.  Max dose: 2 tabs/day. This is a 30 day prescription. Please call for appt. 08/06/20   Suzzanne Cloud, NP  SUMAtriptan (IMITREX STATDOSE SYSTEM) 6 MG/0.5ML SOAJ Inject 0.5 mLs into the skin as needed (use if maxalt fails). 08/06/20   Suzzanne Cloud, NP  thiamine (VITAMIN B-1) 100 MG  tablet Take 1 tablet by mouth daily.    [provider]  topiramate (TOPAMAX) 100 MG tablet Take 1 tablet (100 mg total) by mouth 2 (two) times daily. 08/06/20   Suzzanne Cloud, NP    Family History Family History  Problem Relation Age of Onset   Diabetes Father    Heart attack Father    CAD Father    Alcohol abuse Father    Stroke Mother    Diabetes Paternal Grandfather    Heart attack Paternal Grandfather    Hypertension Paternal Grandfather    Alcohol abuse Paternal Grandfather    Lung cancer Maternal Grandfather     Social History Social History   Tobacco Use   Smoking status: Never  Smokeless tobacco: Never  Vaping Use   Vaping Use: Never used  Substance Use Topics   Alcohol use: Yes    Alcohol/week: 1.0 standard drink    Types: 1 Glasses of wine per week    Comment: social   Drug use: No     Allergies   Tape, Aspirin, Biaxin [clarithromycin], Nsaids, and Penicillins   Review of Systems Review of Systems   Physical Exam Triage Vital Signs ED Triage Vitals  Enc Vitals Group     BP 08/16/21 2005 (!) 157/107     Pulse Rate 08/16/21 2005 (!) 103     Resp 08/16/21 2005 18     Temp 08/16/21 2005 98.8 F (37.1 C)     Temp Source 08/16/21 2005 Oral     SpO2 08/16/21 2005 100 %     Weight --      Height --      Head Circumference --      Peak Flow --      Pain Score 08/16/21 2015 0     Pain Loc --      Pain Edu? --      Excl. in [place]? --    No data found.  Updated Vital Signs BP (!) 157/107 (BP Location: Left Arm)   Pulse (!) 103   Temp 98.8 F (37.1 C) (Oral)   Resp 18   LMP  (LMP Unknown)   SpO2 100%   Visual Acuity Right Eye Distance:   Left Eye Distance:   Bilateral Distance:    Right Eye Near:   Left Eye Near:    Bilateral Near:     Physical Exam   UC Treatments / Results  Labs (all labs ordered are listed, but only abnormal results are displayed) Labs Reviewed - No data to display  EKG   Radiology DG Chest 2  View  Result Date: 08/16/2021 CLINICAL DATA:  Chest pain and shortness of breath EXAM: CHEST - 2 VIEW COMPARISON:  09/19/2016 FINDINGS: The heart size and mediastinal contours are within normal limits. Both lungs are clear. The visualized skeletal structures are unremarkable. IMPRESSION: No active cardiopulmonary disease. Electronically Signed   By: Inez Catalina M.D.   On: 08/16/2021 21:41   CT Head Wo Contrast  Result Date: 08/17/2021 CLINICAL DATA:  Dizziness EXAM: CT HEAD WITHOUT CONTRAST TECHNIQUE: Contiguous axial images were obtained from the base of the skull through the vertex without intravenous contrast. COMPARISON:  None. FINDINGS: Brain: No acute intracranial abnormality. Specifically, no hemorrhage, hydrocephalus, mass lesion, acute infarction, or significant intracranial injury. Vascular: No hyperdense vessel or unexpected calcification. Skull: No acute calvarial abnormality. Sinuses/Orbits: No acute findings Other: None IMPRESSION: Normal study. Electronically Signed   By: Rolm Baptise M.D.   On: 08/17/2021 09:23    Procedures Procedures (including critical care time)  Medications Ordered in UC Medications - No data to display  Initial Impression / Assessment and Plan / UC Course  I have reviewed the triage vital signs and the nursing notes.  Pertinent labs & imaging results that were available during my care of the patient were reviewed by me and considered in my medical decision making (see chart for details).  Elevated blood pressure reading without diagnosis of hypertension Nausea and vomiting Shortness of breath  Patient advised to go to the nearest emergency department for further evaluation for hypertensive urgency, does not have cardiac history but has elevated blood pressure in triage at 157/107, endorses blood pressure peaked in the 170s while  at work.  Patient in agreement with plan of care will be escorted by family member.   Final Clinical Impressions(s) / UC  Diagnoses   Final diagnoses:  None   Discharge Instructions   None    ED Prescriptions   None    PDMP not reviewed this encounter.   Hans Eden, NP 08/17/21 1306

## 2021-08-17 NOTE — ED Notes (Signed)
Pt verbalized understanding of d/c instructions, meds and followup care. Denies questions. VSS, no distress noted. W/C to exit with all belongings.  

## 2021-08-17 NOTE — Discharge Instructions (Signed)
Your workup was overall reassuring in the ED today.   Please follow up with both your PCP and neurologist for further evaluation  Take the propranolol as prescribed  Return to the ED for any new/worsening symptoms

## 2021-08-17 NOTE — ED Provider Notes (Signed)
[facility] EMERGENCY DEPARTMENT Provider Note   CSN: [id number] Arrival date & time: 08/16/21  2047     History Chief Complaint  Patient presents with   SOB/Emesis /Hypertension     Tina Hunt is a 45 y.o. female with PMHx anxiety who presents to the ED today from UC with complaint of HTN. Pt reports that yesterday morning she woke up feeling fine. She rushed to get to work however when she got to work she noticed that she felt jittery. She also reports an episode of shortness of breath that lasted a couple of minutes. They checked her blood pressure at work and reports that it was elevated. Pt reports never having an issue with high blood pressure in the past. She does mention being off of Propranolol 60 mg daily for the past 1 month (she takes it for migraine headaches). She complains of nausea, multiple episodes of emesis, and a slight headache. Pt states that her headache does not feel similar to previous migraine headaches as she has not had an aura or photosensitivity which she normally does. Denies worst headache of life or thunderclap onset. She went to [facility] for her elevated blood pressure and was sent here for further eval. She mentions while being in the waiting room that her blood pressure was checked several times and it was always elevated causing concern. She states that she thought about leaving and going home however at one point her blood pressure was in the 200's prompting her to stay. She had a couple of additional episodes of shortness of breath while in the waiting room as well. Pt denies blurry vision, double vision, chest pain, speech changes, unilateral weakness or numbness, or any other associated symptoms. PSHx of hysterectomy.    The history is provided by the patient and medical records.      Past Medical History:  Diagnosis Date   Anxiety    Arthritis    "a little in Right knee"   Chronic back pain    Depression    Endometriosis     GERD (gastroesophageal reflux disease)    Hallux varus (acquired), right foot    Headache(784.0)    MIGRAINES    History of blood transfusion 01/10/2003   [place] - 3 units transfused   Migraine    Neck pain    clench teeth at night   Neuromuscular disorder ([place])    BLACKOUT SPELLS  BUT NONE SINCE 1996    Pneumonia    hx of 3 years ago   PONV (postoperative nausea and vomiting)    with tonsils as child, no problems as adult   Recurrent sinusitis    Sleep apnea    Does not use CPAP   Tuberculosis    TB + SKIN TEST, CXR was normal    Patient Active Problem List   Diagnosis Date Noted   Intractable migraine without aura and with status migrainosus 08/29/2019   Healthcare maintenance 05/03/2018   Anxiety 05/03/2018   Depression 05/03/2018   Acute post-operative pain 03/26/2017   Chronic migraine 12/22/2016   Migraine 12/22/2016   Acute medial meniscal tear 03/11/2014   Eustachian tube dysfunction 11/28/2011    Past Surgical History:  Procedure Laterality Date   ADENOIDECTOMY  1995   with tube placement   CESAREAN SECTION     2004    CHROMOPERTUBATION Bilateral 10/09/2015   Procedure: CHROMOPERTUBATION;  Surgeon: Everett Graff, MD;  Location: [facility];  Service: Gynecology;  Laterality: Bilateral;  CYSTOSCOPY Bilateral 03/20/2017   Procedure: CYSTOSCOPY;  Surgeon: Everett Graff, MD;  Location: [facility];  Service: Gynecology;  Laterality: Bilateral;   DILITATION & CURRETTAGE/HYSTROSCOPY WITH NOVASURE ABLATION N/A 10/09/2015   Procedure: DILATATION & CURETTAGE/HYSTEROSCOPY;  Surgeon: Everett Graff, MD;  Location: [facility];  Service: Gynecology;  Laterality: N/A;   KNEE ARTHROSCOPY Left    1988    KNEE ARTHROSCOPY Left 03/12/2014   Procedure: LEFT ARTHROSCOPY KNEE WITH DEBRIDEMENT;  Surgeon: Gearlean Alf, MD;  Location: [facility];  Service: Orthopedics;  Laterality: Left;  medical and lateral repair menius   LAPAROSCOPIC OVARIAN CYSTECTOMY Right 10/09/2015   Procedure: LAPAROSCOPIC  RIGHT OVARIAN CYSTECTOMIES, LEFT PARATUBAL CYSTECTOMY ;  Surgeon: Everett Graff, MD;  Location: [facility];  Service: Gynecology;  Laterality: Right;   LIGAMENT REPAIR Right 09/14/2018   Procedure: Right hallux and 2nd metatarsal phalangeal joint collateral ligament and plantar plate repair;  Surgeon: Wylene Simmer, MD;  Location: [facility];  Service: Orthopedics;  Laterality: Right;  77min   SLEEVE GASTROPLASTY     TONSILLECTOMY  1983, 1985   ADENOID X2 (SEPARATE SURGERY)  TUBE PLACEMENT   TOTAL LAPAROSCOPIC HYSTERECTOMY WITH SALPINGECTOMY Bilateral 03/20/2017   Procedure: HYSTERECTOMY TOTAL LAPAROSCOPIC WITH BILATERAL SALPINGECTOMY;  Surgeon: Everett Graff, MD;  Location: [facility];  Service: Gynecology;  Laterality: Bilateral;   TYMPANOSTOMY [place], 2002, 2006, 2009, 2013     OB History   No obstetric history on file.     Family History  Problem Relation Age of Onset   Diabetes Father    Heart attack Father    CAD Father    Alcohol abuse Father    Stroke Mother    Diabetes Paternal Grandfather    Heart attack Paternal Grandfather    Hypertension Paternal Grandfather    Alcohol abuse Paternal Grandfather    Lung cancer Maternal Grandfather     Social History   Tobacco Use   Smoking status: Never   Smokeless tobacco: Never  Vaping Use   Vaping Use: Never used  Substance Use Topics   Alcohol use: Yes    Alcohol/week: 1.0 standard drink    Types: 1 Glasses of wine per week    Comment: social   Drug use: No    Home Medications Prior to Admission medications   Medication Sig Start Date End Date Taking? Authorizing Provider  cholecalciferol (VITAMIN D3) 25 MCG (1000 UT) tablet Take 2,000 Units by mouth daily.    [provider]  clonazePAM (KLONOPIN) 0.5 MG tablet TAKE 1 TABLET DAILY AS NEEDED FOR ACUTE ANXIETY. 09/28/18   Danford, Valetta Fuller D, NP  diphenhydrAMINE (BENADRYL) 25 mg capsule Take 25 mg by mouth every 6 (six) hours as  needed for allergies.    [provider]  DULoxetine (CYMBALTA) 60 MG capsule 2 tabs once daily 08/07/18   Danford, Valetta Fuller D, NP  Fremanezumab-vfrm (AJOVY) 225 MG/1.5ML SOAJ Inject 225 mg into the skin every 30 (thirty) days. 11/17/20   Suzzanne Cloud, NP  methocarbamol (ROBAXIN) 500 MG tablet Take 1 tablet (500 mg total) by mouth 4 (four) times daily. Patient taking differently: Take 500 mg by mouth at bedtime as needed for muscle spasms.  03/12/14   Gaynelle Arabian, MD  Multiple Vitamin (MULTIVITAMIN) tablet Take 1 tablet by mouth daily.    [provider]  omeprazole (PRILOSEC) 20 MG capsule Take 20 mg by mouth 2 (two) times daily before a meal.  12/19/17   [provider]  ondansetron (ZOFRAN-ODT) 4 MG disintegrating tablet DISSOLVE 1 TABLET ON TONGUE EVERY 8 HOURS AS NEEDED. 08/06/20   Suzzanne Cloud, NP  predniSONE (DELTASONE) 5 MG tablet Take 6 tablets, taper by 1 tablet daily until off the medication 10/07/20   Suzzanne Cloud, NP  promethazine (PHENERGAN) 25 MG tablet TAKE ONE TABLET EVERY 6 HOURS AS NEEDED FOR NAUSEA AND VOMITING 04/05/21   Suzzanne Cloud, NP  propranolol (INDERAL) 60 MG tablet Take 1 tablet (60 mg total) by mouth daily for 15 days. 08/17/21 09/01/21  Eustaquio Maize, PA-C  Rimegepant Sulfate (NURTEC) 75 MG TBDP Take 75 mg by mouth as needed (Take 1 at onset of headache, max is 1 tablet in 24 hours). 10/07/20   Suzzanne Cloud, NP  rizatriptan (MAXALT-MLT) 10 MG disintegrating tablet Take 1 tab at onset of migraine.  May repeat in 2 hrs, if needed.  Max dose: 2 tabs/day. This is a 30 day prescription. Please call for appt. 08/06/20   Suzzanne Cloud, NP  SUMAtriptan (IMITREX STATDOSE SYSTEM) 6 MG/0.5ML SOAJ Inject 0.5 mLs into the skin as needed (use if maxalt fails). 08/06/20   Suzzanne Cloud, NP  thiamine (VITAMIN B-1) 100 MG tablet Take 1 tablet by mouth daily.    [provider]  topiramate (TOPAMAX) 100 MG tablet Take 1 tablet (100 mg total) by  mouth 2 (two) times daily. 08/06/20   Suzzanne Cloud, NP    Allergies    Tape, Aspirin, Biaxin [clarithromycin], Nsaids, and Penicillins  Review of Systems   Review of Systems  Constitutional:  Negative for chills and fever.  Eyes:  Negative for photophobia and visual disturbance.  Respiratory:  Positive for shortness of breath.   Cardiovascular:  Negative for chest pain, palpitations and leg swelling.  Gastrointestinal:  Positive for nausea and vomiting. Negative for abdominal pain and diarrhea.  Neurological:  Positive for dizziness, light-headedness and headaches.  All other systems reviewed and are negative.  Physical Exam Updated Vital Signs BP (!) 143/99 (BP Location: Left Arm)   Pulse 81   Temp 98 F (36.7 C) (Oral)   Resp 13   Ht 5\' 1"  (1.549 m)   Wt (!) 140 kg   LMP  (LMP Unknown)   SpO2 100%   BMI 58.32 kg/m   Physical Exam Vitals and nursing note reviewed.  Constitutional:      Appearance: She is obese. She is not ill-appearing or diaphoretic.  HENT:     Head: Normocephalic and atraumatic.  Eyes:     Extraocular Movements: Extraocular movements intact.     Conjunctiva/sclera: Conjunctivae normal.     Pupils: Pupils are equal, round, and reactive to light.  Cardiovascular:     Rate and Rhythm: Normal rate and regular rhythm.     Pulses: Normal pulses.  Pulmonary:     Effort: Pulmonary effort is normal.     Breath sounds: Normal breath sounds. No wheezing, rhonchi or rales.  Abdominal:     Palpations: Abdomen is soft.     Tenderness: There is no abdominal tenderness. There is no guarding or rebound.  Musculoskeletal:     Cervical back: Neck supple.     Right lower leg: No edema.     Left lower leg: No edema.  Skin:    General: Skin is warm and dry.  Neurological:     Mental Status: She is alert.     Comments: Alert and oriented to self, place, time and event.  Speech is fluent, clear without dysarthria or dysphasia.   Strength 5/5 in upper/lower  extremities   Sensation intact in upper/lower extremities   Normal gait.  Negative Romberg. No pronator drift.  Normal finger-to-nose and feet tapping.  CN I not tested  CN II grossly intact visual fields bilaterally. Did not visualize posterior eye.  CN III, IV, VI PERRLA and EOMs intact bilaterally  CN V Intact sensation to sharp and light touch to the face  CN VII facial movements symmetric  CN VIII not tested  CN IX, X no uvula deviation, symmetric rise of soft palate  CN XI 5/5 SCM and trapezius strength bilaterally  CN XII Midline tongue protrusion, symmetric L/R movements      ED Results / Procedures / Treatments   Labs (all labs ordered are listed, but only abnormal results are displayed) Labs Reviewed  BASIC METABOLIC PANEL - Abnormal; Notable for the following components:      Result Value   Glucose, Bld 114 (*)    All other components within normal limits  RESP PANEL BY RT-PCR (FLU A&B, COVID) ARPGX2  CBC WITH DIFFERENTIAL/PLATELET  BRAIN NATRIURETIC PEPTIDE  D-DIMER, QUANTITATIVE  TSH  CBG MONITORING, ED  TROPONIN I (HIGH SENSITIVITY)    EKG None  Radiology DG Chest 2 View  Result Date: 08/16/2021 CLINICAL DATA:  Chest pain and shortness of breath EXAM: CHEST - 2 VIEW COMPARISON:  09/19/2016 FINDINGS: The heart size and mediastinal contours are within normal limits. Both lungs are clear. The visualized skeletal structures are unremarkable. IMPRESSION: No active cardiopulmonary disease. Electronically Signed   By: Inez Catalina M.D.   On: 08/16/2021 21:41   CT Head Wo Contrast  Result Date: 08/17/2021 CLINICAL DATA:  Dizziness EXAM: CT HEAD WITHOUT CONTRAST TECHNIQUE: Contiguous axial images were obtained from the base of the skull through the vertex without intravenous contrast. COMPARISON:  None. FINDINGS: Brain: No acute intracranial abnormality. Specifically, no hemorrhage, hydrocephalus, mass lesion, acute infarction, or significant intracranial injury.  Vascular: No hyperdense vessel or unexpected calcification. Skull: No acute calvarial abnormality. Sinuses/Orbits: No acute findings Other: None IMPRESSION: Normal study. Electronically Signed   By: Rolm Baptise M.D.   On: 08/17/2021 09:23    Procedures Procedures   Medications Ordered in ED Medications  propranolol (INDERAL) tablet 60 mg (60 mg Oral Given 08/17/21 1028)  ketorolac (TORADOL) 15 MG/ML injection 15 mg (15 mg Intravenous Given 08/17/21 1031)  metoCLOPramide (REGLAN) injection 10 mg (10 mg Intravenous Given 08/17/21 1031)  diphenhydrAMINE (BENADRYL) injection 50 mg (50 mg Intravenous Given 08/17/21 1031)  sodium chloride 0.9 % bolus 1,000 mL (1,000 mLs Intravenous New Bag/Given 08/17/21 1030)    ED Course  I have reviewed the triage vital signs and the nursing notes.  Pertinent labs & imaging results that were available during my care of the patient were reviewed by me and considered in my medical decision making (see chart for details).    MDM Rules/Calculators/A&P                           45 year old female who presents to the ED today after experiencing complains of jitteriness/lightheadedness/dizziness with associated mild headache, nausea, vomiting, multiple episodes of shortness of breath without chest pain.  She was sent from urgent care for further evaluation.  On arrival to the ED she is noted to be hypertensive without history of same.  She was medically screened and work-up started including CBC, BMP, BNP, EKG,  chest x-ray which have all returned without acute findings at this time.  On my exam she continues to have slightly elevated blood pressures with most recent 154/95.  She does mention being off of propranolol 60 mg daily for her migraine headaches.  She states that her headache feels different than previous migraine headaches however denies any thunderclap onset or worst headache of life to suggest [place].  She has no focal neurodeficits on exam today.  She does  appear mildly anxious and does have a history of anxiety which could be contributing to her symptoms however given complaints of headache and elevated blood pressure we will plan for CT head for further evaluation. Question headache related to elevated BP? Less suspicious for ICH given reassuring neuro examination. Will add on troponin, D-dimer, TSH with complaints of SOB.  Pt mentions recently having her TSH checked however has not heard of her results yet and cannot see this in Care Everywhere or chart review.  Will provide propranolol at this time to see if this does not help patient's symptoms/HTN.  If CT head negative may treat with headache cocktail as well.   Vitals:   08/16/21 2057 08/16/21 2248 08/17/21 0207 08/17/21 0426  BP: (!) 161/108 134/89 (!) 151/101 (!) 167/99   08/17/21 0636 08/17/21 0800 08/17/21 0900 08/17/21 1015  BP: (!) 143/99 (!) 154/95 130/80 130/81    I, Caedin Mogan [employer], personally reviewed and evaluated these images and lab results as part of my medical decision-making.  EKG without acute ischemic changes CBC without leukocytosis. Hgb stable at 14.5.  BMP with glucose 114. No other electrolyte abnormalities.  BNP WNL at 19.5 CXR clear D dimer negative at 0.40; low well's score. Do not feel pt requires CTA at this time  CT head negative Troponin of 5. Low suspicion for ACS. Do not feel pt requires repeat troponin testing without complaint of CP/low heart score TSH 1.431  Blood pressure recently 130/80. On reeval after pt's usual dose of propranolol and headache cocktail pt resting comfortably. Reports improvement in her symptoms; no longer having headache. Blood pressure continues to be normotensive. Will plan to discharge home at this time. Will provide short course of pt's previous propranolol Rx however advised to follow up with neurology for refill. She is in agreement with plan at this time and stable for discharge home.   This note was prepared using Dragon  voice recognition software and may include unintentional dictation errors due to the inherent limitations of voice recognition software.  Final Clinical Impression(s) / ED Diagnoses Final diagnoses:  Hypertension, unspecified type  Lightheadedness  Nausea and vomiting, unspecified vomiting type    Rx / DC Orders ED Discharge Orders          Ordered    propranolol (INDERAL) 60 MG tablet  Daily        08/17/21 1105             Discharge Instructions      Your workup was overall reassuring in the ED today.   Please follow up with both your PCP and neurologist for further evaluation  Take the propranolol as prescribed  Return to the ED for any new/worsening symptoms       Eustaquio Maize, PA-C 08/17/21 [place], MD 08/17/21 1116

## 2021-08-17 NOTE — ED Notes (Signed)
Hooked patient back up to the monitor patient is resting with call bell in reach 

## 2021-09-28 ENCOUNTER — Telehealth: Payer: Self-pay | Admitting: Neurology

## 2021-09-28 MED ORDER — TOPIRAMATE 100 MG PO TABS: 100.0000 mg | ORAL_TABLET | Freq: Two times a day (BID) | ORAL | 0 refills | Status: DC

## 2021-09-28 MED ORDER — ONDANSETRON 4 MG PO TBDP: ORAL_TABLET | ORAL | 0 refills | Status: DC

## 2021-09-28 NOTE — Telephone Encounter (Signed)
Pt requesting refill for topiramate (TOPAMAX) 100 MG tablet, propranolol (INDERAL) 60 MG tablet, & ondansetron (ZOFRAN-ODT) 4 MG disintegrating tablet. Tina Hunt, Tina Hunt

## 2021-09-30 ENCOUNTER — Other Ambulatory Visit: Payer: Self-pay

## 2021-09-30 ENCOUNTER — Encounter (HOSPITAL_BASED_OUTPATIENT_CLINIC_OR_DEPARTMENT_OTHER): Payer: Self-pay | Admitting: Obstetrics and Gynecology

## 2021-09-30 DIAGNOSIS — G43109 Migraine with aura, not intractable, without status migrainosus: Secondary | ICD-10-CM | POA: Diagnosis not present

## 2021-09-30 DIAGNOSIS — R110 Nausea: Secondary | ICD-10-CM | POA: Diagnosis present

## 2021-09-30 DIAGNOSIS — Z5321 Procedure and treatment not carried out due to patient leaving prior to being seen by health care provider: Secondary | ICD-10-CM | POA: Insufficient documentation

## 2021-09-30 LAB — CBC
HCT: 35.7 % — ABNORMAL LOW (ref 36.0–46.0)
Hemoglobin: 12.1 g/dL (ref 12.0–15.0)
MCH: 32.9 pg (ref 26.0–34.0)
MCHC: 33.9 g/dL (ref 30.0–36.0)
MCV: 97 fL (ref 80.0–100.0)
Platelets: 220 10*3/uL (ref 150–400)
RBC: 3.68 MIL/uL — ABNORMAL LOW (ref 3.87–5.11)
RDW: 12.7 % (ref 11.5–15.5)
WBC: 8.9 10*3/uL (ref 4.0–10.5)
nRBC: 0 % (ref 0.0–0.2)

## 2021-09-30 LAB — BASIC METABOLIC PANEL
Anion gap: 9 (ref 5–15)
BUN: 14 mg/dL (ref 6–20)
CO2: 22 mmol/L (ref 22–32)
Calcium: 9 mg/dL (ref 8.9–10.3)
Chloride: 106 mmol/L (ref 98–111)
Creatinine, Ser: 0.73 mg/dL (ref 0.44–1.00)
GFR, Estimated: >60 mL/min (ref >60)
Glucose, Bld: 135 mg/dL — ABNORMAL HIGH (ref 70–99)
Potassium: 3.4 mmol/L — ABNORMAL LOW (ref 3.5–5.1)
Sodium: 137 mmol/L (ref 135–145)

## 2021-09-30 MED ORDER — PROPRANOLOL HCL 60 MG PO TABS: 60.0000 mg | ORAL_TABLET | Freq: Every day | ORAL | 0 refills | Status: DC

## 2021-09-30 MED ORDER — OXYCODONE-ACETAMINOPHEN 5-325 MG PO TABS
1.0000 | ORAL_TABLET | ORAL | Status: DC | PRN
  Administered 2021-09-30: 1 via ORAL
  Filled 2021-09-30: qty 1

## 2021-09-30 NOTE — ED Triage Notes (Signed)
Patient reports to the ER for migraine and states she has Migraines with aura. Patient reports neck stiffness and nausea with light sensitivity. Patient reports she normally takes Propanolol for her migraines but she was out of her script and she has not had it for x5 days.

## 2021-09-30 NOTE — Addendum Note (Signed)
Addended by: Andre Lefort on: 09/30/2021 04:16 PM   Modules accepted: Orders

## 2021-09-30 NOTE — Telephone Encounter (Signed)
Refills sent to pharmacy, moved up appt this month so pt can discuss medications.

## 2021-10-01 ENCOUNTER — Other Ambulatory Visit: Payer: Self-pay

## 2021-10-01 ENCOUNTER — Emergency Department (HOSPITAL_BASED_OUTPATIENT_CLINIC_OR_DEPARTMENT_OTHER)
Admission: EM | Admit: 2021-10-01 | Discharge: 2021-10-01 | Disposition: A | Discharge status: Home / Self Care | Payer: BC Managed Care – PPO | Attending: Emergency Medicine | Admitting: Emergency Medicine

## 2021-10-01 ENCOUNTER — Emergency Department (HOSPITAL_BASED_OUTPATIENT_CLINIC_OR_DEPARTMENT_OTHER)
Admission: EM | Admit: 2021-10-01 | Discharge: 2021-10-02 | Disposition: A | Discharge status: Home / Self Care | Payer: BC Managed Care – PPO | Source: Home / Self Care | Attending: Emergency Medicine | Admitting: Emergency Medicine

## 2021-10-01 ENCOUNTER — Encounter (HOSPITAL_BASED_OUTPATIENT_CLINIC_OR_DEPARTMENT_OTHER): Payer: Self-pay | Admitting: Obstetrics and Gynecology

## 2021-10-01 DIAGNOSIS — T148XXA Other injury of unspecified body region, initial encounter: Secondary | ICD-10-CM

## 2021-10-01 DIAGNOSIS — X58XXXA Exposure to other specified factors, initial encounter: Secondary | ICD-10-CM | POA: Insufficient documentation

## 2021-10-01 DIAGNOSIS — Z79899 Other long term (current) drug therapy: Secondary | ICD-10-CM | POA: Insufficient documentation

## 2021-10-01 DIAGNOSIS — S29012A Strain of muscle and tendon of back wall of thorax, initial encounter: Secondary | ICD-10-CM | POA: Insufficient documentation

## 2021-10-01 DIAGNOSIS — R519 Headache, unspecified: Secondary | ICD-10-CM | POA: Insufficient documentation

## 2021-10-01 MED ORDER — OXYCODONE-ACETAMINOPHEN 5-325 MG PO TABS
1.0000 | ORAL_TABLET | ORAL | Status: DC | PRN
  Administered 2021-10-01: 1 via ORAL
  Filled 2021-10-01: qty 1

## 2021-10-01 NOTE — ED Triage Notes (Signed)
Patient reports to the ER for migraine and emesis. Patient reports multiple emesis today and has continued to suffer from her migraine that she reported to the ER with yesterday

## 2021-10-02 MED ORDER — DEXAMETHASONE SODIUM PHOSPHATE 10 MG/ML IJ SOLN
10.0000 mg | Freq: Once | INTRAMUSCULAR | Status: AC
Start: 2021-10-02 — End: 2021-10-02
  Administered 2021-10-02: 10 mg via INTRAVENOUS
  Filled 2021-10-02: qty 1

## 2021-10-02 MED ORDER — DIPHENHYDRAMINE HCL 50 MG/ML IJ SOLN
25.0000 mg | Freq: Once | INTRAMUSCULAR | Status: AC
  Administered 2021-10-02: 25 mg via INTRAVENOUS
  Filled 2021-10-02: qty 1

## 2021-10-02 MED ORDER — KETOROLAC TROMETHAMINE 15 MG/ML IJ SOLN
15.0000 mg | Freq: Once | INTRAMUSCULAR | Status: AC
  Administered 2021-10-02: 15 mg via INTRAVENOUS
  Filled 2021-10-02: qty 1

## 2021-10-02 MED ORDER — PROCHLORPERAZINE EDISYLATE 10 MG/2ML IJ SOLN
10.0000 mg | Freq: Once | INTRAMUSCULAR | Status: AC
  Administered 2021-10-02: 10 mg via INTRAVENOUS
  Filled 2021-10-02: qty 2

## 2021-10-02 NOTE — Discharge Instructions (Signed)
You may use over-the-counter Motrin (Ibuprofen), Acetaminophen (Tylenol), topical muscle creams such as SalonPas, Icy Hot, Bengay, etc. Please stretch, apply ice or heat (whichever helps), and have massage therapy for additional assistance.  

## 2021-10-02 NOTE — ED Provider Notes (Signed)
[facility] Provider Note  CSN: [id number] Arrival date & time: 10/01/21 1828  Chief Complaint(s) Migraine  HPI Tina Hunt is a 46 y.o. female   The history is provided by the patient.  Migraine This is a recurrent problem. Episode onset: 1 week. The problem occurs constantly. The problem has not changed since onset.Associated symptoms include headaches. Pertinent negatives include no chest pain, no abdominal pain and no shortness of breath. Treatments tried: Rx.   Past Medical History Past Medical History:  Diagnosis Date   Anxiety    Arthritis    "a little in Right knee"   Chronic back pain    Depression    Endometriosis    GERD (gastroesophageal reflux disease)    Hallux varus (acquired), right foot    Headache(784.0)    MIGRAINES    History of blood transfusion 01/10/2003   [facility] - 3 units transfused   Migraine    Neck pain    clench teeth at night   Neuromuscular disorder ([place])    BLACKOUT SPELLS  BUT NONE SINCE 1996    Pneumonia    hx of 3 years ago   PONV (postoperative nausea and vomiting)    with tonsils as child, no problems as adult   Recurrent sinusitis    Sleep apnea    Does not use CPAP   Tuberculosis    TB + SKIN TEST, CXR was normal   Patient Active Problem List   Diagnosis Date Noted   Intractable migraine without aura and with status migrainosus 08/29/2019   Healthcare maintenance 05/03/2018   Anxiety 05/03/2018   Depression 05/03/2018   Acute post-operative pain 03/26/2017   Chronic migraine 12/22/2016   Migraine 12/22/2016   Acute medial meniscal tear 03/11/2014   Eustachian tube dysfunction 11/28/2011   Home Medication(s) Prior to Admission medications   Medication Sig Start Date End Date Taking? Authorizing Provider  cholecalciferol (VITAMIN D3) 25 MCG (1000 UT) tablet Take 2,000 Units by mouth daily.    [provider]  clonazePAM (KLONOPIN) 0.5 MG tablet TAKE 1 TABLET DAILY AS NEEDED FOR  ACUTE ANXIETY. 09/28/18   Danford, Valetta Fuller D, NP  diphenhydrAMINE (BENADRYL) 25 mg capsule Take 25 mg by mouth every 6 (six) hours as needed for allergies.    [provider]  DULoxetine (CYMBALTA) 60 MG capsule 2 tabs once daily 08/07/18   Danford, Valetta Fuller D, NP  Fremanezumab-vfrm (AJOVY) 225 MG/1.5ML SOAJ Inject 225 mg into the skin every 30 (thirty) days. 11/17/20   Suzzanne Cloud, NP  methocarbamol (ROBAXIN) 500 MG tablet Take 1 tablet (500 mg total) by mouth 4 (four) times daily. Patient taking differently: Take 500 mg by mouth at bedtime as needed for muscle spasms.  03/12/14   Gaynelle Arabian, MD  Multiple Vitamin (MULTIVITAMIN) tablet Take 1 tablet by mouth daily.    [provider]  omeprazole (PRILOSEC) 20 MG capsule Take 20 mg by mouth 2 (two) times daily before a meal.  12/19/17   [provider]  ondansetron (ZOFRAN-ODT) 4 MG disintegrating tablet DISSOLVE 1 TABLET ON TONGUE EVERY 8 HOURS AS NEEDED. 09/28/21   Suzzanne Cloud, NP  predniSONE (DELTASONE) 5 MG tablet Take 6 tablets, taper by 1 tablet daily until off the medication 10/07/20   Suzzanne Cloud, NP  promethazine (PHENERGAN) 25 MG tablet TAKE ONE TABLET EVERY 6 HOURS AS NEEDED FOR NAUSEA AND VOMITING 04/05/21   Suzzanne Cloud, NP  propranolol (INDERAL) 60 MG tablet Take  1 tablet (60 mg total) by mouth daily. Pt must be seen by Np this month for further refills 09/30/21   Suzzanne Cloud, NP  Rimegepant Sulfate (NURTEC) 75 MG TBDP Take 75 mg by mouth as needed (Take 1 at onset of headache, max is 1 tablet in 24 hours). 10/07/20   Suzzanne Cloud, NP  rizatriptan (MAXALT-MLT) 10 MG disintegrating tablet Take 1 tab at onset of migraine.  May repeat in 2 hrs, if needed.  Max dose: 2 tabs/day. This is a 30 day prescription. Please call for appt. 08/06/20   Suzzanne Cloud, NP  SUMAtriptan (IMITREX STATDOSE SYSTEM) 6 MG/0.5ML SOAJ Inject 0.5 mLs into the skin as needed (use if maxalt fails). 08/06/20   Suzzanne Cloud, NP  thiamine  (VITAMIN B-1) 100 MG tablet Take 1 tablet by mouth daily.    [provider]  topiramate (TOPAMAX) 100 MG tablet Take 1 tablet (100 mg total) by mouth 2 (two) times daily. 09/28/21   Suzzanne Cloud, NP                                                                                                                                    Allergies Tape, Aspirin, Biaxin [clarithromycin], Nsaids, and Penicillins  Review of Systems Review of Systems  Respiratory:  Negative for shortness of breath.   Cardiovascular:  Negative for chest pain.  Gastrointestinal:  Negative for abdominal pain.  Neurological:  Positive for headaches.  As noted in HPI  Physical Exam Vital Signs  I have reviewed the triage vital signs BP 107/73    Pulse (!) 50    Temp 98.4 F (36.9 C) (Oral)    Resp 16    LMP  (LMP Unknown)    SpO2 99%   Physical Exam Vitals reviewed.  Constitutional:      General: She is not in acute distress.    Appearance: She is well-developed. She is not diaphoretic.  HENT:     Head: Normocephalic and atraumatic.     Right Ear: External ear normal.     Left Ear: External ear normal.     Nose: Nose normal.  Eyes:     General: No scleral icterus.    Conjunctiva/sclera: Conjunctivae normal.  Neck:     Trachea: Phonation normal.  Cardiovascular:     Rate and Rhythm: Normal rate and regular rhythm.  Pulmonary:     Effort: Pulmonary effort is normal. No respiratory distress.     Breath sounds: No stridor.  Abdominal:     General: There is no distension.  Musculoskeletal:        General: Normal range of motion.     Cervical back: Normal range of motion. Spasms and tenderness present. No bony tenderness.     Thoracic back: Spasms and tenderness present. No bony tenderness.       Back:  Neurological:     Mental Status:  She is alert and oriented to person, place, and time.     Sensory: Sensation is intact.     Motor: Motor function is intact.  Psychiatric:        Behavior:  Behavior normal.    ED Results and Treatments Labs (all labs ordered are listed, but only abnormal results are displayed) Labs Reviewed - No data to display                                                                                                                       EKG  EKG Interpretation  Date/Time:  Friday October 01 2021 18:42:19 EST Ventricular Rate:  59 PR Interval:  170 QRS Duration: 82 QT Interval:  424 QTC Calculation: 419 R Axis:   27 Text Interpretation: Sinus bradycardia Cannot rule out Anterior infarct , age undetermined Abnormal ECG When compared with ECG of 30-Sep-2021 20:32, No significant change was found Confirmed by Dorie Rank [phone]) on 10/01/2021 6:45:55 PM       Radiology No results found.  Pertinent labs & imaging results that were available during my care of the patient were reviewed by me and considered in my medical decision making (see MDM for details).  Medications Ordered in ED Medications  oxyCODONE-acetaminophen (PERCOCET/ROXICET) 5-325 MG per tablet 1 tablet (1 tablet Oral Given 10/01/21 1851)  diphenhydrAMINE (BENADRYL) injection 25 mg (25 mg Intravenous Given 10/02/21 0040)  dexamethasone (DECADRON) injection 10 mg (10 mg Intravenous Given 10/02/21 0040)  prochlorperazine (COMPAZINE) injection 10 mg (10 mg Intravenous Given 10/02/21 0040)  ketorolac (TORADOL) 15 MG/ML injection 15 mg (15 mg Intravenous Given 10/02/21 0040)                                                                                                                                     Procedures Procedures  (including critical care time)  Medical Decision Making / ED Course     Typical migraine headache for the pt, but feel that her upper back muscle strain is a contributing factor. Non focal neuro exam. No recent head trauma. No fever. Doubt meningitis. Doubt intracranial bleed. Doubt IIH. No indication for imaging.   Will treat with migraine cocktail and  reevaluate.  4:12 AM Completely resolved headache.  Final Clinical Impression(s) / ED Diagnoses Final diagnoses:  Left-sided headache  Muscle strain  The patient appears reasonably screened and/or stabilized for discharge and I doubt any  other medical condition or other [facility] requiring further screening, evaluation, or treatment in the ED at this time prior to discharge. Safe for discharge with strict return precautions.  Disposition: Discharge  Condition: Good  I have discussed the results, Dx and Tx plan with the patient/family who expressed understanding and agree(s) with the plan. Discharge instructions discussed at length. The patient/family was given strict return precautions who verbalized understanding of the instructions. No further questions at time of discharge.    ED Discharge Orders     None       Follow Up: Harlan Stains, [place] [address] [phone]  Call              This chart was dictated using voice recognition software.  Despite best efforts to proofread,  errors can occur which can change the documentation meaning.    Fatima Blank, MD 10/02/21 [phone]

## 2021-10-13 ENCOUNTER — Telehealth: Payer: Self-pay | Admitting: Neurology

## 2021-10-13 ENCOUNTER — Telehealth (INDEPENDENT_AMBULATORY_CARE_PROVIDER_SITE_OTHER): Payer: BC Managed Care – PPO | Admitting: Neurology

## 2021-10-13 DIAGNOSIS — G43709 Chronic migraine without aura, not intractable, without status migrainosus: Secondary | ICD-10-CM | POA: Diagnosis not present

## 2021-10-13 MED ORDER — SUMATRIPTAN SUCCINATE 6 MG/0.5ML ~~LOC~~ SOAJ: 0.5000 mL | SUBCUTANEOUS | 11 refills | Status: DC | PRN

## 2021-10-13 MED ORDER — NURTEC 75 MG PO TBDP: 75.0000 mg | ORAL_TABLET | ORAL | 11 refills | Status: DC | PRN

## 2021-10-13 MED ORDER — TOPIRAMATE 100 MG PO TABS: 100.0000 mg | ORAL_TABLET | Freq: Two times a day (BID) | ORAL | 1 refills | Status: DC

## 2021-10-13 MED ORDER — AJOVY 225 MG/1.5ML ~~LOC~~ SOAJ: 225.0000 mg | SUBCUTANEOUS | 11 refills | Status: DC

## 2021-10-13 MED ORDER — PROMETHAZINE HCL 25 MG PO TABS: ORAL_TABLET | ORAL | 1 refills | Status: DC
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
## 2021-10-13 NOTE — Telephone Encounter (Signed)
Contacted pt to reminder of MyChart Video Visit today. Pt verbalized understand will get on at 3:45p

## 2021-10-27 ENCOUNTER — Other Ambulatory Visit: Payer: Self-pay | Admitting: Neurology

## 2021-10-28 NOTE — Telephone Encounter (Signed)
Rx refilled.

## 2021-11-24 ENCOUNTER — Ambulatory Visit: Payer: BC Managed Care – PPO | Admitting: Adult Health

## 2021-12-28 ENCOUNTER — Other Ambulatory Visit: Payer: Self-pay | Admitting: Neurology

## 2021-12-28 NOTE — Telephone Encounter (Signed)
Rx's refilled. 

## 2022-01-14 ENCOUNTER — Emergency Department
Admission: EM | Admit: 2022-01-14 | Discharge: 2022-01-14 | Disposition: A | Discharge status: Home / Self Care | Payer: BC Managed Care – PPO | Attending: Emergency Medicine | Admitting: Emergency Medicine

## 2022-01-14 ENCOUNTER — Other Ambulatory Visit: Payer: Self-pay

## 2022-01-14 DIAGNOSIS — G43101 Migraine with aura, not intractable, with status migrainosus: Secondary | ICD-10-CM | POA: Insufficient documentation

## 2022-01-14 DIAGNOSIS — R519 Headache, unspecified: Secondary | ICD-10-CM | POA: Diagnosis present

## 2022-01-14 MED ORDER — METOCLOPRAMIDE HCL 5 MG/ML IJ SOLN
10.0000 mg | Freq: Once | INTRAMUSCULAR | Status: AC
Start: 2022-01-14 — End: 2022-01-14
  Administered 2022-01-14: 10 mg via INTRAVENOUS
  Filled 2022-01-14: qty 2

## 2022-01-14 MED ORDER — DIPHENHYDRAMINE HCL 50 MG/ML IJ SOLN
25.0000 mg | Freq: Once | INTRAMUSCULAR | Status: AC
  Administered 2022-01-14: 25 mg via INTRAVENOUS
  Filled 2022-01-14: qty 1

## 2022-01-14 MED ORDER — DEXAMETHASONE SODIUM PHOSPHATE 10 MG/ML IJ SOLN
10.0000 mg | Freq: Once | INTRAMUSCULAR | Status: AC
  Administered 2022-01-14: 10 mg via INTRAVENOUS
  Filled 2022-01-14: qty 1

## 2022-01-14 MED ORDER — KETOROLAC TROMETHAMINE 30 MG/ML IJ SOLN
30.0000 mg | Freq: Once | INTRAMUSCULAR | Status: AC
  Administered 2022-01-14: 30 mg via INTRAVENOUS
  Filled 2022-01-14: qty 1

## 2022-01-14 MED ORDER — SODIUM CHLORIDE 0.9 % IV BOLUS
1000.0000 mL | Freq: Once | INTRAVENOUS | Status: AC
  Administered 2022-01-14: 1000 mL via INTRAVENOUS

## 2022-01-14 NOTE — ED Notes (Signed)
Pt to ED for L sided migraine, burning L side pain from neck to front of head, has been using all prescribed meds for migraine including benadryl, phanergan, imitrex. Wearign sunglasses, photosensitive. Provider at bedside. ?

## 2022-01-14 NOTE — ED Provider Notes (Signed)
? ?[facility] ?Provider Note ? ? Event Date/Time  ? First MD Initiated Contact with Patient 01/14/22 1035   ?  (approximate) ?History  ?Migraine ? ?HPI ?Tina Hunt is a 46 y.o. female with a stated past medical history of migraines who presents for persistent migraine symptoms the past 4 days.  Patient states that she has tried home relief medicines including Excedrin, Nurtec, Benadryl, and fluid resuscitation with little improvement of her symptoms.  Patient called her neurologist who told her to present to the emergency department as they do not do migraine infusions in their office anymore.  Patient endorses photophobia, phonophobia, nausea, and decreased p.o. intake given gastric bypass surgery.  Patient describes a headache that is 8/10 in severity over the left aspect of her head, behind her eye and down into the left aspect of the cervical paraspinal musculature.  Patient currently denies any vision changes, tinnitus, difficulty speaking, facial droop, sore throat, chest pain, shortness of breath, abdominal pain, vomiting/diarrhea, dysuria, or weakness/numbness/paresthesias in any extremity ?Physical Exam  ?Triage Vital Signs: ?ED Triage Vitals [01/14/22 1012]  ?Enc Vitals Group  ?   BP 132/88  ?   Pulse Rate 64  ?   Resp 17  ?   Temp 98.3 ?F (36.8 ?C)  ?   Temp Source Oral  ?   SpO2 98 %  ?   Weight   ?   Height   ?   Head Circumference   ?   Peak Flow   ?   Pain Score   ?   Pain Loc   ?   Pain Edu?   ?   Excl. in [place]?   ? ?Most recent vital signs: ?Vitals:  ? 01/14/22 1012  ?BP: 132/88  ?Pulse: 64  ?Resp: 17  ?Temp: 98.3 ?F (36.8 ?C)  ?SpO2: 98%  ? ?General: Awake, oriented x4. ?CV:  Good peripheral perfusion.  ?Resp:  Normal effort.  ?Abd:  No distention.  ?Other:  Middle-aged obese Caucasian female sitting in chair in room in no distress with sunglasses on. ?ED Results / Procedures / Treatments  ?PROCEDURES: ?Critical Care performed: No ?Procedures ?MEDICATIONS ORDERED IN  ED: ?Medications  ?sodium chloride 0.9 % bolus 1,000 mL (has no administration in time range)  ?ketorolac (TORADOL) 30 MG/ML injection 30 mg (has no administration in time range)  ?metoCLOPramide (REGLAN) injection 10 mg (has no administration in time range)  ?diphenhydrAMINE (BENADRYL) injection 25 mg (has no administration in time range)  ?dexamethasone (DECADRON) injection 10 mg (has no administration in time range)  ? ?IMPRESSION / MDM / ASSESSMENT AND PLAN / ED COURSE  ?I reviewed the triage vital signs and the nursing notes. ?             ?               ?Presents with Headache. ? ?No focal neurological symptoms. Neuro exam is benign. Pt is nontoxic. VSS. ? ?Based on history and normal neurological exam I have low suspicion for intracranial tumor, intracranial bleed, meningitis, temporal arteritis, glaucoma, CO poisoning. ? ?Most likely patient has benign headache, recommend rest, hydration, and ibuprofen. ? ?Disposition: Discharge home with strict return precautions and instructions for prompt primary care follow up. ? ?  ?FINAL CLINICAL IMPRESSION(S) / ED DIAGNOSES  ? ?Final diagnoses:  ?Migraine with aura and with status migrainosus, not intractable  ? ?Rx / DC Orders  ? ?ED Discharge Orders   ? ? None  ? ?  ? ?  Note:  This document was prepared using Dragon voice recognition software and may include unintentional dictation errors. ?  ?Naaman Plummer, MD ?01/14/22 1309 ? ?

## 2022-01-14 NOTE — ED Triage Notes (Signed)
Pt c/o migraine since Wednesday, states she has taken her migraine meds with no relief, and her neurology states they dont do the migraine infusion in there office anymore and to come to the ED. Pt is in NAD on arrival with a steady gait ?

## 2022-07-20 ENCOUNTER — Encounter: Payer: Self-pay | Admitting: Neurology

## 2022-07-20 ENCOUNTER — Ambulatory Visit (INDEPENDENT_AMBULATORY_CARE_PROVIDER_SITE_OTHER): Payer: BC Managed Care – PPO | Admitting: Neurology

## 2022-07-20 VITALS — BP 142/90 | HR 52 | Ht 61.0 in | Wt 256.0 lb

## 2022-07-20 DIAGNOSIS — G43709 Chronic migraine without aura, not intractable, without status migrainosus: Secondary | ICD-10-CM

## 2022-07-20 MED ORDER — NURTEC 75 MG PO TBDP: 75.0000 mg | ORAL_TABLET | ORAL | 11 refills | Status: DC | PRN

## 2022-07-20 MED ORDER — SUMATRIPTAN SUCCINATE 6 MG/0.5ML ~~LOC~~ SOAJ: 0.5000 mL | SUBCUTANEOUS | 11 refills | Status: DC | PRN

## 2022-07-20 MED ORDER — PROMETHAZINE HCL 25 MG PO TABS: ORAL_TABLET | ORAL | 0 refills | Status: DC

## 2022-07-20 MED ORDER — AJOVY 225 MG/1.5ML ~~LOC~~ SOAJ: 225.0000 mg | SUBCUTANEOUS | 11 refills | Status: DC

## 2022-07-20 NOTE — Progress Notes (Signed)
Patient: Tina Hunt Date of Birth: 1976-03-01  Reason for Visit: Follow up History from: Patient Primary Neurologist: Krista Blue  ASSESSMENT AND PLAN 46 y.o. year old female   61.  Chronic migraine headache -Under good control, stopped all her medications -Will restart Ajovy, stay off propanolol and Topamax for migraine prevention -Continue Nurtec as needed for acute headache, Imitrex injection for severe headache, uses Phenergan for significant nausea, Zofran for nausea when she is working -Follow-up in 1 year or sooner if needed  HISTORY  Update October 13, 2021 SS: Seen in ER 10/01/21 for typical migraine, treated with migraine cocktail IV Benadryl, Decadron, Compazine, Toradol, headache completely resolved. In ER November 2022 for HTN, dizziness, had stopped propranolol, was restarted. CT head was normal.    Here today for VV, had run out of propranolol, missed a visit here. 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    Is on propranolol 60 mg daily, Topamax 100 mg, takes 2 at night, on Ajovy for prevention. Taking Nurtec for rescue, usually works fairly well, Imitrex injections works the best, saves them. Has Zofran and phenergan for nausea, phenergan is reserved for really bad headaches.  Uses Zofran when she is at work. Migraines are worse in the winter. Having 1 migraine a week. Work stress is contributing to migraines, will be looking for another job. In the past was on propranolol 60 mg twice daily.    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 Has been out of all her medications for at least 1 month, cost factor, her new insurance hadn't kicked it. Off Ajovy, topamax, propranolol. Uses Nurtec for moderate headaches, Imitrex injection for severe headaches. On average only have 1 migraine a month for last several months. Uses phenergan for bad migraine nausea, zofran when at school. Needs right hip replacement, but has to lose weight.    REVIEW OF SYSTEMS: Out of a complete 14 system review of symptoms, the patient complains only of the following symptoms, and all other reviewed systems are negative.  See HPI  ALLERGIES: Allergies  Allergen Reactions   Tape Rash   Aspirin Other (See Comments)    Stomach cramping   Biaxin [Clarithromycin] Diarrhea and Nausea And Vomiting   Nsaids Other (See Comments)    Had gastric bypass, cannot take   Penicillins Hives    Has patient had a PCN reaction causing immediate rash, facial/tongue/throat swelling, SOB or lightheadedness with hypotension: No Has patient had a PCN reaction causing severe rash involving mucus membranes or skin necrosis: No Has patient had a PCN reaction that required hospitalization No Has patient had a PCN reaction occurring within the last 10 years: No If all of the above answers are "NO", then may proceed with Cephalosporin use.     HOME MEDICATIONS: Outpatient Medications Prior to Visit  Medication Sig Dispense Refill   cholecalciferol (VITAMIN D3) 25 MCG (1000 UT) tablet Take 2,000 Units by mouth daily.     clonazePAM (KLONOPIN) 0.5 MG tablet TAKE 1 TABLET DAILY AS NEEDED FOR ACUTE ANXIETY. 30 tablet 0   diphenhydrAMINE (BENADRYL) 25 mg capsule Take 25 mg by mouth every 6 (six) hours as needed for allergies.     DULoxetine (CYMBALTA) 60 MG capsule 2 tabs once daily 60 capsule 3   methocarbamol (ROBAXIN) 500 MG tablet Take 1 tablet (500 mg total) by mouth 4 (four) times daily. (Patient taking differently: Take 500 mg by mouth at bedtime  as needed for muscle spasms.) 30 tablet 1   Multiple Vitamin (MULTIVITAMIN) tablet Take 1 tablet by mouth daily.     omeprazole (PRILOSEC) 20 MG capsule Take 20 mg by mouth 2 (two) times daily before a meal.      ondansetron (ZOFRAN-ODT) 4 MG disintegrating tablet DISSOLVE 1 TABLET ON TONGUE EVERY 8 HOURS AS NEEDED. 20 tablet 0   thiamine (VITAMIN B-1) 100 MG tablet Take 1 tablet by mouth daily.     Fremanezumab-vfrm  (AJOVY) 225 MG/1.5ML SOAJ Inject 225 mg into the skin every 30 (thirty) days. 1.68 mL 11   predniSONE (DELTASONE) 5 MG tablet Take 6 tablets, taper by 1 tablet daily until off the medication 21 tablet 0   promethazine (PHENERGAN) 25 MG tablet TAKE ONE TABLET EVERY 6 HOURS AS NEEDED FOR NAUSEA AND VOMITING (MAKE A FOLLOW-UP APPOINTMENT FOR FURTHER REFILLS) 30 tablet 1   propranolol (INDERAL) 60 MG tablet Take 1 tablet (60 mg total) by mouth daily. Pt must be seen by Np this month for further refills 30 tablet 0   Rimegepant Sulfate (NURTEC) 75 MG TBDP Take 75 mg by mouth as needed (Take 1 at onset of headache, max is 1 tablet in 24 hours). 12 tablet 11   SUMAtriptan (IMITREX STATDOSE SYSTEM) 6 MG/0.5ML SOAJ Inject 0.5 mLs into the skin as needed (use if maxalt fails). 6 mL 11   topiramate (TOPAMAX) 100 MG tablet Take 1 tablet (100 mg total) by mouth 2 (two) times daily. 180 tablet 1   No facility-administered medications prior to visit.    PAST MEDICAL HISTORY: Past Medical History:  Diagnosis Date   Anxiety    Arthritis    "a little in Right knee"   Chronic back pain    Depression    Endometriosis    GERD (gastroesophageal reflux disease)    Hallux varus (acquired), right foot    Headache(784.0)    MIGRAINES    History of blood transfusion 01/10/2003   [place] - 3 units transfused   Migraine    Neck pain    clench teeth at night   Neuromuscular disorder ([place])    BLACKOUT SPELLS  BUT NONE SINCE 1996    Pneumonia    hx of 3 years ago   PONV (postoperative nausea and vomiting)    with tonsils as child, no problems as adult   Recurrent sinusitis    Sleep apnea    Does not use CPAP   Tuberculosis    TB + SKIN TEST, CXR was normal    PAST SURGICAL HISTORY: Past Surgical History:  Procedure Laterality Date   ADENOIDECTOMY  1995   with tube placement   CESAREAN SECTION     2004    CHROMOPERTUBATION Bilateral 10/09/2015   Procedure: CHROMOPERTUBATION;  Surgeon: Everett Graff, MD;   Location: [facility];  Service: Gynecology;  Laterality: Bilateral;   CYSTOSCOPY Bilateral 03/20/2017   Procedure: CYSTOSCOPY;  Surgeon: Everett Graff, MD;  Location: [facility];  Service: Gynecology;  Laterality: Bilateral;   DILITATION & CURRETTAGE/HYSTROSCOPY WITH NOVASURE ABLATION N/A 10/09/2015   Procedure: DILATATION & CURETTAGE/HYSTEROSCOPY;  Surgeon: Everett Graff, MD;  Location: [facility];  Service: Gynecology;  Laterality: N/A;   KNEE ARTHROSCOPY Left    1988    KNEE ARTHROSCOPY Left 03/12/2014   Procedure: LEFT ARTHROSCOPY KNEE WITH DEBRIDEMENT;  Surgeon: Gearlean Alf, MD;  Location: [facility];  Service: Orthopedics;  Laterality: Left;  medical and lateral repair menius   LAPAROSCOPIC OVARIAN CYSTECTOMY Right  10/09/2015   Procedure: LAPAROSCOPIC RIGHT OVARIAN CYSTECTOMIES, LEFT PARATUBAL CYSTECTOMY ;  Surgeon: Everett Graff, MD;  Location: [facility];  Service: Gynecology;  Laterality: Right;   LIGAMENT REPAIR Right 09/14/2018   Procedure: Right hallux and 2nd metatarsal phalangeal joint collateral ligament and plantar plate repair;  Surgeon: Wylene Simmer, MD;  Location: [facility];  Service: Orthopedics;  Laterality: Right;  55mn   SLEEVE GASTROPLASTY     TONSILLECTOMY  1983, 1985   ADENOID X2 (SEPARATE SURGERY)  TUBE PLACEMENT   TOTAL LAPAROSCOPIC HYSTERECTOMY WITH SALPINGECTOMY Bilateral 03/20/2017   Procedure: HYSTERECTOMY TOTAL LAPAROSCOPIC WITH BILATERAL SALPINGECTOMY;  Surgeon: REverett Graff MD;  [facility];  Service: Gynecology;  Laterality: Bilateral;   [place], [place], 2013    FAMILY HISTORY: Family History  Problem Relation Age of Onset   Diabetes Father    Heart attack Father    CAD Father    Alcohol abuse Father    Stroke Mother    Diabetes Paternal Grandfather    Heart attack Paternal Grandfather    Hypertension Paternal Grandfather    Alcohol abuse Paternal Grandfather    Lung cancer Maternal  Grandfather     SOCIAL HISTORY: Social History   Socioeconomic History   Marital status: Married    Spouse name: Not on file   Number of children: 1   Years of education: Bachelors   Highest education level: Not on file  Occupational History   Not on file  Tobacco Use   Smoking status: Never    Passive exposure: Never   Smokeless tobacco: Never  Vaping Use   Vaping Use: Never used  Substance and Sexual Activity   Alcohol use: Yes    Alcohol/week: 1.0 standard drink of alcohol    Types: 1 Glasses of wine per week    Comment: social   Drug use: No   Sexual activity: Yes    Birth control/protection: None, Surgical  Other Topics Concern   Not on file  Social History Narrative   Lives at home with husband and son.   Right-handed.   4-6 cups caffeine per day.   Social Determinants of Health   Financial Resource Strain: Not on file  Food Insecurity: Not on file  Transportation Needs: Not on file  Physical Activity: Not on file  Stress: Not on file  Social Connections: Not on file  Intimate Partner Violence: Not on file   PHYSICAL EXAM  Vitals:   07/20/22 0855  BP: (!) 142/90  Pulse: (!) 52  Weight: 256 lb (116.1 kg)  Height: '5\' 1"'$  (1.549 m)   Body mass index is 48.37 kg/m.  Generalized: Well developed, in no acute distress  Neurological examination  Mentation: Alert oriented to time, place, history taking. Follows all commands speech and language fluent Cranial nerve II-XII: Pupils were equal round reactive to light. Extraocular movements were full, visual field were full on confrontational test. Facial sensation and strength were normal. Head turning and shoulder shrug  were normal and symmetric. Motor: Good strength overall, exception 4/5 right hip flexion Sensory: Sensory testing is intact to soft touch on all 4 extremities. No evidence of extinction is noted.  Coordination: Cerebellar testing reveals good finger-nose-finger and heel-to-shin bilaterally.   Gait and station: Gait is normal.   Reflexes: Deep tendon reflexes are symmetric and normal bilaterally.   DIAGNOSTIC DATA (LABS, IMAGING, TESTING) - I reviewed patient records, labs, notes, testing and imaging myself where available.  Lab Results  Component Value Date   WBC 8.9 09/30/2021   HGB 12.1 09/30/2021   HCT 35.7 (L) 09/30/2021   MCV 97.0 09/30/2021   PLT 220 09/30/2021      Component Value Date/Time   NA 137 09/30/2021 2044   K 3.4 (L) 09/30/2021 2044   CL 106 09/30/2021 2044   CO2 22 09/30/2021 2044   GLUCOSE 135 (H) 09/30/2021 2044   BUN 14 09/30/2021 2044   CREATININE 0.73 09/30/2021 2044   CALCIUM 9.0 09/30/2021 2044   GFRNONAA >60 09/30/2021 2044   GFRAA >60 03/26/2017 0135   No results found for: "CHOL", "HDL", "LDLCALC", "LDLDIRECT", "TRIG", "CHOLHDL" No results found for: "HGBA1C" No results found for: "VITAMINB12" Lab Results  Component Value Date   TSH 1.431 08/17/2021    Butler Denmark, AGNP-C, DNP 07/20/2022, 9:40 AM [facility] [address] [place], [place] [address] [phone]

## 2022-07-20 NOTE — Patient Instructions (Signed)
We will stay of other medication other than below:  Meds ordered this encounter  Medications   promethazine (PHENERGAN) 25 MG tablet    Sig: TAKE ONE TABLET EVERY 6 HOURS AS NEEDED FOR NAUSEA AND VOMITING (MAKE A FOLLOW-UP APPOINTMENT FOR FURTHER REFILLS)    Dispense:  10 tablet    Refill:  0   Fremanezumab-vfrm (AJOVY) 225 MG/1.5ML SOAJ    Sig: Inject 225 mg into the skin every 30 (thirty) days.    Dispense:  1.68 mL    Refill:  11   Rimegepant Sulfate (NURTEC) 75 MG TBDP    Sig: Take 75 mg by mouth as needed (Take 1 at onset of headache, max is 1 tablet in 24 hours).    Dispense:  12 tablet    Refill:  11   SUMAtriptan (IMITREX STATDOSE SYSTEM) 6 MG/0.5ML SOAJ    Sig: Inject 0.5 mLs into the skin as needed (use if maxalt fails).    Dispense:  6 mL    Refill:  11

## 2022-07-25 ENCOUNTER — Encounter (HOSPITAL_COMMUNITY): Payer: Self-pay

## 2022-07-25 ENCOUNTER — Other Ambulatory Visit: Payer: Self-pay

## 2022-07-25 ENCOUNTER — Emergency Department (HOSPITAL_COMMUNITY)
Admission: EM | Admit: 2022-07-25 | Discharge: 2022-07-25 | Discharge status: Against Medical Advice | Payer: BC Managed Care – PPO | Attending: Emergency Medicine | Admitting: Emergency Medicine

## 2022-07-25 ENCOUNTER — Emergency Department (HOSPITAL_COMMUNITY): Payer: BC Managed Care – PPO

## 2022-07-25 DIAGNOSIS — Z5321 Procedure and treatment not carried out due to patient leaving prior to being seen by health care provider: Secondary | ICD-10-CM | POA: Diagnosis not present

## 2022-07-25 DIAGNOSIS — G43909 Migraine, unspecified, not intractable, without status migrainosus: Secondary | ICD-10-CM | POA: Diagnosis not present

## 2022-07-25 DIAGNOSIS — R0789 Other chest pain: Secondary | ICD-10-CM | POA: Diagnosis not present

## 2022-07-25 DIAGNOSIS — Z20822 Contact with and (suspected) exposure to covid-19: Secondary | ICD-10-CM | POA: Diagnosis not present

## 2022-07-25 LAB — COMPREHENSIVE METABOLIC PANEL
ALT: 16 U/L (ref 0–44)
AST: 24 U/L (ref 15–41)
Albumin: 4.2 g/dL (ref 3.5–5.0)
Alkaline Phosphatase: 71 U/L (ref 38–126)
Anion gap: 12 (ref 5–15)
BUN: 11 mg/dL (ref 6–20)
CO2: 22 mmol/L (ref 22–32)
Calcium: 10.3 mg/dL (ref 8.9–10.3)
Chloride: 105 mmol/L (ref 98–111)
Creatinine, Ser: 0.77 mg/dL (ref 0.44–1.00)
GFR, Estimated: >60 mL/min (ref >60)
Glucose, Bld: 122 mg/dL — ABNORMAL HIGH (ref 70–99)
Potassium: 3.7 mmol/L (ref 3.5–5.1)
Sodium: 139 mmol/L (ref 135–145)
Total Bilirubin: 1.2 mg/dL (ref 0.3–1.2)
Total Protein: 7.3 g/dL (ref 6.5–8.1)

## 2022-07-25 LAB — CBC WITH DIFFERENTIAL/PLATELET
Abs Immature Granulocytes: 0.03 10*3/uL (ref 0.00–0.07)
Basophils Absolute: 0.1 10*3/uL (ref 0.0–0.1)
Basophils Relative: 1 %
Eosinophils Absolute: 0.2 10*3/uL (ref 0.0–0.5)
Eosinophils Relative: 2 %
HCT: 44.6 % (ref 36.0–46.0)
Hemoglobin: 15.4 g/dL — ABNORMAL HIGH (ref 12.0–15.0)
Immature Granulocytes: 0 %
Lymphocytes Relative: 25 %
Lymphs Abs: 2.5 10*3/uL (ref 0.7–4.0)
MCH: 32.8 pg (ref 26.0–34.0)
MCHC: 34.5 g/dL (ref 30.0–36.0)
MCV: 94.9 fL (ref 80.0–100.0)
Monocytes Absolute: 0.7 10*3/uL (ref 0.1–1.0)
Monocytes Relative: 7 %
Neutro Abs: 6.7 10*3/uL (ref 1.7–7.7)
Neutrophils Relative %: 65 %
Platelets: 315 10*3/uL (ref 150–400)
RBC: 4.7 MIL/uL (ref 3.87–5.11)
RDW: 12.2 % (ref 11.5–15.5)
WBC: 10.2 10*3/uL (ref 4.0–10.5)
nRBC: 0 % (ref 0.0–0.2)

## 2022-07-25 LAB — URINALYSIS, ROUTINE W REFLEX MICROSCOPIC
Bilirubin Urine: NEGATIVE
Glucose, UA: NEGATIVE mg/dL
Hgb urine dipstick: NEGATIVE
Ketones, ur: NEGATIVE mg/dL
Nitrite: NEGATIVE
Protein, ur: 30 mg/dL — AB
Specific Gravity, Urine: 1.024 (ref 1.005–1.030)
pH: 5 (ref 5.0–8.0)

## 2022-07-25 LAB — TROPONIN I (HIGH SENSITIVITY)
Troponin I (High Sensitivity): 5 ng/L (ref <18)
Troponin I (High Sensitivity): 5 ng/L (ref <18)

## 2022-07-25 LAB — RESP PANEL BY RT-PCR (FLU A&B, COVID) ARPGX2
Influenza A by PCR: NEGATIVE
Influenza B by PCR: NEGATIVE
SARS Coronavirus 2 by RT PCR: NEGATIVE

## 2022-07-25 LAB — LIPASE, BLOOD: Lipase: 26 U/L (ref 11–51)

## 2022-07-25 NOTE — ED Triage Notes (Signed)
Patient reports thought she was having sinus pressure so thought she was getting a migraine but then started gagging while trying to eat a cracker.  Has complaints of pain in various areas that have no correlation.  Hx of migraines.

## 2022-07-25 NOTE — ED Provider Triage Note (Signed)
Emergency Medicine Provider Triage Evaluation Note  Tina Hunt , a 46 y.o. female  was evaluated in triage.  Pt complains of chest pressure.  Reports that earlier she thought she was getting a migraine because of the weather changes however she developed a slight pressure on her chest.  Says it somewhat worse with inspiration.  No history of ACS, DVT/PE, estrogen products, leg swelling or recent travel.  Says that she has felt nauseated but has not vomited   Does report recent sinus infection and she did not complete antibiotics due to an allergy Review of Systems  Positive:  Negative:   Physical Exam  BP (!) 144/107 (BP Location: Right Arm)   Pulse 85   Temp 98.7 F (37.1 C) (Oral)   Resp 18   LMP  (LMP Unknown)   SpO2 98%  Gen:   Awake, no distress   Resp:  Normal effort  MSK:   Moves extremities without difficulty  Other:  Resting comfortably  Medical Decision Making  Medically screening exam initiated at 2:52 PM.  Appropriate orders placed.  Tina Hunt was informed that the remainder of the evaluation will be completed by another provider, this initial triage assessment does not replace that evaluation, and the importance of remaining in the ED until their evaluation is complete.     Rhae Hammock, PA-C 07/25/22 1453

## 2022-07-25 NOTE — ED Notes (Signed)
Pt stated she was leaving and would like her IV removed. Pt IV token out by this tech.

## 2022-09-08 ENCOUNTER — Other Ambulatory Visit: Payer: Self-pay | Admitting: Neurology

## 2023-01-03 ENCOUNTER — Emergency Department (HOSPITAL_BASED_OUTPATIENT_CLINIC_OR_DEPARTMENT_OTHER)
Admission: EM | Admit: 2023-01-03 | Discharge: 2023-01-03 | Disposition: A | Discharge status: Home / Self Care | Payer: BC Managed Care – PPO | Attending: Emergency Medicine | Admitting: Emergency Medicine

## 2023-01-03 ENCOUNTER — Encounter (HOSPITAL_BASED_OUTPATIENT_CLINIC_OR_DEPARTMENT_OTHER): Payer: Self-pay | Admitting: Emergency Medicine

## 2023-01-03 ENCOUNTER — Emergency Department (HOSPITAL_BASED_OUTPATIENT_CLINIC_OR_DEPARTMENT_OTHER): Payer: BC Managed Care – PPO | Admitting: Radiology

## 2023-01-03 ENCOUNTER — Other Ambulatory Visit: Payer: Self-pay

## 2023-01-03 DIAGNOSIS — E876 Hypokalemia: Secondary | ICD-10-CM | POA: Insufficient documentation

## 2023-01-03 DIAGNOSIS — R0789 Other chest pain: Secondary | ICD-10-CM | POA: Insufficient documentation

## 2023-01-03 DIAGNOSIS — R079 Chest pain, unspecified: Secondary | ICD-10-CM

## 2023-01-03 DIAGNOSIS — D72829 Elevated white blood cell count, unspecified: Secondary | ICD-10-CM | POA: Diagnosis not present

## 2023-01-03 DIAGNOSIS — N3000 Acute cystitis without hematuria: Secondary | ICD-10-CM | POA: Insufficient documentation

## 2023-01-03 LAB — CBC
HCT: 43.9 % (ref 36.0–46.0)
Hemoglobin: 15.4 g/dL — ABNORMAL HIGH (ref 12.0–15.0)
MCH: 33.5 pg (ref 26.0–34.0)
MCHC: 35.1 g/dL (ref 30.0–36.0)
MCV: 95.4 fL (ref 80.0–100.0)
Platelets: 258 10*3/uL (ref 150–400)
RBC: 4.6 MIL/uL (ref 3.87–5.11)
RDW: 12.2 % (ref 11.5–15.5)
WBC: 9.6 10*3/uL (ref 4.0–10.5)
nRBC: 0 % (ref 0.0–0.2)

## 2023-01-03 LAB — URINALYSIS, ROUTINE W REFLEX MICROSCOPIC
Bacteria, UA: NONE SEEN
Bilirubin Urine: NEGATIVE
Glucose, UA: NEGATIVE mg/dL
Hgb urine dipstick: NEGATIVE
Ketones, ur: NEGATIVE mg/dL
Nitrite: NEGATIVE
Protein, ur: NEGATIVE mg/dL
Specific Gravity, Urine: 1.015 (ref 1.005–1.030)
pH: 5.5 (ref 5.0–8.0)

## 2023-01-03 LAB — BASIC METABOLIC PANEL
Anion gap: 11 (ref 5–15)
BUN: 13 mg/dL (ref 6–20)
CO2: 26 mmol/L (ref 22–32)
Calcium: 10.2 mg/dL (ref 8.9–10.3)
Chloride: 102 mmol/L (ref 98–111)
Creatinine, Ser: 0.63 mg/dL (ref 0.44–1.00)
GFR, Estimated: >60 mL/min (ref >60)
Glucose, Bld: 86 mg/dL (ref 70–99)
Potassium: 3.2 mmol/L — ABNORMAL LOW (ref 3.5–5.1)
Sodium: 139 mmol/L (ref 135–145)

## 2023-01-03 LAB — TROPONIN I (HIGH SENSITIVITY)
Troponin I (High Sensitivity): 3 ng/L (ref <18)
Troponin I (High Sensitivity): 4 ng/L (ref <18)

## 2023-01-03 MED ORDER — HYDRALAZINE HCL 25 MG PO TABS
25.0000 mg | ORAL_TABLET | Freq: Once | ORAL | Status: AC
  Administered 2023-01-03: 25 mg via ORAL
  Filled 2023-01-03: qty 1

## 2023-01-03 MED ORDER — CEPHALEXIN 250 MG PO CAPS: 250.0000 mg | ORAL_CAPSULE | Freq: Four times a day (QID) | ORAL | 0 refills | Status: AC

## 2023-01-03 MED ORDER — HYDRALAZINE HCL 10 MG PO TABS: 20.0000 mg | ORAL_TABLET | ORAL | 0 refills | Status: DC | PRN

## 2023-01-03 NOTE — ED Provider Notes (Signed)
[place] [facility]   CSN: [id number] Arrival date & time: 01/03/23  1536     History  Chief Complaint  Patient presents with   Chest Pain    Tina CheadleJennifer C Hunt is a 47 y.o. female past medical history of anxiety, GERD, migraine presents today for evaluation of chest pain and palpitation.  Patient states she started to have chest pain and palpitation after drinking an espresso this morning.  States the pain is in the center of her chest, pressure-like, intermittent. States the symptoms have been intermittent throughout the day.  Patient also reports increased blood pressure this morning. She thinks that caused blurred vision and headache.  She endorses nausea without vomiting.  Denies any fever, shortness of breath, bowel change, urinary symptoms.  No recent trauma, travel or surgery.  No personal or family history of DVT/PE. Denies any cough.   Chest Pain     Past Medical History:  Diagnosis Date   Anxiety    Arthritis    "a little in Right knee"   Chronic back pain    Depression    Endometriosis    GERD (gastroesophageal reflux disease)    Hallux varus (acquired), right foot    Headache(784.0)    MIGRAINES    History of blood transfusion 01/10/2003   [facility] - 3 units transfused   Migraine    Neck pain    clench teeth at night   Neuromuscular disorder    BLACKOUT SPELLS  BUT NONE SINCE 1996    Pneumonia    hx of 3 years ago   PONV (postoperative nausea and vomiting)    with tonsils as child, no problems as adult   Recurrent sinusitis    Sleep apnea    Does not use CPAP   Tuberculosis    TB + SKIN TEST, CXR was normal   Past Surgical History:  Procedure Laterality Date   ADENOIDECTOMY  1995   with tube placement   CESAREAN SECTION     2004    CHROMOPERTUBATION Bilateral 10/09/2015   Procedure: CHROMOPERTUBATION;  Surgeon: Osborn CohoAngela Roberts, MD;  Location: [facility];  Service: Gynecology;  Laterality: Bilateral;    CYSTOSCOPY Bilateral 03/20/2017   Procedure: CYSTOSCOPY;  Surgeon: Osborn Cohooberts, Angela, MD;  Location: [facility];  Service: Gynecology;  Laterality: Bilateral;   DILITATION & CURRETTAGE/HYSTROSCOPY WITH NOVASURE ABLATION N/A 10/09/2015   Procedure: DILATATION & CURETTAGE/HYSTEROSCOPY;  Surgeon: Osborn CohoAngela Roberts, MD;  Location: [facility];  Service: Gynecology;  Laterality: N/A;   KNEE ARTHROSCOPY Left    1988    KNEE ARTHROSCOPY Left 03/12/2014   Procedure: LEFT ARTHROSCOPY KNEE WITH DEBRIDEMENT;  Surgeon: Loanne DrillingFrank Aluisio V, MD;  Location: [facility];  Service: Orthopedics;  Laterality: Left;  medical and lateral repair menius   LAPAROSCOPIC OVARIAN CYSTECTOMY Right 10/09/2015   Procedure: LAPAROSCOPIC RIGHT OVARIAN CYSTECTOMIES, LEFT PARATUBAL CYSTECTOMY ;  Surgeon: Osborn CohoAngela Roberts, MD;  Location: [facility];  Service: Gynecology;  Laterality: Right;   LIGAMENT REPAIR Right 09/14/2018   Procedure: Right hallux and 2nd metatarsal phalangeal joint collateral ligament and plantar plate repair;  Surgeon: Toni ArthursHewitt, John, MD;  Location: [facility];  Service: Orthopedics;  Laterality: Right;  90min   SLEEVE GASTROPLASTY     TONSILLECTOMY  1983, 1985   ADENOID X2 (SEPARATE SURGERY)  TUBE PLACEMENT   TOTAL LAPAROSCOPIC HYSTERECTOMY WITH SALPINGECTOMY Bilateral 03/20/2017   Procedure: HYSTERECTOMY TOTAL LAPAROSCOPIC WITH BILATERAL SALPINGECTOMY;  Surgeon: Osborn Cohooberts, Angela, MD;  Location: [facility];  Service: Gynecology;  Laterality: Bilateral;   TYMPANOSTOMY TUBE PLACEMENT  1989, 1991, 1999, 2002, 2006, 2009, 2013     Home Medications Prior to Admission medications   Medication Sig Start Date End Date Taking? Authorizing Provider  cephALEXin (KEFLEX) 250 MG capsule Take 1 capsule (250 mg total) by mouth 4 (four) times daily for 10 days. 01/03/23 01/13/23 Yes Jeanelle Malling, PA  hydrALAZINE (APRESOLINE) 10 MG tablet Take 2 tablets (20 mg total) by mouth as needed. 01/03/23  Yes Jeanelle Malling, PA  cholecalciferol (VITAMIN D3) 25 MCG (1000  UT) tablet Take 2,000 Units by mouth daily.    [provider]  clonazePAM (KLONOPIN) 0.5 MG tablet TAKE 1 TABLET DAILY AS NEEDED FOR ACUTE ANXIETY. 09/28/18   Danford, Orpha Bur D, NP  diphenhydrAMINE (BENADRYL) 25 mg capsule Take 25 mg by mouth every 6 (six) hours as needed for allergies.    [provider]  DULoxetine (CYMBALTA) 60 MG capsule 2 tabs once daily 08/07/18   Danford, Orpha Bur D, NP  Fremanezumab-vfrm (AJOVY) 225 MG/1.5ML SOAJ Inject 225 mg into the skin every 30 (thirty) days. 07/20/22   Glean Salvo, NP  methocarbamol (ROBAXIN) 500 MG tablet Take 1 tablet (500 mg total) by mouth 4 (four) times daily. Patient taking differently: Take 500 mg by mouth at bedtime as needed for muscle spasms. 03/12/14   Ollen Gross, MD  Multiple Vitamin (MULTIVITAMIN) tablet Take 1 tablet by mouth daily.    [provider]  omeprazole (PRILOSEC) 20 MG capsule Take 20 mg by mouth 2 (two) times daily before a meal.  12/19/17   [provider]  ondansetron (ZOFRAN-ODT) 4 MG disintegrating tablet DISSOLVE 1 TABLET ON TONGUE EVERY 8 HOURS AS NEEDED. 12/28/21   Glean Salvo, NP  promethazine (PHENERGAN) 25 MG tablet TAKE ONE TABLET EVERY 6 HOURS AS NEEDED FOR NAUSEA AND VOMITING. 09/08/22   Glean Salvo, NP  Rimegepant Sulfate (NURTEC) 75 MG TBDP Take 75 mg by mouth as needed (Take 1 at onset of headache, max is 1 tablet in 24 hours). 07/20/22   Glean Salvo, NP  SUMAtriptan (IMITREX STATDOSE SYSTEM) 6 MG/0.5ML SOAJ Inject 0.5 mLs into the skin as needed (use if maxalt fails). 07/20/22   Glean Salvo, NP  thiamine (VITAMIN B-1) 100 MG tablet Take 1 tablet by mouth daily.    [provider]      Allergies    Tape, Aspirin, Biaxin [clarithromycin], Nsaids, and Penicillins    Review of Systems   Review of Systems  Cardiovascular:  Positive for chest pain.    Physical Exam Updated Vital Signs BP (!) 158/99   Pulse 67   Temp 98.1 F (36.7 C) (Oral)   Resp (!)  21   LMP  (LMP Unknown)   SpO2 97%  Physical Exam Vitals and nursing note reviewed.  Constitutional:      Appearance: Normal appearance.  HENT:     Head: Normocephalic and atraumatic.     Mouth/Throat:     Mouth: Mucous membranes are moist.  Eyes:     General: No scleral icterus. Cardiovascular:     Rate and Rhythm: Normal rate and regular rhythm.     Pulses: Normal pulses.     Heart sounds: Normal heart sounds.  Pulmonary:     Effort: Pulmonary effort is normal.     Breath sounds: Normal breath sounds.  Abdominal:     General: Abdomen is flat.     Palpations: Abdomen is soft.  Tenderness: There is no abdominal tenderness.  Musculoskeletal:        General: No deformity.  Skin:    General: Skin is warm.     Findings: No rash.  Neurological:     General: No focal deficit present.     Mental Status: She is alert.     Comments: Cranial nerves II through XII intact. Intact sensation to light touch in all 4 extremities. 5/5 strength in all 4 extremities. Intact finger-to-nose and heel-to-shin of all 4 extremities. No visual field cuts. No neglect noted. No aphasia noted.   Psychiatric:        Mood and Affect: Mood normal.     ED Results / Procedures / Treatments   Labs (all labs ordered are listed, but only abnormal results are displayed) Labs Reviewed  BASIC METABOLIC PANEL - Abnormal; Notable for the following components:      Result Value   Potassium 3.2 (*)    All other components within normal limits  CBC - Abnormal; Notable for the following components:   Hemoglobin 15.4 (*)    All other components within normal limits  URINALYSIS, ROUTINE W REFLEX MICROSCOPIC - Abnormal; Notable for the following components:   Leukocytes,Ua MODERATE (*)    All other components within normal limits  TROPONIN I (HIGH SENSITIVITY)  TROPONIN I (HIGH SENSITIVITY)    EKG EKG Interpretation  Date/Time:  Tuesday January 03 2023 16:20:23 EDT Ventricular Rate:  61 PR  Interval:  156 QRS Duration: 78 QT Interval:  388 QTC Calculation: 390 R Axis:   24 Text Interpretation: Normal sinus rhythm Cannot rule out Anterior infarct (cited on or before 01-Oct-2021) Nonspecific T inversion III and V3 present on prior Abnormal ECG When compared with ECG of 25-Jul-2022 14:40, QRS axis Shifted right QT has shortened Confirmed by Vivien Rossetti ([id number]) on 01/03/2023 4:24:46 PM  Radiology DG Chest 2 View  Result Date: 01/03/2023 CLINICAL DATA:  chest pressure EXAM: CHEST - 2 VIEW COMPARISON:  07/25/2022. FINDINGS: The heart size and mediastinal contours are within normal limits. Both lungs are clear. No pneumothorax or pleural effusion. There are thoracic degenerative changes. IMPRESSION: No active cardiopulmonary disease. Electronically Signed   By: Layla Maw M.D.   On: 01/03/2023 17:37    Procedures Procedures    Medications Ordered in ED Medications  hydrALAZINE (APRESOLINE) tablet 25 mg (25 mg Oral Given 01/03/23 1946)    ED Course/ Medical Decision Making/ A&P                             Medical Decision Making Amount and/or Complexity of Data Reviewed Labs: ordered. Radiology: ordered.   This patient presents to the ED for chest pain, this involves an extensive number of treatment options, and is a complaint that carries with a high risk of complications and morbidity.  The differential diagnosis includes ACS, pericarditis, PE, pneumothorax, pneumonia.  This is not an exhaustive list.  Lab tests: I ordered and personally interpreted labs.  The pertinent results include: WBC unremarkable. Hbg unremarkable. Platelets unremarkable. Electrolytes significant for hypokalemia 3.2.. BUN, creatinine unremarkable.  UA positive for leukocytes.  Imaging studies: I ordered imaging studies. I personally reviewed, interpreted imaging and agree with the radiologist's interpretations. The results include: CT chest negative.  Problem list/ ED course/ Critical  interventions/ Medical management: HPI: See above Vital signs within normal range and stable throughout visit. Laboratory/imaging studies significant for: See above. On physical examination, patient  is afebrile and appears in no acute distress. Exam without evidence of volume overload so doubt heart failure. EKG without signs of active ischemia. Given the timing of pain to ER presentation, delta Theodis Aguas was negative so doubt NSTEMI. Presentation not consistent with acute PE (PERC negative), pneumothorax (not visualized on chest xr), thoracic aortic dissection, pericarditis, tamponade, pneumonia (no infectious symptoms, clear chest xr), myocarditis (no recent illness, neg trop). HEART score: 2 so plan to discharge patient home with PCP and cardiology follow up.  I have reviewed the patient home medicines and have made adjustments as needed.  Cardiac monitoring/EKG: The patient was maintained on a cardiac monitor.  I personally reviewed and interpreted the cardiac monitor which showed an underlying rhythm of: sinus rhythm.  Additional history obtained: External records from outside source obtained and reviewed including: Chart review including previous notes, labs, imaging.  Consultations obtained:  Disposition Continued outpatient therapy. Follow-up with PCP and cardiology recommended for reevaluation of symptoms. Treatment plan discussed with patient.  Pt acknowledged understanding was agreeable to the plan. Worrisome signs and symptoms were discussed with patient, and patient acknowledged understanding to return to the ED if they noticed these signs and symptoms. Patient was stable upon discharge.   This chart was dictated using voice recognition software.  Despite best efforts to proofread,  errors can occur which can change the documentation meaning.          Final Clinical Impression(s) / ED Diagnoses Final diagnoses:  Chest pain, unspecified type  Acute cystitis without hematuria     Rx / DC Orders ED Discharge Orders          Ordered    cephALEXin (KEFLEX) 250 MG capsule  4 times daily        01/03/23 2028    hydrALAZINE (APRESOLINE) 10 MG tablet  As needed        01/03/23 2028    Ambulatory referral to Cardiology       Comments: If you have not heard from the Cardiology office within the next 72 hours please call [phone].   01/03/23 2029              Jeanelle Malling, PA 01/03/23 2036    Mardene Sayer, MD 01/04/23 1139

## 2023-01-03 NOTE — Discharge Instructions (Addendum)
Please take your antibiotics as prescribed. Take hydralazine as needed when your blood pressure is high. I recommend close follow-up with cardiology and PCP for reevaluation.  Please do not hesitate to return to emergency department if worrisome signs symptoms we discussed become apparent.

## 2023-01-03 NOTE — ED Notes (Signed)
Reviewed AVS with patient, patient expressed understanding of directions, denies further questions at this time. 

## 2023-01-03 NOTE — ED Triage Notes (Signed)
Episode of feeling flush, BP 180/120 at time. Chest pressure and "pounding in chest"

## 2023-02-07 NOTE — Patient Instructions (Addendum)
SURGICAL WAITING ROOM VISITATION  Patients having surgery or a procedure may have no more than 2 support people in the waiting area - these visitors may rotate.    Children under the age of 23 must have an adult with them who is not the patient.  Due to an increase in RSV and influenza rates and associated hospitalizations, children ages 72 and under may not visit patients in [facility] hospitals.  If the patient needs to stay at the hospital during part of their recovery, the visitor guidelines for inpatient rooms apply. Pre-op nurse will coordinate an appropriate time for 1 support person to accompany patient in pre-op.  This support person may not rotate.    Please refer to the [facility] website for the visitor guidelines for Inpatients (after your surgery is over and you are in a regular room).       Your procedure is scheduled on: 02-21-23   Report to [facility] Main Entrance    Report to admitting at      0610 AM   Call this number if you have problems the morning of surgery [phone]   Do not eat food :After Midnight.   After Midnight you may have the following liquids until __0540____ AM DAY OF SURGERY  then nothing by mouth  Water Non-Citrus Juices (without pulp, NO RED-Apple, White grape, White cranberry) Black Coffee (NO MILK/CREAM OR CREAMERS, sugar ok)  Clear Tea (NO MILK/CREAM OR CREAMERS, sugar ok) regular and decaf                             Plain Jell-O (NO RED)                                           Fruit ices (not with fruit pulp, NO RED)                                     Popsicles (NO RED)                                                               Sports drinks like Gatorade (NO RED)                    The day of surgery:  Drink ONE (1) Pre-Surgery Clear Ensure  at    0520AM the morning of surgery. Drink in one sitting. Do not sip.  This drink was given to you during your hospital  pre-op appointment visit. Nothing else to drink  after completing the  Pre-Surgery Clear Ensure by 0540 am          If you have questions, please contact your surgeon's office.   FOLLOW ANY ADDITIONAL PRE OP INSTRUCTIONS YOU RECEIVED FROM YOUR SURGEON'S OFFICE!!!     Oral Hygiene is also important to reduce your risk of infection.  Remember - BRUSH YOUR TEETH THE MORNING OF SURGERY WITH YOUR REGULAR TOOTHPASTE  DENTURES WILL BE REMOVED PRIOR TO SURGERY PLEASE DO NOT APPLY "Poly grip" OR ADHESIVES!!!   Do NOT smoke after Midnight   Take these medicines the morning of surgery with A SIP OF WATER: omeprazole, duloxetine, Topirimate, oxycodone, hydralazine if needed, gabapentin  DO NOT TAKE ANY ORAL DIABETIC MEDICATIONS DAY OF YOUR SURGERY  Bring CPAP mask and tubing day of surgery.                              You may not have any metal on your body including hair pins, jewelry, and body piercing             Do not wear make-up, lotions, powders, perfumes/cologne, or deodorant  Do not wear nail polish including gel and S&S, artificial/acrylic nails, or any other type of covering on natural nails including finger and toenails. If you have artificial nails, gel coating, etc. that needs to be removed by a nail salon please have this removed prior to surgery or surgery may need to be canceled/ delayed if the surgeon/ anesthesia feels like they are unable to be safely monitored.     Do not bring valuables to the hospital. [facility] IS NOT             RESPONSIBLE   FOR VALUABLES.   Contacts, glasses, dentures or bridgework may not be worn into surgery.   Bring small overnight bag day of surgery.   DO NOT BRING YOUR HOME MEDICATIONS TO THE HOSPITAL. PHARMACY WILL DISPENSE MEDICATIONS LISTED ON YOUR MEDICATION LIST TO YOU DURING YOUR ADMISSION IN THE HOSPITAL!      Special Instructions: Bring a copy of your healthcare power of attorney and living will documents the day of surgery if you haven't  scanned them before.              Please read over the following fact sheets you were given: IF YOU HAVE QUESTIONS ABOUT YOUR PRE-OP INSTRUCTIONS PLEASE CALL [phone]    If you test positive for Covid or have been in contact with anyone that has tested positive in the last 10 days please notify you surgeon.      Pre-operative 5 CHG Bath Instructions   You can play a key role in reducing the risk of infection after surgery. Your skin needs to be as free of germs as possible. You can reduce the number of germs on your skin by washing with CHG (chlorhexidine gluconate) soap before surgery. CHG is an antiseptic soap that kills germs and continues to kill germs even after washing.   DO NOT use if you have an allergy to chlorhexidine/CHG or antibacterial soaps. If your skin becomes reddened or irritated, stop using the CHG and notify one of our RNs at [phone].   Please shower with the CHG soap starting 4 days before surgery using the following schedule:     Please keep in mind the following:  DO NOT shave, including legs and underarms, starting the day of your first shower.   You may shave your face at any point before/day of surgery.  Place clean sheets on your bed the day you start using CHG soap. Use a clean washcloth (not used since being washed) for each shower. DO NOT sleep with pets once you start using the CHG.   CHG Shower Instructions:  If you choose to wash  your hair and private area, wash first with your normal shampoo/soap.  After you use shampoo/soap, rinse your hair and body thoroughly to remove shampoo/soap residue.  Turn the water OFF and apply about 3 tablespoons (45 ml) of CHG soap to a CLEAN washcloth.  Apply CHG soap ONLY FROM YOUR NECK DOWN TO YOUR TOES (washing for 3-5 minutes)  DO NOT use CHG soap on face, private areas, open wounds, or sores.  Pay special attention to the area where your surgery is being performed.  If you are having back surgery, having  someone wash your back for you may be helpful. Wait 2 minutes after CHG soap is applied, then you may rinse off the CHG soap.  Pat dry with a clean towel  Put on clean clothes/pajamas   If you choose to wear lotion, please use ONLY the CHG-compatible lotions on the back of this paper.     Additional instructions for the day of surgery: DO NOT APPLY any lotions, deodorants, cologne, or perfumes.   Put on clean/comfortable clothes.  Brush your teeth.  Ask your nurse before applying any prescription medications to the skin.      CHG Compatible Lotions   Aveeno Moisturizing lotion  Cetaphil Moisturizing Cream  Cetaphil Moisturizing Lotion  Clairol Herbal Essence Moisturizing Lotion, Dry Skin  Clairol Herbal Essence Moisturizing Lotion, Extra Dry Skin  Clairol Herbal Essence Moisturizing Lotion, Normal Skin  Curel Age Defying Therapeutic Moisturizing Lotion with Alpha Hydroxy  Curel Extreme Care Body Lotion  Curel Soothing Hands Moisturizing Hand Lotion  Curel Therapeutic Moisturizing Cream, Fragrance-Free  Curel Therapeutic Moisturizing Lotion, Fragrance-Free  Curel Therapeutic Moisturizing Lotion, Original Formula  Eucerin Daily Replenishing Lotion  Eucerin Dry Skin Therapy Plus Alpha Hydroxy Crme  Eucerin Dry Skin Therapy Plus Alpha Hydroxy Lotion  Eucerin Original Crme  Eucerin Original Lotion  Eucerin Plus Crme Eucerin Plus Lotion  Eucerin TriLipid Replenishing Lotion  Keri Anti-Bacterial Hand Lotion  Keri Deep Conditioning Original Lotion Dry Skin Formula Softly Scented  Keri Deep Conditioning Original Lotion, Fragrance Free Sensitive Skin Formula  Keri Lotion Fast Absorbing Fragrance Free Sensitive Skin Formula  Keri Lotion Fast Absorbing Softly Scented Dry Skin Formula  Keri Original Lotion  Keri Skin Renewal Lotion Keri Silky Smooth Lotion  Keri Silky Smooth Sensitive Skin Lotion  Nivea Body Creamy Conditioning Oil  Nivea Body Extra Enriched Lotion  Nivea Body  Original Lotion  Nivea Body Sheer Moisturizing Lotion Nivea Crme  Nivea Skin Firming Lotion  NutraDerm 30 Skin Lotion  NutraDerm Skin Lotion  NutraDerm Therapeutic Skin Cream  NutraDerm Therapeutic Skin Lotion  ProShield Protective Hand Cream  Provon moisturizing lotion      Incentive Spirometer  An incentive spirometer is a tool that can help keep your lungs clear and active. This tool measures how well you are filling your lungs with each breath. Taking long deep breaths may help reverse or decrease the chance of developing breathing (pulmonary) problems (especially infection) following: A long period of time when you are unable to move or be active. BEFORE THE PROCEDURE  If the spirometer includes an indicator to show your best effort, your nurse or respiratory therapist will set it to a desired goal. If possible, sit up straight or lean slightly forward. Try not to slouch. Hold the incentive spirometer in an upright position. INSTRUCTIONS FOR USE  Sit on the edge of your bed if possible, or sit up as far as you can in bed or on a chair. Hold the incentive  spirometer in an upright position. Breathe out normally. Place the mouthpiece in your mouth and seal your lips tightly around it. Breathe in slowly and as deeply as possible, raising the piston or the ball toward the top of the column. Hold your breath for 3-5 seconds or for as long as possible. Allow the piston or ball to fall to the bottom of the column. Remove the mouthpiece from your mouth and breathe out normally. Rest for a few seconds and repeat Steps 1 through 7 at least 10 times every 1-2 hours when you are awake. Take your time and take a few normal breaths between deep breaths. The spirometer may include an indicator to show your best effort. Use the indicator as a goal to work toward during each repetition. After each set of 10 deep breaths, practice coughing to be sure your lungs are clear. If you have an incision  (the cut made at the time of surgery), support your incision when coughing by placing a pillow or rolled up towels firmly against it. Once you are able to get out of bed, walk around indoors and cough well. You may stop using the incentive spirometer when instructed by your caregiver.  RISKS AND COMPLICATIONS Take your time so you do not get dizzy or light-headed. If you are in pain, you may need to take or ask for pain medication before doing incentive spirometry. It is harder to take a deep breath if you are having pain. AFTER USE Rest and breathe slowly and easily. It can be helpful to keep track of a log of your progress. Your caregiver can provide you with a simple table to help with this. If you are using the spirometer at home, follow these instructions: SEEK MEDICAL CARE IF:  You are having difficultly using the spirometer. You have trouble using the spirometer as often as instructed. Your pain medication is not giving enough relief while using the spirometer. You develop fever of 100.5 F (38.1 C) or higher. SEEK IMMEDIATE MEDICAL CARE IF:  You cough up bloody sputum that had not been present before. You develop fever of 102 F (38.9 C) or greater. You develop worsening pain at or near the incision site. MAKE SURE YOU:  Understand these instructions. Will watch your condition. Will get help right away if you are not doing well or get worse. Document Released: 01/23/2007 Document Revised: 12/05/2011 Document Reviewed: 03/26/2007 [employer] Patient Information 2014 [employer], [place].   ________________________________________________________________________ WHAT IS A BLOOD TRANSFUSION? Blood Transfusion Information  A transfusion is the replacement of blood or some of its parts. Blood is made up of multiple cells which provide different functions. Red blood cells carry oxygen and are used for blood loss replacement. White blood cells fight against infection. Platelets control  bleeding. Plasma helps clot blood. Other blood products are available for specialized needs, such as hemophilia or other clotting disorders. BEFORE THE TRANSFUSION  Who gives blood for transfusions?  Healthy volunteers who are fully evaluated to make sure their blood is safe. This is blood bank blood. Transfusion therapy is the safest it has ever been in the practice of medicine. Before blood is taken from a donor, a complete history is taken to make sure that person has no history of diseases nor engages in risky social behavior (examples are intravenous drug use or sexual activity with multiple partners). The donor's travel history is screened to minimize risk of transmitting infections, such as malaria. The donated blood is tested for signs of infectious  diseases, such as HIV and hepatitis. The blood is then tested to be sure it is compatible with you in order to minimize the chance of a transfusion reaction. If you or a relative donates blood, this is often done in anticipation of surgery and is not appropriate for emergency situations. It takes many days to process the donated blood. RISKS AND COMPLICATIONS Although transfusion therapy is very safe and saves many lives, the main dangers of transfusion include:  Getting an infectious disease. Developing a transfusion reaction. This is an allergic reaction to something in the blood you were given. Every precaution is taken to prevent this. The decision to have a blood transfusion has been considered carefully by your caregiver before blood is given. Blood is not given unless the benefits outweigh the risks. AFTER THE TRANSFUSION Right after receiving a blood transfusion, you will usually feel much better and more energetic. This is especially true if your red blood cells have gotten low (anemic). The transfusion raises the level of the red blood cells which carry oxygen, and this usually causes an energy increase. The nurse administering the  transfusion will monitor you carefully for complications. HOME CARE INSTRUCTIONS  No special instructions are needed after a transfusion. You may find your energy is better. Speak with your caregiver about any limitations on activity for underlying diseases you may have. SEEK MEDICAL CARE IF:  Your condition is not improving after your transfusion. You develop redness or irritation at the intravenous (IV) site. SEEK IMMEDIATE MEDICAL CARE IF:  Any of the following symptoms occur over the next 12 hours: Shaking chills. You have a temperature by mouth above 102 F (38.9 C), not controlled by medicine. Chest, back, or muscle pain. People around you feel you are not acting correctly or are confused. Shortness of breath or difficulty breathing. Dizziness and fainting. You get a rash or develop hives. You have a decrease in urine output. Your urine turns a dark color or changes to pink, red, or brown. Any of the following symptoms occur over the next 10 days: You have a temperature by mouth above 102 F (38.9 C), not controlled by medicine. Shortness of breath. Weakness after normal activity. The white part of the eye turns yellow (jaundice). You have a decrease in the amount of urine or are urinating less often. Your urine turns a dark color or changes to pink, red, or brown. Document Released: 09/09/2000 Document Revised: 12/05/2011 Document Reviewed: 04/28/2008 [facility] Patient Information 2014 [place], [place].  _______________________________________________________________________

## 2023-02-07 NOTE — Progress Notes (Addendum)
PCP - Camie Patience , NP [facility] - no  PPM/ICD -  Device Orders -  Rep Notified -   Chest x-ray - 01-03-23 epic EKG - 01-03-23 epic Stress Test -  ECHO -  Cardiac Cath -   Sleep Study -  CPAP -   Fasting Blood Sugar -  Checks Blood Sugar _____ times a day  Blood Thinner Instructions: Aspirin Instructions:  ERAS Protcol - PRE-SURGERY Ensure    COVID vaccine -x2  Activity--Able to complete ADL's without SOB or CP Anesthesia review: OSA no CPAP due to weight loss. Stopped Diethylpropion last dose 02-08-23  Patient denies shortness of breath, fever, cough and chest pain at PAT appointment   All instructions explained to the patient, with a verbal understanding of the material. Patient agrees to go over the instructions while at home for a better understanding. Patient also instructed to self quarantine after being tested for COVID-19. The opportunity to ask questions was provided.

## 2023-02-07 NOTE — Progress Notes (Deleted)
.  pat

## 2023-02-09 ENCOUNTER — Encounter (HOSPITAL_COMMUNITY): Payer: Self-pay

## 2023-02-09 ENCOUNTER — Encounter (HOSPITAL_COMMUNITY)
Admission: RE | Admit: 2023-02-09 | Discharge: 2023-02-09 | Disposition: A | Discharge status: Home / Self Care | Payer: BC Managed Care – PPO | Source: Ambulatory Visit | Attending: Orthopedic Surgery | Admitting: Orthopedic Surgery

## 2023-02-09 ENCOUNTER — Other Ambulatory Visit: Payer: Self-pay

## 2023-02-09 VITALS — BP 154/86 | HR 64 | Temp 98.2°F | Resp 16 | Ht 62.0 in | Wt 227.0 lb

## 2023-02-09 DIAGNOSIS — Z01818 Encounter for other preprocedural examination: Secondary | ICD-10-CM

## 2023-02-09 DIAGNOSIS — M1611 Unilateral primary osteoarthritis, right hip: Secondary | ICD-10-CM | POA: Diagnosis not present

## 2023-02-09 DIAGNOSIS — Z01812 Encounter for preprocedural laboratory examination: Secondary | ICD-10-CM | POA: Diagnosis present

## 2023-02-09 LAB — CBC
HCT: 41.8 % (ref 36.0–46.0)
Hemoglobin: 14.1 g/dL (ref 12.0–15.0)
MCH: 33.6 pg (ref 26.0–34.0)
MCHC: 33.7 g/dL (ref 30.0–36.0)
MCV: 99.5 fL (ref 80.0–100.0)
Platelets: 267 10*3/uL (ref 150–400)
RBC: 4.2 MIL/uL (ref 3.87–5.11)
RDW: 13 % (ref 11.5–15.5)
WBC: 10.7 10*3/uL — ABNORMAL HIGH (ref 4.0–10.5)
nRBC: 0 % (ref 0.0–0.2)

## 2023-02-09 LAB — BASIC METABOLIC PANEL
Anion gap: 6 (ref 5–15)
BUN: 25 mg/dL — ABNORMAL HIGH (ref 6–20)
CO2: 25 mmol/L (ref 22–32)
Calcium: 9 mg/dL (ref 8.9–10.3)
Chloride: 107 mmol/L (ref 98–111)
Creatinine, Ser: 0.69 mg/dL (ref 0.44–1.00)
GFR, Estimated: >60 mL/min (ref >60)
Glucose, Bld: 86 mg/dL (ref 70–99)
Potassium: 4 mmol/L (ref 3.5–5.1)
Sodium: 138 mmol/L (ref 135–145)

## 2023-02-09 LAB — TYPE AND SCREEN
ABO/RH(D): O NEG
Antibody Screen: NEGATIVE

## 2023-02-09 LAB — SURGICAL PCR SCREEN
MRSA, PCR: NEGATIVE
Staphylococcus aureus: NEGATIVE

## 2023-02-17 ENCOUNTER — Ambulatory Visit: Payer: BC Managed Care – PPO | Admitting: Cardiology

## 2023-02-18 NOTE — H&P (Signed)
TOTAL HIP ADMISSION H&P  Patient is admitted for right total hip arthroplasty.  Subjective:  Chief Complaint: right hip pain  HPI: Tina Hunt, 47 y.o. female, has a history of pain and functional disability in the right hip(s) due to arthritis and patient has failed non-surgical conservative treatments for greater than 12 weeks to include NSAID's and/or analgesics, corticosteriod injections, and activity modification.  Onset of symptoms was gradual starting 2 years ago with gradually worsening course since that time.The patient noted no past surgery on the right hip(s).  Patient currently rates pain in the right hip at 8 out of 10 with activity. Patient has worsening of pain with activity and weight bearing, pain that interfers with activities of daily living, and pain with passive range of motion. Patient has evidence of joint space narrowing by imaging studies. This condition presents safety issues increasing the risk of falls. There is no current active infection.  Patient Active Problem List   Diagnosis Date Noted   Intractable migraine without aura and with status migrainosus 08/29/2019   Healthcare maintenance 05/03/2018   Anxiety 05/03/2018   Depression 05/03/2018   Acute post-operative pain 03/26/2017   Chronic migraine w/o aura w/o status migrainosus, not intractable 12/22/2016   Migraine 12/22/2016   Acute medial meniscal tear 03/11/2014   Eustachian tube dysfunction 11/28/2011   Past Medical History:  Diagnosis Date   Anxiety    Arthritis    "a little in Right knee"   Chronic back pain    Depression    Endometriosis    GERD (gastroesophageal reflux disease)    Hallux varus (acquired), right foot    Headache(784.0)    MIGRAINES    History of blood transfusion 01/10/2003   [facility] - 3 units transfused   with c section   Migraine    Neck pain    clench teeth at night   Neuromuscular disorder (HCC)    BLACKOUT SPELLS  BUT NONE SINCE 1996    Pneumonia    hx of 3  years ago   PONV (postoperative nausea and vomiting)    with tonsils as child, no problems as adult   Recurrent sinusitis    Sleep apnea    Does not use CPAP  due yo weight loss   Tuberculosis    TB + SKIN TEST, CXR was normal    Past Surgical History:  Procedure Laterality Date   ADENOIDECTOMY  1995   with tube placement   CESAREAN SECTION     2004    CHROMOPERTUBATION Bilateral 10/09/2015   Procedure: CHROMOPERTUBATION;  Surgeon: Osborn Coho, MD;  Location: [facility];  Service: Gynecology;  Laterality: Bilateral;   CYSTOSCOPY Bilateral 03/20/2017   Procedure: CYSTOSCOPY;  Surgeon: Osborn Coho, MD;  Location: [facility];  Service: Gynecology;  Laterality: Bilateral;   DILITATION & CURRETTAGE/HYSTROSCOPY WITH NOVASURE ABLATION N/A 10/09/2015   Procedure: DILATATION & CURETTAGE/HYSTEROSCOPY;  Surgeon: Osborn Coho, MD;  Location: [facility];  Service: Gynecology;  Laterality: N/A;   KNEE ARTHROSCOPY Left    1988    KNEE ARTHROSCOPY Left 03/12/2014   Procedure: LEFT ARTHROSCOPY KNEE WITH DEBRIDEMENT;  Surgeon: Loanne Drilling, MD;  Location: [facility];  Service: Orthopedics;  Laterality: Left;  medical and lateral repair menius   LAPAROSCOPIC OVARIAN CYSTECTOMY Right 10/09/2015   Procedure: LAPAROSCOPIC RIGHT OVARIAN CYSTECTOMIES, LEFT PARATUBAL CYSTECTOMY ;  Surgeon: Osborn Coho, MD;  Location: [facility];  Service: Gynecology;  Laterality: Right;   LIGAMENT REPAIR Right 09/14/2018   Procedure: Right  hallux and 2nd metatarsal phalangeal joint collateral ligament and plantar plate repair;  Surgeon: Toni Arthurs, MD;  Location: [facility];  Service: Orthopedics;  Laterality: Right;    SLEEVE GASTROPLASTY     TONSILLECTOMY  1983, 1985   ADENOID X2 (SEPARATE SURGERY)  TUBE PLACEMENT   TOTAL LAPAROSCOPIC HYSTERECTOMY WITH SALPINGECTOMY Bilateral 03/20/2017   Procedure: HYSTERECTOMY TOTAL LAPAROSCOPIC WITH BILATERAL SALPINGECTOMY;  Surgeon: Osborn Coho, MD;  Location: [facility];   Service: Gynecology;  Laterality: Bilateral;   TYMPANOSTOMY TUBE PLACEMENT  1989, 1991, 1999, 2002, 2006, 2009, 2013    No current facility-administered medications for this encounter.   Current Outpatient Medications  Medication Sig Dispense Refill Last Dose   acetaminophen (TYLENOL) 500 MG tablet Take 1,000 mg by mouth every 6 (six) hours as needed for moderate pain.      Cholecalciferol 1.25 MG (50000 UT) capsule Take 50,000 Units by mouth once a week.      Diethylpropion HCl CR 75 MG TB24 Take 75 mg by mouth daily.      diphenhydrAMINE (BENADRYL) 25 mg capsule Take 25 mg by mouth every 6 (six) hours as needed for allergies.      DULoxetine (CYMBALTA) 30 MG capsule Take 60 mg by mouth at bedtime.      Fremanezumab-vfrm (AJOVY) 225 MG/1.5ML SOAJ Inject 225 mg into the skin every 30 (thirty) days. 1.68 mL 11    gabapentin (NEURONTIN) 100 MG capsule Take 100 mg by mouth 3 (three) times daily as needed (pain).      gabapentin (NEURONTIN) 600 MG tablet Take 600 mg by mouth 4 (four) times daily.      hydrALAZINE (APRESOLINE) 10 MG tablet Take 2 tablets (20 mg total) by mouth as needed. (Patient taking differently: Take 20 mg by mouth daily as needed (migraine).) 20 tablet 0    ibuprofen (ADVIL) 200 MG tablet Take 600 mg by mouth every 6 (six) hours as needed for moderate pain.      Multiple Vitamin (MULTIVITAMIN) tablet Take 1 tablet by mouth daily.      omeprazole (PRILOSEC) 20 MG capsule Take 20 mg by mouth 2 (two) times daily before a meal.       ondansetron (ZOFRAN-ODT) 4 MG disintegrating tablet DISSOLVE 1 TABLET ON TONGUE EVERY 8 HOURS AS NEEDED. 20 tablet 0    Oxycodone HCl 10 MG TABS Take 10 mg by mouth in the morning, at noon, in the evening, and at bedtime.      Rimegepant Sulfate (NURTEC) 75 MG TBDP Take 75 mg by mouth as needed (Take 1 at onset of headache, max is 1 tablet in 24 hours). 12 tablet 11    SUMAtriptan (IMITREX STATDOSE SYSTEM) 6 MG/0.5ML SOAJ Inject 0.5 mLs into the skin  as needed (use if maxalt fails). 6 mL 11    tiZANidine (ZANAFLEX) 4 MG tablet Take 4 mg by mouth 3 (three) times daily.      topiramate (TOPAMAX) 50 MG tablet Take 50 mg by mouth 2 (two) times daily.      traZODone (DESYREL) 50 MG tablet Take 25-100 mg by mouth at bedtime as needed for sleep.      Allergies  Allergen Reactions   Tape Rash   Aspirin Other (See Comments)    Stomach cramping   Oseltamivir Rash   Biaxin [Clarithromycin] Diarrhea and Nausea And Vomiting   Nsaids Other (See Comments)    Had gastric bypass, cannot take   Penicillins Hives, Nausea And Vomiting and Rash  Tolerates cephalexin.     Social History   Tobacco Use   Smoking status: Never    Passive exposure: Never   Smokeless tobacco: Never  Substance Use Topics   Alcohol use: Not Currently    Alcohol/week: 1.0 standard drink of alcohol    Types: 1 Glasses of wine per week    Comment: social    Family History  Problem Relation Age of Onset   Diabetes Father    Heart attack Father    CAD Father    Alcohol abuse Father    Stroke Mother    Diabetes Paternal Grandfather    Heart attack Paternal Grandfather    Hypertension Paternal Grandfather    Alcohol abuse Paternal Grandfather    Lung cancer Maternal Grandfather      Review of Systems  Constitutional:  Negative for chills and fever.  Respiratory:  Negative for cough and shortness of breath.   Cardiovascular:  Negative for chest pain.  Gastrointestinal:  Negative for nausea and vomiting.  Musculoskeletal:  Positive for arthralgias.     Objective:  Physical Exam Well nourished and well developed. General: Alert and oriented x3, cooperative and pleasant, no acute distress. Head: normocephalic, atraumatic, neck supple. Eyes: EOMI.  Musculoskeletal: Right hip exam: Very painful and limited right hip range of motion with hip flexion internal rotation producing significant discomfort. Active hip flexion with weakness and associated external  rotation contracture Whereas her left hip moves more fluidly without significant reproducible pain She does carry a lot of her weight in her midsection No lower extremity edema, erythema or calf tenderness  Bilateral knee exams: No palpable effusions, warmth erythema No significant flexion contractures Tenderness medially more so than laterally Stable medial and lateral collateral ligaments  Calves soft and nontender. Motor function intact in LE. Strength 5/5 LE bilaterally. Neuro: Distal pulses 2+. Sensation to light touch intact in LE.  Vital signs in last 24 hours:    Labs:   Estimated body mass index is 41.52 kg/m as calculated from the following:   Height as of 02/09/23: 5\' 2"  (1.575 m).   Weight as of 02/09/23: 103 kg.   Imaging Review Plain radiographs demonstrate severe degenerative joint disease of the right hip(s). The bone quality appears to be adequate for age and reported activity level.      Assessment/Plan:  End stage arthritis, right hip(s)  The patient history, physical examination, clinical judgement of the provider and imaging studies are consistent with end stage degenerative joint disease of the right hip(s) and total hip arthroplasty is deemed medically necessary. The treatment options including medical management, injection therapy, arthroscopy and arthroplasty were discussed at length. The risks and benefits of total hip arthroplasty were presented and reviewed. The risks due to aseptic loosening, infection, stiffness, dislocation/subluxation,  thromboembolic complications and other imponderables were discussed.  The patient acknowledged the explanation, agreed to proceed with the plan and consent was signed. Patient is being admitted for inpatient treatment for surgery, pain control, PT, OT, prophylactic antibiotics, VTE prophylaxis, progressive ambulation and ADL's and discharge planning.The patient is planning to be discharged  home.  Therapy Plans:  HEP Disposition: Home with husband (son at home) Planned DVT Prophylaxis: aspirin 81mg  BID DME needed: none PCP: Dr. Jeannetta Nap, clearance received TXA: IV Allergies: adhesives - rash, aspirin - abdominal pain, clarithromycin - diarrhea, PCN G - hives Anesthesia Concerns: none BMI: 42.4 Last HgbA1c: Not diabetic  Other: - bilateral knee OA - had injections today - sensitive skin > but  will try aquacel dressing - Chronic pain > oxycodone 10 QID - ABX for a week - Hx of sleeve gastrectomy -- takes prilosec with NSAIDs per GI - Tizanidine, hydromorphone, tylenol, meloxicam ~  Rosalene Billings, PA-C Orthopedic Surgery [facility] [place] [phone]

## 2023-02-21 ENCOUNTER — Observation Stay (HOSPITAL_COMMUNITY)
Admission: RE | Admit: 2023-02-21 | Discharge: 2023-02-22 | Disposition: A | Discharge status: Home / Self Care | Payer: BC Managed Care – PPO | Source: Ambulatory Visit | Attending: Orthopedic Surgery | Admitting: Orthopedic Surgery

## 2023-02-21 ENCOUNTER — Ambulatory Visit (HOSPITAL_COMMUNITY): Payer: BC Managed Care – PPO

## 2023-02-21 ENCOUNTER — Ambulatory Visit (HOSPITAL_COMMUNITY): Payer: BC Managed Care – PPO | Admitting: Certified Registered Nurse Anesthetist

## 2023-02-21 ENCOUNTER — Encounter (HOSPITAL_COMMUNITY): Payer: Self-pay | Admitting: Orthopedic Surgery

## 2023-02-21 ENCOUNTER — Other Ambulatory Visit: Payer: Self-pay

## 2023-02-21 ENCOUNTER — Encounter (HOSPITAL_COMMUNITY)
Admission: RE | Disposition: A | Discharge status: Home / Self Care | Payer: Self-pay | Source: Ambulatory Visit | Attending: Orthopedic Surgery

## 2023-02-21 DIAGNOSIS — M1611 Unilateral primary osteoarthritis, right hip: Principal | ICD-10-CM | POA: Insufficient documentation

## 2023-02-21 DIAGNOSIS — Z96641 Presence of right artificial hip joint: Secondary | ICD-10-CM

## 2023-02-21 HISTORY — PX: TOTAL HIP ARTHROPLASTY: SHX124

## 2023-02-21 SURGERY — ARTHROPLASTY, HIP, TOTAL, ANTERIOR APPROACH
Anesthesia: Monitor Anesthesia Care | Site: Hip | Laterality: Right

## 2023-02-21 MED ORDER — MIDAZOLAM HCL 2 MG/2ML IJ SOLN
INTRAMUSCULAR | Status: AC
  Filled 2023-02-21: qty 2

## 2023-02-21 MED ORDER — LACTATED RINGERS IV SOLN: INTRAVENOUS | Status: DC

## 2023-02-21 MED ORDER — ACETAMINOPHEN 500 MG PO TABS
1000.0000 mg | ORAL_TABLET | Freq: Four times a day (QID) | ORAL | Status: DC
  Administered 2023-02-21 – 2023-02-22 (×4): 1000 mg via ORAL
  Filled 2023-02-21 (×4): qty 2

## 2023-02-21 MED ORDER — ONDANSETRON HCL 4 MG/2ML IJ SOLN: 4.0000 mg | Freq: Four times a day (QID) | INTRAMUSCULAR | Status: DC | PRN

## 2023-02-21 MED ORDER — BUPIVACAINE HCL 0.25 % IJ SOLN
INTRAMUSCULAR | Status: AC
  Filled 2023-02-21: qty 1

## 2023-02-21 MED ORDER — CEFAZOLIN SODIUM-DEXTROSE 2-4 GM/100ML-% IV SOLN
2.0000 g | INTRAVENOUS | Status: AC
  Administered 2023-02-21: 2 g via INTRAVENOUS
  Filled 2023-02-21: qty 100

## 2023-02-21 MED ORDER — ASPIRIN 81 MG PO CHEW
81.0000 mg | CHEWABLE_TABLET | Freq: Two times a day (BID) | ORAL | Status: DC
  Administered 2023-02-21 – 2023-02-22 (×2): 81 mg via ORAL
  Filled 2023-02-21 (×2): qty 1

## 2023-02-21 MED ORDER — SODIUM CHLORIDE 0.9 % IV SOLN: INTRAVENOUS | Status: DC

## 2023-02-21 MED ORDER — FENTANYL CITRATE (PF) 100 MCG/2ML IJ SOLN
INTRAMUSCULAR | Status: DC | PRN
  Administered 2023-02-21: 100 ug via INTRAVENOUS

## 2023-02-21 MED ORDER — MENTHOL 3 MG MT LOZG: 1.0000 | LOZENGE | OROMUCOSAL | Status: DC | PRN

## 2023-02-21 MED ORDER — TRAZODONE HCL 50 MG PO TABS: 25.0000 mg | ORAL_TABLET | Freq: Every evening | ORAL | Status: DC | PRN

## 2023-02-21 MED ORDER — TOPIRAMATE 25 MG PO TABS
50.0000 mg | ORAL_TABLET | Freq: Two times a day (BID) | ORAL | Status: DC
  Administered 2023-02-21 – 2023-02-22 (×2): 50 mg via ORAL
  Filled 2023-02-21 (×2): qty 2

## 2023-02-21 MED ORDER — MIDAZOLAM HCL 5 MG/5ML IJ SOLN
INTRAMUSCULAR | Status: DC | PRN
  Administered 2023-02-21: 2 mg via INTRAVENOUS

## 2023-02-21 MED ORDER — HYDROMORPHONE HCL 1 MG/ML IJ SOLN: 0.5000 mg | INTRAMUSCULAR | Status: DC | PRN

## 2023-02-21 MED ORDER — HYDROMORPHONE HCL 2 MG PO TABS
2.0000 mg | ORAL_TABLET | ORAL | Status: DC | PRN
  Administered 2023-02-21 (×2): 2 mg via ORAL
  Administered 2023-02-21 – 2023-02-22 (×3): 4 mg via ORAL
  Filled 2023-02-21: qty 1
  Filled 2023-02-21 (×4): qty 2

## 2023-02-21 MED ORDER — KETOROLAC TROMETHAMINE 30 MG/ML IJ SOLN
INTRAMUSCULAR | Status: AC
  Filled 2023-02-21: qty 1

## 2023-02-21 MED ORDER — TIZANIDINE HCL 4 MG PO TABS
4.0000 mg | ORAL_TABLET | Freq: Three times a day (TID) | ORAL | Status: DC
  Administered 2023-02-21 – 2023-02-22 (×4): 4 mg via ORAL
  Filled 2023-02-21 (×4): qty 1

## 2023-02-21 MED ORDER — DEXAMETHASONE SODIUM PHOSPHATE 10 MG/ML IJ SOLN
10.0000 mg | Freq: Once | INTRAMUSCULAR | Status: AC
  Administered 2023-02-22: 10 mg via INTRAVENOUS
  Filled 2023-02-21: qty 1

## 2023-02-21 MED ORDER — FENTANYL CITRATE (PF) 100 MCG/2ML IJ SOLN
INTRAMUSCULAR | Status: AC
  Filled 2023-02-21: qty 2

## 2023-02-21 MED ORDER — ORAL CARE MOUTH RINSE: 15.0000 mL | Freq: Once | OROMUCOSAL | Status: AC

## 2023-02-21 MED ORDER — POLYMYXIN B-TRIMETHOPRIM 10000-0.1 UNIT/ML-% OP SOLN
1.0000 [drp] | Freq: Four times a day (QID) | OPHTHALMIC | Status: AC
  Administered 2023-02-21 – 2023-02-22 (×4): 1 [drp] via OPHTHALMIC

## 2023-02-21 MED ORDER — HYDROMORPHONE HCL 2 MG PO TABS: 1.0000 mg | ORAL_TABLET | ORAL | Status: DC | PRN

## 2023-02-21 MED ORDER — METOCLOPRAMIDE HCL 5 MG PO TABS: 5.0000 mg | ORAL_TABLET | Freq: Three times a day (TID) | ORAL | Status: DC | PRN

## 2023-02-21 MED ORDER — DULOXETINE HCL 60 MG PO CPEP
60.0000 mg | ORAL_CAPSULE | Freq: Every day | ORAL | Status: DC
  Administered 2023-02-21: 60 mg via ORAL
  Filled 2023-02-21: qty 1

## 2023-02-21 MED ORDER — RIMEGEPANT SULFATE 75 MG PO TBDP: 75.0000 mg | ORAL_TABLET | ORAL | Status: DC | PRN

## 2023-02-21 MED ORDER — KETOROLAC TROMETHAMINE 0.5 % OP SOLN
1.0000 [drp] | Freq: Four times a day (QID) | OPHTHALMIC | Status: AC
  Administered 2023-02-21 – 2023-02-22 (×4): 1 [drp] via OPHTHALMIC

## 2023-02-21 MED ORDER — CEFAZOLIN SODIUM-DEXTROSE 2-4 GM/100ML-% IV SOLN
2.0000 g | Freq: Four times a day (QID) | INTRAVENOUS | Status: AC
  Administered 2023-02-21 (×2): 2 g via INTRAVENOUS
  Filled 2023-02-21 (×2): qty 100

## 2023-02-21 MED ORDER — OXYCODONE HCL 5 MG/5ML PO SOLN: 5.0000 mg | Freq: Once | ORAL | Status: DC | PRN

## 2023-02-21 MED ORDER — KETOROLAC TROMETHAMINE 30 MG/ML IJ SOLN
INTRAMUSCULAR | Status: DC | PRN
  Administered 2023-02-21: 30 mg

## 2023-02-21 MED ORDER — ONDANSETRON HCL 4 MG PO TABS: 4.0000 mg | ORAL_TABLET | Freq: Four times a day (QID) | ORAL | Status: DC | PRN

## 2023-02-21 MED ORDER — HYDRALAZINE HCL 10 MG PO TABS: 20.0000 mg | ORAL_TABLET | Freq: Every day | ORAL | Status: DC | PRN

## 2023-02-21 MED ORDER — KETOROLAC TROMETHAMINE 15 MG/ML IJ SOLN
7.5000 mg | Freq: Four times a day (QID) | INTRAMUSCULAR | Status: AC
  Administered 2023-02-21 (×2): 7.5 mg via INTRAVENOUS
  Filled 2023-02-21 (×2): qty 1

## 2023-02-21 MED ORDER — PHENOL 1.4 % MT LIQD: 1.0000 | OROMUCOSAL | Status: DC | PRN

## 2023-02-21 MED ORDER — HYDROMORPHONE HCL 1 MG/ML IJ SOLN: 0.2500 mg | INTRAMUSCULAR | Status: DC | PRN

## 2023-02-21 MED ORDER — DIETHYLPROPION HCL ER 75 MG PO TB24: 75.0000 mg | ORAL_TABLET | Freq: Every day | ORAL | Status: DC

## 2023-02-21 MED ORDER — CHLORHEXIDINE GLUCONATE 0.12 % MT SOLN
15.0000 mL | Freq: Once | OROMUCOSAL | Status: AC
  Administered 2023-02-21: 15 mL via OROMUCOSAL

## 2023-02-21 MED ORDER — BSS IO SOLN
15.0000 mL | Freq: Once | INTRAOCULAR | Status: DC
  Administered 2023-02-21: 15 mL
  Filled 2023-02-21: qty 15

## 2023-02-21 MED ORDER — POVIDONE-IODINE 10 % EX SWAB: 2.0000 | Freq: Once | CUTANEOUS | Status: DC

## 2023-02-21 MED ORDER — POLYMYXIN B-TRIMETHOPRIM 10000-0.1 UNIT/ML-% OP SOLN
1.0000 [drp] | Freq: Four times a day (QID) | OPHTHALMIC | Status: DC
  Filled 2023-02-21: qty 10

## 2023-02-21 MED ORDER — SODIUM CHLORIDE (PF) 0.9 % IJ SOLN
INTRAMUSCULAR | Status: DC | PRN
  Administered 2023-02-21: 30 mL

## 2023-02-21 MED ORDER — PROPOFOL 500 MG/50ML IV EMUL
INTRAVENOUS | Status: DC | PRN
  Administered 2023-02-21: 75 ug/kg/min via INTRAVENOUS

## 2023-02-21 MED ORDER — EPINEPHRINE PF 1 MG/ML IJ SOLN
INTRAMUSCULAR | Status: AC
  Filled 2023-02-21: qty 1

## 2023-02-21 MED ORDER — POLYETHYLENE GLYCOL 3350 17 G PO PACK
17.0000 g | PACK | Freq: Two times a day (BID) | ORAL | Status: DC
  Administered 2023-02-22: 17 g via ORAL
  Filled 2023-02-21: qty 1

## 2023-02-21 MED ORDER — DIPHENHYDRAMINE HCL 12.5 MG/5ML PO ELIX: 12.5000 mg | ORAL_SOLUTION | ORAL | Status: DC | PRN

## 2023-02-21 MED ORDER — METHOCARBAMOL 1000 MG/10ML IJ SOLN: 500.0000 mg | Freq: Four times a day (QID) | INTRAVENOUS | Status: DC | PRN

## 2023-02-21 MED ORDER — KETOROLAC TROMETHAMINE 0.5 % OP SOLN
1.0000 [drp] | Freq: Four times a day (QID) | OPHTHALMIC | Status: DC
  Filled 2023-02-21: qty 5

## 2023-02-21 MED ORDER — OXYCODONE HCL 5 MG PO TABS: 5.0000 mg | ORAL_TABLET | Freq: Once | ORAL | Status: DC | PRN

## 2023-02-21 MED ORDER — BUPIVACAINE-EPINEPHRINE (PF) 0.25% -1:200000 IJ SOLN
INTRAMUSCULAR | Status: DC | PRN
  Administered 2023-02-21: 30 mL

## 2023-02-21 MED ORDER — BISACODYL 10 MG RE SUPP: 10.0000 mg | Freq: Every day | RECTAL | Status: DC | PRN

## 2023-02-21 MED ORDER — TRANEXAMIC ACID-NACL 1000-0.7 MG/100ML-% IV SOLN
1000.0000 mg | INTRAVENOUS | Status: AC
  Administered 2023-02-21: 1000 mg via INTRAVENOUS
  Filled 2023-02-21: qty 100

## 2023-02-21 MED ORDER — METOCLOPRAMIDE HCL 5 MG/ML IJ SOLN: 5.0000 mg | Freq: Three times a day (TID) | INTRAMUSCULAR | Status: DC | PRN

## 2023-02-21 MED ORDER — 0.9 % SODIUM CHLORIDE (POUR BTL) OPTIME
TOPICAL | Status: DC | PRN
  Administered 2023-02-21: 1000 mL

## 2023-02-21 MED ORDER — BUPIVACAINE IN DEXTROSE 0.75-8.25 % IT SOLN
INTRATHECAL | Status: DC | PRN
  Administered 2023-02-21: 2 mL via INTRATHECAL

## 2023-02-21 MED ORDER — DOCUSATE SODIUM 100 MG PO CAPS
100.0000 mg | ORAL_CAPSULE | Freq: Two times a day (BID) | ORAL | Status: DC
  Administered 2023-02-21 – 2023-02-22 (×2): 100 mg via ORAL
  Filled 2023-02-21 (×2): qty 1

## 2023-02-21 MED ORDER — METHOCARBAMOL 500 MG PO TABS
500.0000 mg | ORAL_TABLET | Freq: Four times a day (QID) | ORAL | Status: DC | PRN
  Administered 2023-02-21 – 2023-02-22 (×2): 500 mg via ORAL
  Filled 2023-02-21 (×2): qty 1

## 2023-02-21 MED ORDER — TRANEXAMIC ACID-NACL 1000-0.7 MG/100ML-% IV SOLN
1000.0000 mg | Freq: Once | INTRAVENOUS | Status: AC
  Administered 2023-02-21: 1000 mg via INTRAVENOUS
  Filled 2023-02-21: qty 100

## 2023-02-21 MED ORDER — ONDANSETRON HCL 4 MG/2ML IJ SOLN
INTRAMUSCULAR | Status: DC | PRN
  Administered 2023-02-21: 4 mg via INTRAVENOUS

## 2023-02-21 MED ORDER — GLYCOPYRROLATE 0.2 MG/ML IJ SOLN
INTRAMUSCULAR | Status: DC | PRN
  Administered 2023-02-21: .2 mg via INTRAVENOUS

## 2023-02-21 MED ORDER — PROMETHAZINE HCL 25 MG/ML IJ SOLN
6.2500 mg | INTRAMUSCULAR | Status: DC | PRN
  Administered 2023-02-21: 6.25 mg via INTRAVENOUS

## 2023-02-21 MED ORDER — GABAPENTIN 300 MG PO CAPS
600.0000 mg | ORAL_CAPSULE | Freq: Four times a day (QID) | ORAL | Status: DC
  Administered 2023-02-21 – 2023-02-22 (×4): 600 mg via ORAL
  Filled 2023-02-21 (×4): qty 2

## 2023-02-21 MED ORDER — SODIUM CHLORIDE (PF) 0.9 % IJ SOLN
INTRAMUSCULAR | Status: AC
  Filled 2023-02-21: qty 50

## 2023-02-21 MED ORDER — STERILE WATER FOR IRRIGATION IR SOLN
Status: DC | PRN
  Administered 2023-02-21: 2000 mL

## 2023-02-21 MED ORDER — DEXAMETHASONE SODIUM PHOSPHATE 10 MG/ML IJ SOLN
8.0000 mg | Freq: Once | INTRAMUSCULAR | Status: AC
  Administered 2023-02-21: 8 mg via INTRAVENOUS

## 2023-02-21 MED ORDER — PROMETHAZINE HCL 25 MG/ML IJ SOLN
INTRAMUSCULAR | Status: AC
  Filled 2023-02-21: qty 1

## 2023-02-21 MED ORDER — PANTOPRAZOLE SODIUM 40 MG PO TBEC
40.0000 mg | DELAYED_RELEASE_TABLET | Freq: Every day | ORAL | Status: DC
  Administered 2023-02-22: 40 mg via ORAL
  Filled 2023-02-21: qty 1

## 2023-02-21 SURGICAL SUPPLY — 39 items
ADH SKN CLS APL DERMABOND .7 (GAUZE/BANDAGES/DRESSINGS) ×1
BAG COUNTER SPONGE SURGICOUNT (BAG) IMPLANT
BAG SPNG CNTER NS LX DISP (BAG)
BLADE SAG 18X100X1.27 (BLADE) ×2 IMPLANT
COVER PERINEAL POST (MISCELLANEOUS) ×2 IMPLANT
COVER SURGICAL LIGHT HANDLE (MISCELLANEOUS) ×2 IMPLANT
CUP ACETBLR 52 OD PINNACLE (Hips) IMPLANT
DERMABOND ADVANCED .7 DNX12 (GAUZE/BANDAGES/DRESSINGS) ×2 IMPLANT
DRAPE FOOT SWITCH (DRAPES) ×2 IMPLANT
DRAPE STERI IOBAN 125X83 (DRAPES) ×2 IMPLANT
DRAPE U-SHAPE 47X51 STRL (DRAPES) ×4 IMPLANT
DRESSING AQUACEL AG SP 3.5X10 (GAUZE/BANDAGES/DRESSINGS) ×2 IMPLANT
DRSG AQUACEL AG SP 3.5X10 (GAUZE/BANDAGES/DRESSINGS) ×1
DURAPREP 26ML APPLICATOR (WOUND CARE) ×2 IMPLANT
ELECT REM PT RETURN 15FT ADLT (MISCELLANEOUS) ×2 IMPLANT
GLOVE BIO SURGEON STRL SZ 6 (GLOVE) ×2 IMPLANT
GLOVE BIOGEL PI IND STRL 6.5 (GLOVE) ×2 IMPLANT
GLOVE BIOGEL PI IND STRL 7.5 (GLOVE) ×2 IMPLANT
GLOVE ORTHO TXT STRL SZ7.5 (GLOVE) ×4 IMPLANT
GOWN STRL REUS W/ TWL LRG LVL3 (GOWN DISPOSABLE) ×4 IMPLANT
GOWN STRL REUS W/TWL LRG LVL3 (GOWN DISPOSABLE) ×2
HEAD CERAMIC DELTA 36 PLUS 1.5 (Hips) IMPLANT
HOLDER FOLEY CATH W/STRAP (MISCELLANEOUS) ×2 IMPLANT
KIT TURNOVER KIT A (KITS) IMPLANT
LINER NEUTRAL 52X36MM PLUS 4 (Liner) IMPLANT
NDL SAFETY ECLIP 18X1.5 (MISCELLANEOUS) IMPLANT
PACK ANTERIOR HIP CUSTOM (KITS) ×2 IMPLANT
SCREW 6.5MMX35MM (Screw) IMPLANT
STEM FEMORAL SZ6 HIGH ACTIS (Stem) IMPLANT
SUT MNCRL AB 4-0 PS2 18 (SUTURE) ×2 IMPLANT
SUT STRATAFIX 0 PDS 27 VIOLET (SUTURE) ×1
SUT VIC AB 1 CT1 36 (SUTURE) ×6 IMPLANT
SUT VIC AB 2-0 CT1 27 (SUTURE) ×2
SUT VIC AB 2-0 CT1 TAPERPNT 27 (SUTURE) ×4 IMPLANT
SUTURE STRATFX 0 PDS 27 VIOLET (SUTURE) ×2 IMPLANT
SYR 3ML LL SCALE MARK (SYRINGE) IMPLANT
TRAY FOLEY MTR SLVR 16FR STAT (SET/KITS/TRAYS/PACK) IMPLANT
TUBE SUCTION HIGH CAP CLEAR NV (SUCTIONS) ×2 IMPLANT
WATER STERILE IRR 1000ML POUR (IV SOLUTION) ×2 IMPLANT

## 2023-02-21 NOTE — Progress Notes (Signed)
Notified MDA patient c/o nausea.  HR 47.  And patient sleepy.  Just wanted to make sure he wanted to give promethazine that he ordered or give something else.  MDA wants to give current ordered medication.  Will continue to monitor and notify for further changes.

## 2023-02-21 NOTE — Progress Notes (Signed)
MDA notified patients HR drops down to 40 when sleeping.  But pops back up when patient awake. Just wanted to make sure MDA is ok with taking patient to room.  No new orders and MDA ok with sending patient to the flood.

## 2023-02-21 NOTE — Interval H&P Note (Signed)
History and Physical Interval Note:  02/21/2023 6:56 AM  Tina Hunt  has presented today for surgery, with the diagnosis of Right hip avascular necrosis.  The various methods of treatment have been discussed with the patient and family. After consideration of risks, benefits and other options for treatment, the patient has consented to  Procedure(s): TOTAL HIP ARTHROPLASTY ANTERIOR APPROACH (Right) as a surgical intervention.  The patient's history has been reviewed, patient examined, no change in status, stable for surgery.  I have reviewed the patient's chart and labs.  Questions were answered to the patient's satisfaction.     [name]

## 2023-02-21 NOTE — Transfer of Care (Signed)
Immediate Anesthesia Transfer of Care Note  Patient: Tina Hunt  Procedure(s) Performed: TOTAL HIP ARTHROPLASTY ANTERIOR APPROACH (Right: Hip)  Patient Location: PACU  Anesthesia Type:Spinal  Level of Consciousness: sedated, patient cooperative, and responds to stimulation  Airway & Oxygen Therapy: Patient Spontanous Breathing and Patient connected to face mask oxygen  Post-op Assessment: Report given to RN and Post -op Vital signs reviewed and stable  Post vital signs: Reviewed and stable  Last Vitals:  Vitals Value Taken Time  BP 85/50 02/21/23 1018  Temp    Pulse 51 02/21/23 1020  Resp 14 02/21/23 1020  SpO2 99 % 02/21/23 1020  Vitals shown include unvalidated device data.  Last Pain:  Vitals:   02/21/23 0659  TempSrc:   PainSc: 8       Patients Stated Pain Goal: 7 (02/21/23 0659)  Complications: No notable events documented.

## 2023-02-21 NOTE — Anesthesia Postprocedure Evaluation (Signed)
Anesthesia Post Note  Patient: Tina Hunt  Procedure(s) Performed: TOTAL HIP ARTHROPLASTY ANTERIOR APPROACH (Right: Hip)     Patient location during evaluation: PACU Anesthesia Type: MAC Level of consciousness: awake and alert Pain management: pain level controlled Vital Signs Assessment: post-procedure vital signs reviewed and stable Respiratory status: spontaneous breathing, nonlabored ventilation and respiratory function stable Cardiovascular status: blood pressure returned to baseline and stable Postop Assessment: no apparent nausea or vomiting Anesthetic complications: no   No notable events documented.  Last Vitals:  Vitals:   02/21/23 1145 02/21/23 1208  BP: 106/74 133/73  Pulse: (!) 45 (!) 48  Resp: 12 18  Temp:  36.7 C  SpO2: 99% 100%    Last Pain:  Vitals:   02/21/23 1208  TempSrc: Oral  PainSc: 0-No pain                 Lowella Curb

## 2023-02-21 NOTE — Anesthesia Procedure Notes (Signed)
Spinal  Patient location during procedure: OR Start time: 02/21/2023 8:41 AM End time: 02/21/2023 8:46 AM Reason for block: surgical anesthesia Staffing Performed: anesthesiologist  Anesthesiologist: Lowella Curb, MD Performed by: Lowella Curb, MD Authorized by: Lowella Curb, MD   Preanesthetic Checklist Completed: patient identified, IV checked, site marked, risks and benefits discussed, surgical consent, monitors and equipment checked, pre-op evaluation and timeout performed Spinal Block Patient position: sitting Prep: DuraPrep Patient monitoring: heart rate, cardiac monitor, continuous pulse ox and blood pressure Approach: midline Location: L3-4 Injection technique: single-shot Needle Needle type: Sprotte  Needle gauge: 24 G Needle length: 9 cm Assessment Sensory level: T4 Events: CSF return and second provider Additional Notes First attempt by CRNA

## 2023-02-21 NOTE — Op Note (Signed)
NAME:  Tina Hunt                ACCOUNT NO.: [id number]      MEDICAL RECORD NO.: [id number]      FACILITY:  [facility]      PHYSICIAN:  [name]  DATE OF BIRTH:  [birth year]     DATE OF PROCEDURE:  02/21/2023                                 OPERATIVE REPORT         PREOPERATIVE DIAGNOSIS: Right  hip osteoarthritis.      POSTOPERATIVE DIAGNOSIS:  Right hip osteoarthritis.      PROCEDURE:  Right total hip replacement through an anterior approach   utilizing DePuy THR system, component size 52 mm pinnacle cup, a size 36+4 neutral   Altrex liner, a size 6 Hi Actis stem with a 36+1.5 delta ceramic   ball.      SURGEON:  Madlyn Frankel. Charlann Boxer, M.D.      ASSISTANT:  Rosalene Billings, PA-C     ANESTHESIA:  Spinal.      SPECIMENS:  None.      COMPLICATIONS:  None.      BLOOD LOSS:  500 cc     DRAINS:  None.      INDICATION OF THE PROCEDURE:  Tina Hunt is a 47 y.o. female who had   presented to office for evaluation of right hip pain.  Radiographs revealed   progressive degenerative changes with bone-on-bone   articulation of the  hip joint, including subchondral cystic changes and osteophytes.  The patient had painful limited range of   motion significantly affecting their overall quality of life and function.  The patient was failing to    respond to conservative measures including medications and/or injections and activity modification and at this point was ready   to proceed with more definitive measures.  Consent was obtained for   benefit of pain relief.  Specific risks of infection, DVT, component   failure, dislocation, neurovascular injury, and need for revision surgery were reviewed in the office.     PROCEDURE IN DETAIL:  The patient was brought to operative theater.   Once adequate anesthesia, preoperative antibiotics, 2 gm of Ancef, 1 gm of Tranexamic Acid, and 10 mg of Decadron were administered, the patient was positioned supine on  the [employer] table.  Once the patient was safely positioned with adequate padding of boney prominences we predraped out the hip, and used fluoroscopy to confirm orientation of the pelvis.      The right hip was then prepped and draped from proximal iliac crest to   mid thigh with a shower curtain technique.      Time-out was performed identifying the patient, planned procedure, and the appropriate extremity.     An incision was then made 2 cm lateral to the   anterior superior iliac spine extending over the orientation of the   tensor fascia lata muscle and sharp dissection was carried down to the   fascia of the muscle.      The fascia was then incised.  The muscle belly was identified and swept   laterally and retractor placed along the superior neck.  Following   cauterization of the circumflex vessels and removing some pericapsular   fat, a second cobra retractor was placed on the inferior  neck.  A T-capsulotomy was made along the line of the   superior neck to the trochanteric fossa, then extended proximally and   distally.  Tag sutures were placed and the retractors were then placed   intracapsular.  We then identified the trochanteric fossa and   orientation of my neck cut and then made a neck osteotomy with the femur on traction.  The femoral   head was removed without difficulty or complication.  Traction was let   off and retractors were placed posterior and anterior around the   acetabulum.      The labrum and foveal tissue were debrided.  I began reaming with a 46 mm   reamer and reamed up to 51 mm reamer with good bony bed preparation and a 52 mm  cup was chosen.  The final 52 mm Pinnacle cup was then impacted under fluoroscopy to confirm the depth of penetration and orientation with respect to   Abduction and forward flexion.  A screw was placed into the ilium followed by the hole eliminator.  The final   36+4 neutral Altrex liner was impacted with good visualized rim fit.   The cup was positioned anatomically within the acetabular portion of the pelvis.      At this point, the femur was rolled to 100 degrees.  Further capsule was   released off the inferior aspect of the femoral neck.  I then   released the superior capsule proximally.  With the leg in a neutral position the hook was placed laterally   along the femur under the vastus lateralis origin and elevated manually and then held in position using the hook attachment on the bed.  The leg was then extended and adducted with the leg rolled to 100   degrees of external rotation.  Retractors were placed along the medial calcar and posteriorly over the greater trochanter.  Once the proximal femur was fully   exposed, I used a box osteotome to set orientation.  I then began   broaching with the starting chili pepper broach and passed this by hand and then broached up to 6.  With the 6 broach in place I chose a high offset neck and did several trial reductions.  The offset was appropriate, leg lengths   appeared to be equal best matched with the +1.5 head ball trial confirmed radiographically.   Given these findings, I went ahead and dislocated the hip, repositioned all   retractors and positioned the right hip in the extended and abducted position.  The final 6 Hi Actis stem was   chosen and it was impacted down to the level of neck cut.  Based on this   and the trial reductions, a final 36+1.5 delta ceramic ball was chosen and   impacted onto a clean and dry trunnion, and the hip was reduced.  The   hip had been irrigated throughout the case again at this point.  I did   reapproximate the superior capsular leaflet to the anterior leaflet   using #1 Vicryl.  The fascia of the   tensor fascia lata muscle was then reapproximated using #1 Vicryl and #0 Stratafix sutures.  The   remaining wound was closed with 2-0 Vicryl and running 4-0 Monocryl.   The hip was cleaned, dried, and dressed sterilely using Dermabond and    Aquacel dressing.  The patient was then brought   to recovery room in stable condition tolerating the procedure well.    [name]  Domenic Schwab, PA-C was present for the entirety of the case involved from   preoperative positioning, perioperative retractor management, general   facilitation of the case, as well as primary wound closure as assistant.            Madlyn Frankel Charlann Boxer, M.D.        02/21/2023 8:42 AM

## 2023-02-21 NOTE — Anesthesia Preprocedure Evaluation (Signed)
Anesthesia Evaluation  Patient identified by MRN, date of birth, ID band Patient awake    Reviewed: Allergy & Precautions, NPO status , Patient's Chart, lab work & pertinent test results, reviewed documented beta blocker date and time   History of Anesthesia Complications (+) PONV and history of anesthetic complications  Airway Mallampati: II  TM Distance: >3 FB Neck ROM: Full    Dental  (+) Teeth Intact, Dental Advisory Given   Pulmonary sleep apnea    Pulmonary exam normal breath sounds clear to auscultation       Cardiovascular negative cardio ROS Normal cardiovascular exam Rhythm:Regular Rate:Normal     Neuro/Psych  Headaches PSYCHIATRIC DISORDERS Anxiety Depression       GI/Hepatic Neg liver ROS,GERD  Medicated,,  Endo/Other    Morbid obesity  Renal/GU negative Renal ROS     Musculoskeletal  (+) Arthritis , Osteoarthritis,  right foot hallux varus, sprain of metatarsophalangeal joint of the right great toe   Abdominal  (+) + obese  Peds  Hematology negative hematology ROS (+)   Anesthesia Other Findings Airway check 09/13/2018: OK to proceed.  Reproductive/Obstetrics                             Anesthesia Physical Anesthesia Plan  ASA: III  Anesthesia Plan: MAC and Spinal   Post-op Pain Management:  Regional for Post-op pain   Induction: Intravenous  PONV Risk Score and Plan: 3 and Midazolam, Dexamethasone, Ondansetron and Treatment may vary due to age or medical condition  Airway Management Planned: Simple Face Mask  Additional Equipment:   Intra-op Plan:   Post-operative Plan:   Informed Consent: I have reviewed the patients History and Physical, chart, labs and discussed the procedure including the risks, benefits and alternatives for the proposed anesthesia with the patient or authorized representative who has indicated his/her understanding and acceptance.      Dental advisory given  Plan Discussed with: CRNA  Anesthesia Plan Comments:         Anesthesia Quick Evaluation
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
## 2023-02-21 NOTE — Progress Notes (Signed)
MDA at bedside.  Patient doing well but c/o eye pain.  Feels like something is in her eye.  No new orders at this time.

## 2023-02-21 NOTE — Discharge Instructions (Signed)

## 2023-02-21 NOTE — Progress Notes (Signed)
Notififed MDA patients HR in the 40's and BP 96/61.  No new orders at this time.  Will continue to monitor.

## 2023-02-21 NOTE — Progress Notes (Signed)
Verified with Rosalene Billings, PA that it is okay for patient to take Aspirin for VTE prophylaxis. Reports that this was already discussed with patient. Haydee Salter, RN 02/21/23 3:47 PM

## 2023-02-22 DIAGNOSIS — M1611 Unilateral primary osteoarthritis, right hip: Secondary | ICD-10-CM | POA: Diagnosis not present

## 2023-02-22 LAB — BASIC METABOLIC PANEL
Anion gap: 5 (ref 5–15)
BUN: 19 mg/dL (ref 6–20)
CO2: 23 mmol/L (ref 22–32)
Calcium: 8.7 mg/dL — ABNORMAL LOW (ref 8.9–10.3)
Chloride: 109 mmol/L (ref 98–111)
Creatinine, Ser: 0.73 mg/dL (ref 0.44–1.00)
GFR, Estimated: >60 mL/min (ref >60)
Glucose, Bld: 134 mg/dL — ABNORMAL HIGH (ref 70–99)
Potassium: 4 mmol/L (ref 3.5–5.1)
Sodium: 137 mmol/L (ref 135–145)

## 2023-02-22 LAB — CBC
HCT: 30.1 % — ABNORMAL LOW (ref 36.0–46.0)
Hemoglobin: 10.2 g/dL — ABNORMAL LOW (ref 12.0–15.0)
MCH: 34.1 pg — ABNORMAL HIGH (ref 26.0–34.0)
MCHC: 33.9 g/dL (ref 30.0–36.0)
MCV: 100.7 fL — ABNORMAL HIGH (ref 80.0–100.0)
Platelets: 204 10*3/uL (ref 150–400)
RBC: 2.99 MIL/uL — ABNORMAL LOW (ref 3.87–5.11)
RDW: 12.7 % (ref 11.5–15.5)
WBC: 15.1 10*3/uL — ABNORMAL HIGH (ref 4.0–10.5)
nRBC: 0 % (ref 0.0–0.2)

## 2023-02-22 MED ORDER — POLYETHYLENE GLYCOL 3350 17 G PO PACK: 17.0000 g | PACK | Freq: Two times a day (BID) | ORAL | 0 refills | Status: AC

## 2023-02-22 MED ORDER — ASPIRIN 81 MG PO CHEW: 81.0000 mg | CHEWABLE_TABLET | Freq: Two times a day (BID) | ORAL | 0 refills | Status: AC

## 2023-02-22 MED ORDER — MELOXICAM 15 MG PO TABS: 15.0000 mg | ORAL_TABLET | Freq: Every day | ORAL | 0 refills | Status: AC

## 2023-02-22 MED ORDER — SENNA 8.6 MG PO TABS: 2.0000 | ORAL_TABLET | Freq: Every day | ORAL | 0 refills | Status: AC

## 2023-02-22 MED ORDER — CEFADROXIL 500 MG PO CAPS: 500.0000 mg | ORAL_CAPSULE | Freq: Two times a day (BID) | ORAL | 0 refills | Status: AC

## 2023-02-22 MED ORDER — HYDROMORPHONE HCL 2 MG PO TABS: 2.0000 mg | ORAL_TABLET | ORAL | 0 refills | Status: DC | PRN

## 2023-02-22 NOTE — Progress Notes (Signed)
Subjective: 1 Day Post-Op Procedure(s) (LRB): TOTAL HIP ARTHROPLASTY ANTERIOR APPROACH (Right) Patient reports pain as mild.   Patient seen in rounds with Dr. Charlann Boxer. Patient is resting in bed on exam this morning. No acute events overnight. Foley catheter removed. Patient ambulated a few feet with PT yesterday. She is moving around in the bed well this morning, and notes she slept on her stomach overnight.  We will start therapy today.   Objective: Vital signs in last 24 hours: Temp:  [97.1 F (36.2 C)-98.5 F (36.9 C)] 98.5 F (36.9 C) (05/29 0534) Pulse Rate:  [44-62] 62 (05/29 0534) Resp:  [12-18] 16 (05/29 0534) BP: (85-143)/(50-87) 141/66 (05/29 0534) SpO2:  [96 %-100 %] 100 % (05/29 0534)  Intake/Output from previous day:  Intake/Output Summary (Last 24 hours) at 02/22/2023 0740 Last data filed at 02/22/2023 0600 Gross per 24 hour  Intake 3009.19 ml  Output 3100 ml  Net -90.81 ml     Intake/Output this shift: No intake/output data recorded.  Labs: Recent Labs    02/22/23 0334  HGB 10.2*   Recent Labs    02/22/23 0334  WBC 15.1*  RBC 2.99*  HCT 30.1*  PLT 204   Recent Labs    02/22/23 0334  NA 137  K 4.0  CL 109  CO2 23  BUN 19  CREATININE 0.73  GLUCOSE 134*  CALCIUM 8.7*   No results for input(s): "LABPT", "INR" in the last 72 hours.  Exam: General - Patient is Alert and Oriented Extremity - Neurologically intact Sensation intact distally Intact pulses distally Dorsiflexion/Plantar flexion intact Dressing - dressing C/D/I Motor Function - intact, moving foot and toes well on exam.   Past Medical History:  Diagnosis Date   Anxiety    Arthritis    "a little in Right knee"   Chronic back pain    Depression    Endometriosis    GERD (gastroesophageal reflux disease)    Hallux varus (acquired), right foot    Headache(784.0)    MIGRAINES    History of blood transfusion 01/10/2003   WH - 3 units transfused   with c section   Migraine     Neck pain    clench teeth at night   Neuromuscular disorder (HCC)    BLACKOUT SPELLS  BUT NONE SINCE 1996    Pneumonia    hx of 3 years ago   PONV (postoperative nausea and vomiting)    with tonsils as child, no problems as adult   Recurrent sinusitis    Sleep apnea    Does not use CPAP  due yo weight loss   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   Weight as of this encounter: 103 kg. Advance diet Up with therapy D/C IV fluids  DVT Prophylaxis - Aspirin Weight bearing as tolerated.  Hgb stable at 10.2 this AM. Will give 1 week of abx based on BMI >40.  Plan is to go Home after hospital stay. Plan for discharge today after meeting goals with therapy. Follow up in the office in 2 weeks.   Will have dilaudid delivered based on limited availability elsewhere.   Dennie Bible, PA-C Orthopedic Surgery [phone]  02/22/2023, 7:40 AM
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
## 2023-02-22 NOTE — Plan of Care (Signed)
  Problem: Education: Goal: Knowledge of the prescribed therapeutic regimen will improve Outcome: Adequate for Discharge Goal: Understanding of discharge needs will improve Outcome: Adequate for Discharge Goal: Individualized Educational Video(s) Outcome: Adequate for Discharge   Problem: Activity: Goal: Ability to avoid complications of mobility impairment will improve Outcome: Adequate for Discharge Goal: Ability to tolerate increased activity will improve Outcome: Adequate for Discharge   Problem: Clinical Measurements: Goal: Postoperative complications will be avoided or minimized Outcome: Adequate for Discharge   Problem: Pain Management: Goal: Pain level will decrease with appropriate interventions Outcome: Adequate for Discharge   Problem: Skin Integrity: Goal: Will show signs of wound healing Outcome: Adequate for Discharge   Problem: Education: Goal: Knowledge of General Education information will improve Description: Including pain rating scale, medication(s)/side effects and non-pharmacologic comfort measures Outcome: Adequate for Discharge   Problem: Health Behavior/Discharge Planning: Goal: Ability to manage health-related needs will improve Outcome: Adequate for Discharge   Problem: Clinical Measurements: Goal: Ability to maintain clinical measurements within normal limits will improve Outcome: Adequate for Discharge Goal: Will remain free from infection Outcome: Adequate for Discharge Goal: Diagnostic test results will improve Outcome: Adequate for Discharge Goal: Respiratory complications will improve Outcome: Adequate for Discharge Goal: Cardiovascular complication will be avoided Outcome: Adequate for Discharge   Problem: Activity: Goal: Risk for activity intolerance will decrease Outcome: Adequate for Discharge   Problem: Nutrition: Goal: Adequate nutrition will be maintained Outcome: Adequate for Discharge   Problem: Coping: Goal: Level of  anxiety will decrease Outcome: Adequate for Discharge   Problem: Elimination: Goal: Will not experience complications related to bowel motility Outcome: Adequate for Discharge Goal: Will not experience complications related to urinary retention Outcome: Adequate for Discharge   Problem: Pain Managment: Goal: General experience of comfort will improve Outcome: Adequate for Discharge   Problem: Safety: Goal: Ability to remain free from injury will improve Outcome: Adequate for Discharge   Problem: Skin Integrity: Goal: Risk for impaired skin integrity will decrease Outcome: Adequate for Discharge   Problem: Acute Rehab PT Goals(only PT should resolve) Goal: Pt Will Go Supine/Side To Sit Outcome: Adequate for Discharge Goal: Pt Will Go Sit To Supine/Side Outcome: Adequate for Discharge Goal: Patient Will Transfer Sit To/From Stand Outcome: Adequate for Discharge Goal: Pt Will Ambulate Outcome: Adequate for Discharge Goal: Pt Will Go Up/Down Stairs Outcome: Adequate for Discharge

## 2023-02-22 NOTE — TOC Transition Note (Signed)
Transition of Care [facility]) - CM/SW Discharge Note   Patient Details  Name: Tina Hunt MRN: [id number] Date of Birth: [birth year]  [facility]) CM/SW Contact:  Amada Jupiter, LCSW Phone Number: 02/22/2023, 10:19 AM   Clinical Narrative:     Met with pt and confirming she has needed DME at home.  Plan for HEP.  No TOC needs.  Final next level of care: Home/Self Care Barriers to Discharge: No Barriers Identified   Patient Goals and CMS Choice      Discharge Placement                         Discharge Plan and Services Additional resources added to the After Visit Summary for                  DME Arranged: N/A DME Agency: NA                  Social Determinants of Health (SDOH) Interventions SDOH Screenings   Food Insecurity: Food Insecurity Present (02/21/2023)  Housing: Medium Risk (02/21/2023)  Transportation Needs: No Transportation Needs (02/21/2023)  Utilities: Not At Risk (02/21/2023)  Depression (PHQ2-9): Medium Risk (07/20/2019)  Tobacco Use: Low Risk  (02/21/2023)     Readmission Risk Interventions     No data to display

## 2023-02-27 NOTE — Discharge Summary (Signed)
Patient ID: Tina Hunt MRN: [id number] DOB/AGE: [birth year] 47 y.o.  Admit date: 02/21/2023 Discharge date: 02/22/2023  Admission Diagnoses:  Right hip osteoarthritis  Discharge Diagnoses:  Principal Problem:   S/P total right hip arthroplasty   Past Medical History:  Diagnosis Date   Anxiety    Arthritis    "a little in Right knee"   Chronic back pain    Depression    Endometriosis    GERD (gastroesophageal reflux disease)    Hallux varus (acquired), right foot    Headache(784.0)    MIGRAINES    History of blood transfusion 01/10/2003   [facility] - 3 units transfused   with c section   Migraine    Neck pain    clench teeth at night   Neuromuscular disorder (HCC)    BLACKOUT SPELLS  BUT NONE SINCE 1996    Pneumonia    hx of 3 years ago   PONV (postoperative nausea and vomiting)    with tonsils as child, no problems as adult   Recurrent sinusitis    Sleep apnea    Does not use CPAP  due yo weight loss   Tuberculosis    TB + SKIN TEST, CXR was normal    Surgeries: Procedure(s): TOTAL HIP ARTHROPLASTY ANTERIOR APPROACH on 02/21/2023   Consultants:   Discharged Condition: Improved  Hospital Course: Tina Hunt is an 47 y.o. female who was admitted 02/21/2023 for operative treatment ofS/P total right hip arthroplasty. Patient has severe unremitting pain that affects sleep, daily activities, and work/hobbies. After pre-op clearance the patient was taken to the operating room on 02/21/2023 and underwent  Procedure(s): TOTAL HIP ARTHROPLASTY ANTERIOR APPROACH.    Patient was given perioperative antibiotics:  Anti-infectives (From admission, onward)    Start     Dose/Rate Route Frequency Ordered Stop   02/22/23 0000  cefadroxil (DURICEF) 500 MG capsule        500 mg Oral 2 times daily 02/22/23 0750 03/01/23 2359   02/21/23 1500  ceFAZolin (ANCEF) IVPB 2g/100 mL premix        2 g 200 mL/hr over 30 Minutes Intravenous Every 6 hours 02/21/23 1157 02/21/23 2309    02/21/23 0645  ceFAZolin (ANCEF) IVPB 2g/100 mL premix        2 g 200 mL/hr over 30 Minutes Intravenous On call to O.R. 02/21/23 7829 02/21/23 5621        Patient was given sequential compression devices, early ambulation, and chemoprophylaxis to prevent DVT. Patient worked with PT and was meeting their goals regarding safe ambulation and transfers.  Patient benefited maximally from hospital stay and there were no complications.    Recent vital signs: No data found.   Recent laboratory studies: No results for input(s): "WBC", "HGB", "HCT", "PLT", "NA", "K", "CL", "CO2", "BUN", "CREATININE", "GLUCOSE", "INR", "CALCIUM" in the last 72 hours.  Invalid input(s): "PT", "2"   Discharge Medications:   Allergies as of 02/22/2023       Reactions   Tape Rash   Aspirin Other (See Comments)   Stomach cramping   Oseltamivir Rash   Biaxin [clarithromycin] Diarrhea, Nausea And Vomiting   Nsaids Other (See Comments)   Had gastric bypass, cannot take   Penicillins Hives, Nausea And Vomiting, Rash   Tolerates cephalexin.        Medication List     STOP taking these medications    ibuprofen 200 MG tablet Commonly known as: ADVIL   Oxycodone HCl 10 MG Tabs  TAKE these medications    acetaminophen 500 MG tablet Commonly known as: TYLENOL Take 1,000 mg by mouth every 6 (six) hours as needed for moderate pain.   Ajovy 225 MG/1.5ML Soaj Generic drug: Fremanezumab-vfrm Inject 225 mg into the skin every 30 (thirty) days.   aspirin 81 MG chewable tablet Chew 1 tablet (81 mg total) by mouth 2 (two) times daily for 28 days.   cefadroxil 500 MG capsule Commonly known as: DURICEF Take 1 capsule (500 mg total) by mouth 2 (two) times daily for 7 days.   Cholecalciferol 1.25 MG (50000 UT) capsule Take 50,000 Units by mouth once a week.   Diethylpropion HCl CR 75 MG Tb24 Take 75 mg by mouth daily.   diphenhydrAMINE 25 mg capsule Commonly known as: BENADRYL Take 25 mg by  mouth every 6 (six) hours as needed for allergies.   DULoxetine 30 MG capsule Commonly known as: CYMBALTA Take 60 mg by mouth at bedtime.   gabapentin 100 MG capsule Commonly known as: NEURONTIN Take 100 mg by mouth 3 (three) times daily as needed (pain).   gabapentin 600 MG tablet Commonly known as: NEURONTIN Take 600 mg by mouth 4 (four) times daily.   hydrALAZINE 10 MG tablet Commonly known as: APRESOLINE Take 2 tablets (20 mg total) by mouth as needed. What changed:  when to take this reasons to take this   HYDROmorphone 2 MG tablet Commonly known as: DILAUDID Take 1-2 tablets (2-4 mg total) by mouth every 4 (four) hours as needed for severe pain (pain score 7-10). Do not take oxycodone with this medication   meloxicam 15 MG tablet Commonly known as: MOBIC Take 1 tablet (15 mg total) by mouth daily.   multivitamin tablet Take 1 tablet by mouth daily.   Nurtec 75 MG Tbdp Generic drug: Rimegepant Sulfate Take 75 mg by mouth as needed (Take 1 at onset of headache, max is 1 tablet in 24 hours).   omeprazole 20 MG capsule Commonly known as: PRILOSEC Take 20 mg by mouth 2 (two) times daily before a meal.   ondansetron 4 MG disintegrating tablet Commonly known as: ZOFRAN-ODT DISSOLVE 1 TABLET ON TONGUE EVERY 8 HOURS AS NEEDED.   polyethylene glycol 17 g packet Commonly known as: MIRALAX / GLYCOLAX Take 17 g by mouth 2 (two) times daily.   senna 8.6 MG Tabs tablet Commonly known as: SENOKOT Take 2 tablets (17.2 mg total) by mouth at bedtime for 14 days.   SUMAtriptan 6 MG/0.5ML Soaj Commonly known as: Imitrex STATdose System Inject 0.5 mLs into the skin as needed (use if maxalt fails).   tiZANidine 4 MG tablet Commonly known as: ZANAFLEX Take 4 mg by mouth 3 (three) times daily.   topiramate 50 MG tablet Commonly known as: TOPAMAX Take 50 mg by mouth 2 (two) times daily.   traZODone 50 MG tablet Commonly known as: DESYREL Take 25-100 mg by mouth at  bedtime as needed for sleep.               Discharge Care Instructions  (From admission, onward)           Start     Ordered   02/22/23 0000  Change dressing       Comments: Maintain surgical dressing until follow up in the clinic. If the edges start to pull up, may reinforce with tape. If the dressing is no longer working, may remove and cover with gauze and tape, but must keep the area dry and clean.  Call with any questions or concerns.   02/22/23 0746            Diagnostic Studies: DG Pelvis Portable  Result Date: 02/21/2023 CLINICAL DATA:  Postop right hip. EXAM: PORTABLE PELVIS 1-2 VIEWS COMPARISON:  None Available. FINDINGS: Right hip arthroplasty in expected alignment. No periprosthetic lucency or fracture. Recent postsurgical change includes air and edema in the soft tissues. IMPRESSION: Right hip arthroplasty without immediate postoperative complication. Electronically Signed   By: Narda Rutherford M.D.   On: 02/21/2023 12:37   DG HIP UNILAT WITH PELVIS 1V RIGHT  Result Date: 02/21/2023 CLINICAL DATA:  Intraoperative right anterior hip replacement. Intraoperative fluoroscopy. EXAM: DG HIP (WITH OR WITHOUT PELVIS) 1V RIGHT COMPARISON:  Right hip radiographs 02/05/2018 FINDINGS: Images were performed intraoperatively without the presence of a radiologist. New total right hip arthroplasty hardware. No hardware complication is seen. Total fluoroscopy images: 2 Total fluoroscopy time: 7 seconds Total dose: Radiation Exposure Index (as provided by the fluoroscopic device): 1.659 mGy air Kerma Please see intraoperative findings for further detail. IMPRESSION: Intraoperative fluoroscopy for total right hip arthroplasty. Electronically Signed   By: Neita Garnet M.D.   On: 02/21/2023 10:30   DG C-Arm 1-60 Min-No Report  Result Date: 02/21/2023 Fluoroscopy was utilized by the requesting physician.  No radiographic interpretation.    Disposition: Discharge disposition: 01-Home  or Self Care       Discharge Instructions     Call MD / Call 911   Complete by: As directed    If you experience chest pain or shortness of breath, CALL 911 and be transported to the hospital emergency room.  If you develope a fever above 101 F, pus (white drainage) or increased drainage or redness at the wound, or calf pain, call your surgeon's office.   Change dressing   Complete by: As directed    Maintain surgical dressing until follow up in the clinic. If the edges start to pull up, may reinforce with tape. If the dressing is no longer working, may remove and cover with gauze and tape, but must keep the area dry and clean.  Call with any questions or concerns.   Constipation Prevention   Complete by: As directed    Drink plenty of fluids.  Prune juice may be helpful.  You may use a stool softener, such as Colace (over the counter) 100 mg twice a day.  Use MiraLax (over the counter) for constipation as needed.   Diet - low sodium heart healthy   Complete by: As directed    Increase activity slowly as tolerated   Complete by: As directed    Weight bearing as tolerated with assist device (walker, cane, etc) as directed, use it as long as suggested by your surgeon or therapist, typically at least 4-6 weeks.   Post-operative opioid taper instructions:   Complete by: As directed    POST-OPERATIVE OPIOID TAPER INSTRUCTIONS: It is important to wean off of your opioid medication as soon as possible. If you do not need pain medication after your surgery it is ok to stop day one. Opioids include: Codeine, Hydrocodone(Norco, Vicodin), Oxycodone(Percocet, oxycontin) and hydromorphone amongst others.  Long term and even short term use of opiods can cause: Increased pain response Dependence Constipation Depression Respiratory depression And more.  Withdrawal symptoms can include Flu like symptoms Nausea, vomiting And more Techniques to manage these symptoms Hydrate well Eat regular  healthy meals Stay active Use relaxation techniques(deep breathing, meditating, yoga) Do Not substitute Alcohol  to help with tapering If you have been on opioids for less than two weeks and do not have pain than it is ok to stop all together.  Plan to wean off of opioids This plan should start within one week post op of your joint replacement. Maintain the same interval or time between taking each dose and first decrease the dose.  Cut the total daily intake of opioids by one tablet each day Next start to increase the time between doses. The last dose that should be eliminated is the evening dose.      TED hose   Complete by: As directed    Use stockings (TED hose) for 2 weeks on both leg(s).  You may remove them at night for sleeping.        Follow-up Information     Durene Romans, MD. Schedule an appointment as soon as possible for a visit in 2 week(s).   Specialty: Orthopedic Surgery Contact information: [address] [place] [address] [phone]                  Signed: Cassandria Anger 02/27/2023, 7:28 AM

## 2023-03-31 NOTE — Progress Notes (Deleted)
Cardiology Office Note:    Date:  03/31/2023   ID:  Tina Hunt, DOB 1976-04-27, MRN [id number]  PCP:  Lance Bosch, NP   [facility] Providers Cardiologist:  None { Click to update primary MD,subspecialty MD or APP then REFRESH:1}    Referring MD: Laurann Montana, MD   No chief complaint on file. ***  History of Present Illness:    Tina Hunt is a 47 y.o. female is seen at the request of Dr Cliffton Asters for evaluation of chest pain. She has a history of GERD and OSA. She underwent right THR at the end of May.   Past Medical History:  Diagnosis Date   Anxiety    Arthritis    "a little in Right knee"   Chronic back pain    Depression    Endometriosis    GERD (gastroesophageal reflux disease)    Hallux varus (acquired), right foot    Headache(784.0)    MIGRAINES    History of blood transfusion 01/10/2003   [facility] - 3 units transfused   with c section   Migraine    Neck pain    clench teeth at night   Neuromuscular disorder (HCC)    BLACKOUT SPELLS  BUT NONE SINCE 1996    Pneumonia    hx of 3 years ago   PONV (postoperative nausea and vomiting)    with tonsils as child, no problems as adult   Recurrent sinusitis    Sleep apnea    Does not use CPAP  due yo weight loss   Tuberculosis    TB + SKIN TEST, CXR was normal    Past Surgical History:  Procedure Laterality Date   ADENOIDECTOMY  1995   with tube placement   CESAREAN SECTION     2004    CHROMOPERTUBATION Bilateral 10/09/2015   Procedure: CHROMOPERTUBATION;  Surgeon: Osborn Coho, MD;  Location: [facility];  Service: Gynecology;  Laterality: Bilateral;   CYSTOSCOPY Bilateral 03/20/2017   Procedure: CYSTOSCOPY;  Surgeon: Osborn Coho, MD;  Location: [facility];  Service: Gynecology;  Laterality: Bilateral;   DILITATION & CURRETTAGE/HYSTROSCOPY WITH NOVASURE ABLATION N/A 10/09/2015   Procedure: DILATATION & CURETTAGE/HYSTEROSCOPY;  Surgeon: Osborn Coho, MD;  Location: [facility];  Service: Gynecology;   Laterality: N/A;   KNEE ARTHROSCOPY Left    1988    KNEE ARTHROSCOPY Left 03/12/2014   Procedure: LEFT ARTHROSCOPY KNEE WITH DEBRIDEMENT;  Surgeon: Loanne Drilling, MD;  Location: [facility];  Service: Orthopedics;  Laterality: Left;  medical and lateral repair menius   LAPAROSCOPIC OVARIAN CYSTECTOMY Right 10/09/2015   Procedure: LAPAROSCOPIC RIGHT OVARIAN CYSTECTOMIES, LEFT PARATUBAL CYSTECTOMY ;  Surgeon: Osborn Coho, MD;  Location: [facility];  Service: Gynecology;  Laterality: Right;   LIGAMENT REPAIR Right 09/14/2018   Procedure: Right hallux and 2nd metatarsal phalangeal joint collateral ligament and plantar plate repair;  Surgeon: Toni Arthurs, MD;  Location: [facility];  Service: Orthopedics;  Laterality: Right;    SLEEVE GASTROPLASTY     TONSILLECTOMY  1983, 1985   ADENOID X2 (SEPARATE SURGERY)  TUBE PLACEMENT   TOTAL HIP ARTHROPLASTY Right 02/21/2023   Procedure: TOTAL HIP ARTHROPLASTY ANTERIOR APPROACH;  Surgeon: Durene Romans, MD;  Location: [facility];  Service: Orthopedics;  Laterality: Right;   TOTAL LAPAROSCOPIC HYSTERECTOMY WITH SALPINGECTOMY Bilateral 03/20/2017   Procedure: HYSTERECTOMY TOTAL LAPAROSCOPIC WITH BILATERAL SALPINGECTOMY;  Surgeon: Osborn Coho, MD;  Location: [facility];  Service: Gynecology;  Laterality: Bilateral;   TYMPANOSTOMY TUBE PLACEMENT  1989, 1991, 1999, 2002, 2006, 2009, 2013    Current Medications: No outpatient medications have been marked as taking for the 04/04/23 encounter (Appointment) with [place],  M, MD.     Allergies:   Tape, Aspirin, Oseltamivir, Biaxin [clarithromycin], Nsaids, and Penicillins   Social History   Socioeconomic History   Marital status: Married    Spouse name: Not on file   Number of children: 1   Years of education: Bachelors   Highest education level: Not on file  Occupational History   Not on file  Tobacco Use   Smoking status: Never    Passive exposure: Never   Smokeless tobacco: Never  Vaping  Use   Vaping Use: Former   Quit date: 11/30/2022  Substance and Sexual Activity   Alcohol use: Not Currently    Alcohol/week: 1.0 standard drink of alcohol    Types: 1 Glasses of wine per week    Comment: social   Drug use: No    Comment: Gummies TCH occasional   Sexual activity: Yes    Birth control/protection: None, Surgical  Other Topics Concern   Not on file  Social History Narrative   Lives at home with husband and son.   Right-handed.   4-6 cups caffeine per day.   Social Determinants of Health   Financial Resource Strain: Not on file  Food Insecurity: Food Insecurity Present (02/21/2023)   Hunger Vital Sign    Worried About Running Out of Food in the Last Year: Sometimes true    Ran Out of Food in the Last Year: Sometimes true  Transportation Needs: No Transportation Needs (02/21/2023)   PRAPARE - Administrator, Civil Service (Medical): No    Lack of Transportation (Non-Medical): No  Physical Activity: Not on file  Stress: Not on file  Social Connections: Not on file     Family History: The patient's ***family history includes Alcohol abuse in her father and paternal grandfather; CAD in her father; Diabetes in her father and paternal grandfather; Heart attack in her father and paternal grandfather; Hypertension in her paternal grandfather; Lung cancer in her maternal grandfather; Stroke in her mother.  ROS:   Please see the history of present illness.    *** All other systems reviewed and are negative.  EKGs/Labs/Other Studies Reviewed:    The following studies were reviewed today: ***      Recent Labs: 07/25/2022: ALT 16 02/22/2023: BUN 19; Creatinine, Ser 0.73; Hemoglobin 10.2; Platelets 204; Potassium 4.0; Sodium 137  Recent Lipid Panel No results found for: "CHOL", "TRIG", "HDL", "CHOLHDL", "VLDL", "LDLCALC", "LDLDIRECT"   Risk Assessment/Calculations:   {Does this patient have ATRIAL FIBRILLATION?:[id number]}  No BP recorded.  {Refresh Note  OR Click here to enter BP  :1}***         Physical Exam:    VS:  LMP  (LMP Unknown)     Wt Readings from Last 3 Encounters:  02/21/23 227 lb (103 kg)  02/09/23 227 lb (103 kg)  07/25/22 256 lb (116.1 kg)     GEN: *** Well nourished, well developed in no acute distress HEENT: Normal NECK: No JVD; No carotid bruits LYMPHATICS: No lymphadenopathy CARDIAC: ***RRR, no murmurs, rubs, gallops RESPIRATORY:  Clear to auscultation without rales, wheezing or rhonchi  ABDOMEN: Soft, non-tender, non-distended MUSCULOSKELETAL:  No edema; No deformity  SKIN: Warm and dry NEUROLOGIC:  Alert and oriented x 3 PSYCHIATRIC:  Normal affect   ASSESSMENT:    No diagnosis found. PLAN:  In order of problems listed above:  ***      {Are you ordering a CV Procedure (e.g. stress test, cath, DCCV, TEE, etc)?   Press F2        :[id number]}    Medication Adjustments/Labs and Tests Ordered: Current medicines are reviewed at length with the patient today.  Concerns regarding medicines are outlined above.  No orders of the defined types were placed in this encounter.  No orders of the defined types were placed in this encounter.   There are no Patient Instructions on file for this visit.   Signed,  [place], MD  03/31/2023 1:55 PM    [facility]

## 2023-04-04 ENCOUNTER — Ambulatory Visit: Payer: BC Managed Care – PPO | Attending: Cardiology | Admitting: Cardiology

## 2023-04-05 ENCOUNTER — Encounter: Payer: Self-pay | Admitting: Cardiology

## 2023-05-08 ENCOUNTER — Telehealth: Payer: Self-pay | Admitting: Neurology

## 2023-05-08 MED ORDER — SUMATRIPTAN SUCCINATE 6 MG/0.5ML ~~LOC~~ SOAJ: 0.5000 mL | SUBCUTANEOUS | 1 refills | Status: AC | PRN

## 2023-05-08 MED ORDER — AJOVY 225 MG/1.5ML ~~LOC~~ SOAJ: 225.0000 mg | SUBCUTANEOUS | 1 refills | Status: DC

## 2023-05-08 MED ORDER — NURTEC 75 MG PO TBDP: 75.0000 mg | ORAL_TABLET | ORAL | 1 refills | Status: AC | PRN

## 2023-05-08 NOTE — Telephone Encounter (Signed)
[employer] Herbert Seta) Pt requesting prescriptions sent to [facility] for mail order for a 90-day supply of Fremanezumab-vfrm (AJOVY) 225 MG/1.5ML SOAJ and SUMAtriptan (IMITREX STATDOSE SYSTEM) 6 MG/0.5ML SOAJ and Rimegepant Sulfate (NURTEC) 75 MG TBDP  [facility] Phone: [phone]

## 2023-07-26 ENCOUNTER — Encounter (HOSPITAL_COMMUNITY): Payer: Self-pay

## 2023-07-26 ENCOUNTER — Emergency Department (HOSPITAL_COMMUNITY)
Admission: EM | Admit: 2023-07-26 | Discharge: 2023-07-26 | Disposition: A | Discharge status: Home / Self Care | Payer: BC Managed Care – PPO | Attending: Emergency Medicine | Admitting: Emergency Medicine

## 2023-07-26 ENCOUNTER — Emergency Department (HOSPITAL_COMMUNITY): Payer: BC Managed Care – PPO

## 2023-07-26 ENCOUNTER — Other Ambulatory Visit: Payer: Self-pay

## 2023-07-26 DIAGNOSIS — Z79899 Other long term (current) drug therapy: Secondary | ICD-10-CM | POA: Insufficient documentation

## 2023-07-26 DIAGNOSIS — I10 Essential (primary) hypertension: Secondary | ICD-10-CM | POA: Insufficient documentation

## 2023-07-26 LAB — CBC
HCT: 42.7 % (ref 36.0–46.0)
Hemoglobin: 14.5 g/dL (ref 12.0–15.0)
MCH: 32.6 pg (ref 26.0–34.0)
MCHC: 34 g/dL (ref 30.0–36.0)
MCV: 96 fL (ref 80.0–100.0)
Platelets: 280 10*3/uL (ref 150–400)
RBC: 4.45 MIL/uL (ref 3.87–5.11)
RDW: 13.9 % (ref 11.5–15.5)
WBC: 8.1 10*3/uL (ref 4.0–10.5)
nRBC: 0 % (ref 0.0–0.2)

## 2023-07-26 LAB — HEPATIC FUNCTION PANEL
ALT: 21 U/L (ref 0–44)
AST: 22 U/L (ref 15–41)
Albumin: 4.8 g/dL (ref 3.5–5.0)
Alkaline Phosphatase: 98 U/L (ref 38–126)
Bilirubin, Direct: 0.2 mg/dL (ref 0.0–0.2)
Indirect Bilirubin: 0.9 mg/dL (ref 0.3–0.9)
Total Bilirubin: 1.1 mg/dL (ref 0.3–1.2)
Total Protein: 8.1 g/dL (ref 6.5–8.1)

## 2023-07-26 LAB — TROPONIN I (HIGH SENSITIVITY)
Troponin I (High Sensitivity): 6 ng/L (ref <18)
Troponin I (High Sensitivity): 8 ng/L (ref <18)

## 2023-07-26 LAB — T4, FREE: Free T4: 0.94 ng/dL (ref 0.61–1.12)

## 2023-07-26 LAB — BASIC METABOLIC PANEL
Anion gap: 12 (ref 5–15)
BUN: 12 mg/dL (ref 6–20)
CO2: 25 mmol/L (ref 22–32)
Calcium: 10 mg/dL (ref 8.9–10.3)
Chloride: 104 mmol/L (ref 98–111)
Creatinine, Ser: 0.76 mg/dL (ref 0.44–1.00)
GFR, Estimated: >60 mL/min (ref >60)
Glucose, Bld: 114 mg/dL — ABNORMAL HIGH (ref 70–99)
Potassium: 3 mmol/L — ABNORMAL LOW (ref 3.5–5.1)
Sodium: 141 mmol/L (ref 135–145)

## 2023-07-26 LAB — TSH: TSH: 0.655 u[IU]/mL (ref 0.350–4.500)

## 2023-07-26 MED ORDER — MORPHINE SULFATE (PF) 4 MG/ML IV SOLN
4.0000 mg | Freq: Once | INTRAVENOUS | Status: AC
  Administered 2023-07-26: 4 mg via INTRAVENOUS
  Filled 2023-07-26: qty 1

## 2023-07-26 MED ORDER — ONDANSETRON HCL 4 MG/2ML IJ SOLN
4.0000 mg | Freq: Once | INTRAMUSCULAR | Status: AC
  Administered 2023-07-26: 4 mg via INTRAVENOUS
  Filled 2023-07-26: qty 2

## 2023-07-26 MED ORDER — LISINOPRIL 5 MG PO TABS: 5.0000 mg | ORAL_TABLET | Freq: Every day | ORAL | 3 refills | Status: DC

## 2023-07-26 MED ORDER — IOHEXOL 350 MG/ML SOLN
80.0000 mL | Freq: Once | INTRAVENOUS | Status: AC | PRN
  Administered 2023-07-26: 100 mL via INTRAVENOUS

## 2023-07-26 NOTE — ED Triage Notes (Signed)
Pt is coming in with presumed HTN, she expresses not feeling well for the last few days and today at school he school nurse checked her blood pressure and it was elevated above 200 sys twice. She also expresses that last week she felt like her legs were swollen and on Monday she voided a lot throughout the day. She has no Hx of HTN, and is currently not on any medications for HTN or fluid overload. She also endorses some chest pain at a 4/10 that she catches her breath and she can feel in her back.

## 2023-07-26 NOTE — ED Provider Notes (Signed)
[place] EMERGENCY DEPARTMENT AT [facility] Provider Note   CSN: [id number] Arrival date & time: 07/26/23  1508     History  Chief Complaint  Patient presents with   Hypertension    Tina Hunt is a 47 y.o. female.  47 yo F with a chief complaints of not feeling right.  This been going on for about a week.  She tells me that she gets sweaty out of nowhere and feels like her hands turn red.  She feels like her chest feels a little bit uncomfortable at times.  She thinks it is more mild.  She had some swelling to her legs that she took some hydrochlorothiazide for her and feels like its gotten better.  Has a history of migraines but none currently.  She takes ibuprofen occasionally for discomfort but denies any other over-the-counter medications or supplements.   Hypertension       Home Medications Prior to Admission medications   Medication Sig Start Date End Date Taking? Authorizing Provider  acetaminophen (TYLENOL) 500 MG tablet Take 1,000 mg by mouth every 6 (six) hours as needed for moderate pain.   Yes [provider]  diphenhydrAMINE (BENADRYL) 25 mg capsule Take 25 mg by mouth every 6 (six) hours as needed for allergies.   Yes [provider]  diphenhydramine-acetaminophen (TYLENOL PM) 25-500 MG TABS tablet Take 1 tablet by mouth at bedtime as needed.   Yes [provider]  DULoxetine (CYMBALTA) 30 MG capsule Take 60 mg by mouth 2 (two) times daily. 01/20/23  Yes [provider]  Fremanezumab-vfrm (AJOVY) 225 MG/1.5ML SOAJ Inject 225 mg into the skin every 30 (thirty) days. 05/08/23  Yes Glean Salvo, NP  gabapentin (NEURONTIN) 600 MG tablet Take 600 mg by mouth 3 (three) times daily. 01/24/23  Yes [provider]  lisinopril (ZESTRIL) 5 MG tablet Take 1 tablet (5 mg total) by mouth daily. 07/26/23  Yes Melene Plan, DO  omeprazole (PRILOSEC) 20 MG capsule Take 20 mg by mouth 2 (two) times daily before a meal.   12/19/17  Yes [provider]  ondansetron (ZOFRAN-ODT) 4 MG disintegrating tablet DISSOLVE 1 TABLET ON TONGUE EVERY 8 HOURS AS NEEDED. Patient taking differently: Take 4 mg by mouth every 8 (eight) hours as needed for refractory nausea / vomiting. DISSOLVE 1 TABLET ON TONGUE EVERY 8 HOURS AS NEEDED. 12/28/21  Yes Glean Salvo, NP  Oxycodone HCl 10 MG TABS Take 10 mg by mouth 4 (four) times daily as needed. 07/17/23  Yes [provider]  polyethylene glycol (MIRALAX / GLYCOLAX) 17 g packet Take 17 g by mouth 2 (two) times daily. 02/22/23  Yes Cassandria Anger, PA-C  Rimegepant Sulfate (NURTEC) 75 MG TBDP Take 1 tablet (75 mg total) by mouth as needed (Take 1 at onset of headache, max is 1 tablet in 24 hours). 05/08/23  Yes Glean Salvo, NP  SUMAtriptan (IMITREX STATDOSE SYSTEM) 6 MG/0.5ML SOAJ Inject 0.5 mLs into the skin as needed (use if maxalt fails). 05/08/23  Yes Glean Salvo, NP  tiZANidine (ZANAFLEX) 4 MG tablet Take 4 mg by mouth 3 (three) times daily.   Yes [provider]  topiramate (TOPAMAX) 50 MG tablet Take 50 mg by mouth 2 (two) times daily. 01/19/23  Yes [provider]      Allergies    Tape, Aspirin, Oseltamivir, Biaxin [clarithromycin], Nsaids, and Penicillins    Review of Systems   Review of Systems  Physical  Exam Updated Vital Signs BP (!) 160/108 (BP Location: Left Arm)   Pulse 84   Temp 98.5 F (36.9 C) (Oral)   Resp 19   LMP  (LMP Unknown)   SpO2 100%  Physical Exam Vitals and nursing note reviewed.  Constitutional:      General: She is not in acute distress.    Appearance: She is well-developed. She is not diaphoretic.  HENT:     Head: Normocephalic and atraumatic.  Eyes:     Pupils: Pupils are equal, round, and reactive to light.  Cardiovascular:     Rate and Rhythm: Normal rate and regular rhythm.     Heart sounds: No murmur heard.    No friction rub. No gallop.  Pulmonary:     Effort: Pulmonary effort is normal.      Breath sounds: No wheezing or rales.  Abdominal:     General: There is no distension.     Palpations: Abdomen is soft.     Tenderness: There is no abdominal tenderness.  Musculoskeletal:        General: No tenderness.     Cervical back: Normal range of motion and neck supple.  Skin:    General: Skin is warm and dry.  Neurological:     Mental Status: She is alert and oriented to person, place, and time.     GCS: GCS eye subscore is 4. GCS verbal subscore is 5. GCS motor subscore is 6.     Cranial Nerves: Cranial nerves 2-12 are intact.     Sensory: Sensation is intact.     Motor: Motor function is intact.     Coordination: Coordination is intact.     Comments: Benign neurologic exam  Psychiatric:        Behavior: Behavior normal.     ED Results / Procedures / Treatments   Labs (all labs ordered are listed, but only abnormal results are displayed) Labs Reviewed  BASIC METABOLIC PANEL - Abnormal; Notable for the following components:      Result Value   Potassium 3.0 (*)    Glucose, Bld 114 (*)    All other components within normal limits  CBC  TSH  T4, FREE  HEPATIC FUNCTION PANEL  TROPONIN I (HIGH SENSITIVITY)  TROPONIN I (HIGH SENSITIVITY)    EKG None  Radiology CT Angio Chest/Abd/Pel for Dissection W and/or Wo Contrast  Result Date: 07/26/2023 CLINICAL DATA:  Chest pain to back. Hypertension. Acute aortic syndrome suspected EXAM: CT ANGIOGRAPHY CHEST, ABDOMEN AND PELVIS TECHNIQUE: Non-contrast CT of the chest was initially obtained. Multidetector CT imaging through the chest, abdomen and pelvis was performed using the standard protocol during bolus administration of intravenous contrast. Multiplanar reconstructed images and MIPs were obtained and reviewed to evaluate the vascular anatomy. RADIATION DOSE REDUCTION: This exam was performed according to the departmental dose-optimization program which includes automated exposure control, adjustment of the mA and/or  kV according to patient size and/or use of iterative reconstruction technique. CONTRAST:  OMNIPAQUE IOHEXOL 350 MG/ML SOLN COMPARISON:  Same day chest radiograph; pelvic ultrasound 05/28/2015 FINDINGS: CTA CHEST FINDINGS Cardiovascular: No acute aortic syndrome. Normal heart size. No pericardial effusion. Mediastinum/Nodes: Trachea and esophagus are unremarkable. No thoracic adenopathy. Lungs/Pleura: The lungs are clear. Musculoskeletal: No acute fracture. Review of the MIP images confirms the above findings. CTA ABDOMEN AND PELVIS FINDINGS VASCULAR Widely patent aorta, mesenteric, renal, and iliac arteries. No aneurysm or dissection. Review of the MIP images confirms the above findings. NON-VASCULAR Hepatobiliary: Hepatic  steatosis. Pancreas: Unremarkable. Spleen: Unremarkable. Adrenals/Urinary Tract: Unremarkable adrenal glands and kidneys. The bladder is obscured by streak artifact. Stomach/Bowel: No bowel obstruction or bowel wall thickening. Normal appendix. Postoperative change of sleeve gastrectomy. Lymphatic: No lymphadenopathy. Reproductive: Hysterectomy. The right adnexa is not well visualized due to streak artifact. Other: No free intraperitoneal fluid or air. Musculoskeletal: No acute fracture.  Right THA. Review of the MIP images confirms the above findings. IMPRESSION: 1. No acute aortic syndrome. 2. No acute abnormality in the chest, abdomen, or pelvis. 3. Hepatic steatosis. Electronically Signed   By: Minerva Fester M.D.   On: 07/26/2023 20:33   DG Chest 2 View  Result Date: 07/26/2023 CLINICAL DATA:  Chest pain. EXAM: CHEST - 2 VIEW COMPARISON:  01/03/2023. FINDINGS: Bilateral lung fields are clear. Bilateral costophrenic angles are clear. Normal cardio-mediastinal silhouette. No acute osseous abnormalities. The soft tissues are within normal limits. IMPRESSION: *No active cardiopulmonary disease. Electronically Signed   By: Jules Schick M.D.   On: 07/26/2023 16:47     Procedures Procedures   Procedure note: Ultrasound Guided Peripheral IV Ultrasound guided peripheral 1.88 inch angiocath IV placement performed by me. Indications: Nursing unable to place IV. Details: The antecubital fossa and upper arm were evaluated with a multifrequency linear probe. Patent brachial veins were noted. 1 attempt was made to cannulate a vein under realtime US guidance with successful cannulation of the vein and catheter placement. There is return of non-pulsatile dark red blood. The patient tolerated the procedure well without complications. Images archived electronically.  CPT codes: 44034 and [phone]    Medications Ordered in ED Medications  morphine (PF) 4 MG/ML injection 4 mg (4 mg Intravenous Given 07/26/23 1816)  ondansetron (ZOFRAN) injection 4 mg (4 mg Intravenous Given 07/26/23 1816)  iohexol (OMNIPAQUE) 350 MG/ML injection 80 mL (100 mLs Intravenous Contrast Given 07/26/23 1902)    ED Course/ Medical Decision Making/ A&P                                 Medical Decision Making Amount and/or Complexity of Data Reviewed Labs: ordered. Radiology: ordered.  Risk Prescription drug management.   47 yo F with a chief complaint of not feeling well.  She has trouble describing this or perhaps can describe it to well and there are multiple nonspecific symptoms associated.  Has been going on for about a week.  She has a benign neurologic exam here.  In triage she was significantly tachycardic and hypertensive.  Her tachycardia has resolved by the time of my exam.  She recognized the nurse when they came into start an IV and today her that her sister was just admitted for PRESS.  I am not sure how to explain why two sisters both have hypertension, she seems to think it is unrelated.  I wonder if there is some environmental or external factors of this not being described here.  She denies cocaine or methamphetamine use.  Patient's troponins negative, no leukocytosis no  anemia.  Her potassium is mildly low at 3.   She tells me she is very sweaty but I do not appreciate any significant sweating on exam.  Will add on thyroid studies as well.  Plan to obtain a CT angiogram of the chest and pelvis to assess for dissection.    Thyroid studies negative, troponins negative.  LFTs are unremarkable.  CT angiogram of the chest without obvious acute pathology.  Patient continues  to be quite hypertensive.  Per protocol I will start her on antihypertensive medications.  Will have her follow-up with her family doctor in the office.  9:33 PM:  I have discussed the diagnosis/risks/treatment options with the patient, family, and friends .  Evaluation and diagnostic testing in the emergency department does not suggest an emergent condition requiring admission or immediate intervention beyond what has been performed at this time.  They will follow up with PCP. We also discussed returning to the ED immediately if new or worsening sx occur. We discussed the sx which are most concerning (e.g., sudden worsening pain, fever, inability to tolerate by mouth) that necessitate immediate return. Medications administered to the patient during their visit and any new prescriptions provided to the patient are listed below.  Medications given during this visit Medications  morphine (PF) 4 MG/ML injection 4 mg (4 mg Intravenous Given 07/26/23 1816)  ondansetron (ZOFRAN) injection 4 mg (4 mg Intravenous Given 07/26/23 1816)  iohexol (OMNIPAQUE) 350 MG/ML injection 80 mL (100 mLs Intravenous Contrast Given 07/26/23 1902)     The patient appears reasonably screen and/or stabilized for discharge and I doubt any other medical condition or other [facility] requiring further screening, evaluation, or treatment in the ED at this time prior to discharge.          Final Clinical Impression(s) / ED Diagnoses Final diagnoses:  Uncontrolled hypertension    Rx / DC Orders ED Discharge Orders           Ordered    lisinopril (ZESTRIL) 5 MG tablet  Daily        07/26/23 2036              Melene Plan, DO 07/26/23 2133

## 2023-07-26 NOTE — Progress Notes (Deleted)
Patient: Tina Hunt Date of Birth: September 09, 1976  Reason for Visit: Follow up History from: Patient Primary Neurologist: [place]  ASSESSMENT AND PLAN 47 y.o. year old female   1.  Chronic migraine headache -Under good control, stopped all her medications -Will restart Ajovy, stay off propanolol and Topamax for migraine prevention -Continue Nurtec as needed for acute headache, Imitrex injection for severe headache, uses Phenergan for significant nausea, Zofran for nausea when she is working -Follow-up in 1 year or sooner if needed  HISTORY  Update October 13, 2021 SS: Seen in ER 10/01/21 for typical migraine, treated with migraine cocktail IV Benadryl, Decadron, Compazine, Toradol, headache completely resolved. In ER November 2022 for HTN, dizziness, had stopped propranolol, was restarted. CT head was normal.    Here today for VV, had run out of propranolol, missed a visit here. 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    Is on propranolol 60 mg daily, Topamax 100 mg, takes 2 at night, on Ajovy for prevention. Taking Nurtec for rescue, usually works fairly well, Imitrex injections works the best, saves them. Has Zofran and phenergan for nausea, phenergan is reserved for really bad headaches.  Uses Zofran when she is at work. Migraines are worse in the winter. Having 1 migraine a week. Work stress is contributing to migraines, will be looking for another job. In the past was on propranolol 60 mg twice daily.    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 Has been out of all her medications for at least 1 month, cost factor, her new insurance hadn't kicked it. Off Ajovy, topamax, propranolol. Uses Nurtec for moderate headaches, Imitrex injection for severe headaches. On average only have 1 migraine a month for last several months. Uses phenergan for bad migraine nausea, zofran when at school. Needs right hip replacement, but has to lose weight.    Update July 27, 2023 SS:   REVIEW OF SYSTEMS: Out of a complete 14 system review of symptoms, the patient complains only of the following symptoms, and all other reviewed systems are negative.  See HPI  ALLERGIES: Allergies  Allergen Reactions   Tape Rash   Aspirin Other (See Comments)    Stomach cramping   Oseltamivir Rash   Biaxin [Clarithromycin] Diarrhea and Nausea And Vomiting   Nsaids Other (See Comments)    Had gastric bypass, cannot take   Penicillins Hives, Nausea And Vomiting and Rash    Tolerates cephalexin.     HOME MEDICATIONS: Outpatient Medications Prior to Visit  Medication Sig Dispense Refill   acetaminophen (TYLENOL) 500 MG tablet Take 1,000 mg by mouth every 6 (six) hours as needed for moderate pain.     diphenhydrAMINE (BENADRYL) 25 mg capsule Take 25 mg by mouth every 6 (six) hours as needed for allergies.     diphenhydramine-acetaminophen (TYLENOL PM) 25-500 MG TABS tablet Take 1 tablet by mouth at bedtime as needed.     DULoxetine (CYMBALTA) 30 MG capsule Take 60 mg by mouth 2 (two) times daily.     Fremanezumab-vfrm (AJOVY) 225 MG/1.5ML SOAJ Inject 225 mg into the skin every 30 (thirty) days. 4.5 mL 1   gabapentin (NEURONTIN) 600 MG tablet Take 600 mg by mouth 3 (three) times daily.     lisinopril (ZESTRIL) 5 MG tablet Take 1 tablet (5 mg total) by mouth daily. 30 tablet 3   omeprazole (PRILOSEC) 20 MG capsule Take 20 mg by mouth 2 (two)  times daily before a meal.      ondansetron (ZOFRAN-ODT) 4 MG disintegrating tablet DISSOLVE 1 TABLET ON TONGUE EVERY 8 HOURS AS NEEDED. (Patient taking differently: Take 4 mg by mouth every 8 (eight) hours as needed for refractory nausea / vomiting. DISSOLVE 1 TABLET ON TONGUE EVERY 8 HOURS AS NEEDED.) 20 tablet 0   Oxycodone HCl 10 MG TABS Take 10 mg by mouth 4 (four) times daily as needed.     polyethylene glycol (MIRALAX / GLYCOLAX) 17 g packet Take 17 g by mouth 2 (two) times daily. 14 each 0   Rimegepant Sulfate  (NURTEC) 75 MG TBDP Take 1 tablet (75 mg total) by mouth as needed (Take 1 at onset of headache, max is 1 tablet in 24 hours). 36 tablet 1   SUMAtriptan (IMITREX STATDOSE SYSTEM) 6 MG/0.5ML SOAJ Inject 0.5 mLs into the skin as needed (use if maxalt fails). 18 mL 1   tiZANidine (ZANAFLEX) 4 MG tablet Take 4 mg by mouth 3 (three) times daily.     topiramate (TOPAMAX) 50 MG tablet Take 50 mg by mouth 2 (two) times daily.     No facility-administered medications prior to visit.    PAST MEDICAL HISTORY: Past Medical History:  Diagnosis Date   Anxiety    Arthritis    "a little in Right knee"   Chronic back pain    Depression    Endometriosis    GERD (gastroesophageal reflux disease)    Hallux varus (acquired), right foot    Headache(784.0)    MIGRAINES    History of blood transfusion 01/10/2003   [facility] - 3 units transfused   with c section   Migraine    Neck pain    clench teeth at night   Neuromuscular disorder (HCC)    BLACKOUT SPELLS  BUT NONE SINCE 1996    Pneumonia    hx of 3 years ago   PONV (postoperative nausea and vomiting)    with tonsils as child, no problems as adult   Recurrent sinusitis    Sleep apnea    Does not use CPAP  due yo weight loss   Tuberculosis    TB + SKIN TEST, CXR was normal    PAST SURGICAL HISTORY: Past Surgical History:  Procedure Laterality Date   ADENOIDECTOMY  1995   with tube placement   CESAREAN SECTION     2004    CHROMOPERTUBATION Bilateral 10/09/2015   Procedure: CHROMOPERTUBATION;  Surgeon: Osborn Coho, MD;  Location: [facility];  Service: Gynecology;  Laterality: Bilateral;   CYSTOSCOPY Bilateral 03/20/2017   Procedure: CYSTOSCOPY;  Surgeon: Osborn Coho, MD;  Location: [facility];  Service: Gynecology;  Laterality: Bilateral;   DILITATION & CURRETTAGE/HYSTROSCOPY WITH NOVASURE ABLATION N/A 10/09/2015   Procedure: DILATATION & CURETTAGE/HYSTEROSCOPY;  Surgeon: Osborn Coho, MD;  Location: [facility];  Service: Gynecology;  Laterality: N/A;    KNEE ARTHROSCOPY Left    1988    KNEE ARTHROSCOPY Left 03/12/2014   Procedure: LEFT ARTHROSCOPY KNEE WITH DEBRIDEMENT;  Surgeon: Loanne Drilling, MD;  Location: [facility];  Service: Orthopedics;  Laterality: Left;  medical and lateral repair menius   LAPAROSCOPIC OVARIAN CYSTECTOMY Right 10/09/2015   Procedure: LAPAROSCOPIC RIGHT OVARIAN CYSTECTOMIES, LEFT PARATUBAL CYSTECTOMY ;  Surgeon: Osborn Coho, MD;  Location: [facility];  Service: Gynecology;  Laterality: Right;   LIGAMENT REPAIR Right 09/14/2018   Procedure: Right hallux and 2nd metatarsal phalangeal joint collateral ligament and plantar plate repair;  Surgeon: Toni Arthurs, MD;  Location: [facility];  Service: Orthopedics;  Laterality: Right;    SLEEVE GASTROPLASTY     TONSILLECTOMY  1983, 1985   ADENOID X2 (SEPARATE SURGERY)  TUBE PLACEMENT   TOTAL HIP ARTHROPLASTY Right 02/21/2023   Procedure: TOTAL HIP ARTHROPLASTY ANTERIOR APPROACH;  Surgeon: Durene Romans, MD;  Location: [facility];  Service: Orthopedics;  Laterality: Right;   TOTAL LAPAROSCOPIC HYSTERECTOMY WITH SALPINGECTOMY Bilateral 03/20/2017   Procedure: HYSTERECTOMY TOTAL LAPAROSCOPIC WITH BILATERAL SALPINGECTOMY;  Surgeon: Osborn Coho, MD;  Location: [facility];  Service: Gynecology;  Laterality: Bilateral;   TYMPANOSTOMY TUBE PLACEMENT  1989, 1991, 1999, 2002, 2006, 2009, 2013    FAMILY HISTORY: Family History  Problem Relation Age of Onset   Diabetes Father    Heart attack Father    CAD Father    Alcohol abuse Father    Stroke Mother    Diabetes Paternal Grandfather    Heart attack Paternal Grandfather    Hypertension Paternal Grandfather    Alcohol abuse Paternal Grandfather    Lung cancer Maternal Grandfather     SOCIAL HISTORY: Social History   Socioeconomic History   Marital status: Married    Spouse name: Not on file   Number of children: 1   Years of education: Bachelors   Highest education level: Not on file  Occupational History    Not on file  Tobacco Use   Smoking status: Never    Passive exposure: Never   Smokeless tobacco: Never  Vaping Use   Vaping status: Former   Quit date: 11/30/2022  Substance and Sexual Activity   Alcohol use: Not Currently    Alcohol/week: 1.0 standard drink of alcohol    Types: 1 Glasses of wine per week    Comment: social   Drug use: No    Comment: Gummies TCH occasional   Sexual activity: Yes    Birth control/protection: None, Surgical  Other Topics Concern   Not on file  Social History Narrative   Lives at home with husband and son.   Right-handed.   4-6 cups caffeine per day.   Social Determinants of Health   Financial Resource Strain: Patient Declined (01/19/2023)   Received from [facility]   Overall Financial Resource Strain (CARDIA)    Difficulty of Paying Living Expenses: Patient declined  Food Insecurity: Food Insecurity Present (02/21/2023)   Hunger Vital Sign    Worried About Running Out of Food in the Last Year: Sometimes true    Ran Out of Food in the Last Year: Sometimes true  Transportation Needs: No Transportation Needs (02/21/2023)   PRAPARE - Administrator, Civil Service (Medical): No    Lack of Transportation (Non-Medical): No  Physical Activity: Not on file  Stress: Not on file  Social Connections: Unknown (01/26/2022)   Received from [facility], [facility]   Social Network    Social Network: Not on file  Intimate Partner Violence: Not At Risk (02/21/2023)   Humiliation, Afraid, Rape, and Kick questionnaire    Fear of Current or Ex-Partner: No    Emotionally Abused: No    Physically Abused: No    Sexually Abused: No   PHYSICAL EXAM  There were no vitals filed for this visit.  There is no height or weight on file to calculate BMI.  Generalized: Well developed, in no acute distress  Neurological examination  Mentation: Alert oriented to time, place, history taking. Follows all commands speech and language  fluent Cranial nerve  II-XII: Pupils were equal round reactive to light. Extraocular movements were full, visual field were full on confrontational test. Facial sensation and strength were normal. Head turning and shoulder shrug  were normal and symmetric. Motor: Good strength overall, exception 4/5 right hip flexion Sensory: Sensory testing is intact to soft touch on all 4 extremities. No evidence of extinction is noted.  Coordination: Cerebellar testing reveals good finger-nose-finger and heel-to-shin bilaterally.  Gait and station: Gait is normal.   Reflexes: Deep tendon reflexes are symmetric and normal bilaterally.   DIAGNOSTIC DATA (LABS, IMAGING, TESTING) - I reviewed patient records, labs, notes, testing and imaging myself where available.  Lab Results  Component Value Date   WBC 8.1 07/26/2023   HGB 14.5 07/26/2023   HCT 42.7 07/26/2023   MCV 96.0 07/26/2023   PLT 280 07/26/2023      Component Value Date/Time   NA 141 07/26/2023 1530   K 3.0 (L) 07/26/2023 1530   CL 104 07/26/2023 1530   CO2 25 07/26/2023 1530   GLUCOSE 114 (H) 07/26/2023 1530   BUN 12 07/26/2023 1530   CREATININE 0.76 07/26/2023 1530   CALCIUM 10.0 07/26/2023 1530   PROT 8.1 07/26/2023 1805   ALBUMIN 4.8 07/26/2023 1805   AST 22 07/26/2023 1805   ALT 21 07/26/2023 1805   ALKPHOS 98 07/26/2023 1805   BILITOT 1.1 07/26/2023 1805   GFRNONAA >60 07/26/2023 1530   GFRAA >60 03/26/2017 0135   No results found for: "CHOL", "HDL", "LDLCALC", "LDLDIRECT", "TRIG", "CHOLHDL" No results found for: "HGBA1C" No results found for: "VITAMINB12" Lab Results  Component Value Date   TSH 0.655 07/26/2023    Margie Ege, AGNP-C, DNP 07/26/2023, 9:15 PM [facility] [address], [address] [place], [place] [address] [phone]

## 2023-07-26 NOTE — Discharge Instructions (Signed)
Our workup here did not show any obvious cause of your high blood pressure.  I have started you on a very low-dose of a medication that can help you slowly bring it down.  Please call your family doctor tomorrow and let them know about your visit.    Please return for sudden worsening chest pain one-sided numbness or weakness or difficulty speech or swallowing.

## 2023-07-26 NOTE — ED Provider Triage Note (Signed)
Emergency Medicine Provider Triage Evaluation Note  Tina Hunt , a 47 y.o. female  was evaluated in triage.  Pt complains of chest pain.  Review of Systems  Positive: Chest pain, recent LE swelling (none now), high blood pressure (no history) Negative: Fever, h/o clots, SOb  Physical Exam  BP (!) 206/133 (BP Location: Right Arm)   Pulse (!) 120   Temp 97.8 F (36.6 C)   Resp (!) 21   LMP  (LMP Unknown)   SpO2 98%  Gen:   Awake, no distress   Resp:  Normal effort CTA MSK:   Moves extremities without difficulty No edema. Right scapular tenderness Other:  RRR (tachy on arrival), left sternal chest tenderness  Medical Decision Making  Medically screening exam initiated at 3:33 PM.  Appropriate orders placed.  Tina Hunt was informed that the remainder of the evaluation will be completed by another provider, this initial triage assessment does not replace that evaluation, and the importance of remaining in the ED until their evaluation is complete.  High blood pressure found by nurse at work without history. Had LE bilateral swelling earlier in the week. States has had swelling before and was given medication which she tried without result before Monday. Monday she was urinating frequently and edema resolved. Today was not feeling normal, went to nurse. Reports left sternal chest pain (reproducible) and back pain (correlates to right scapula; reproducible)   Elpidio Anis, PA-C 07/26/23 1537

## 2023-07-27 ENCOUNTER — Ambulatory Visit: Payer: BC Managed Care – PPO | Admitting: Neurology

## 2023-07-27 ENCOUNTER — Encounter: Payer: Self-pay | Admitting: Neurology

## 2023-12-29 ENCOUNTER — Emergency Department (HOSPITAL_BASED_OUTPATIENT_CLINIC_OR_DEPARTMENT_OTHER): Admitting: Radiology

## 2023-12-29 ENCOUNTER — Emergency Department (HOSPITAL_BASED_OUTPATIENT_CLINIC_OR_DEPARTMENT_OTHER): Admission: EM | Admit: 2023-12-29 | Discharge: 2023-12-29 | Disposition: A | Discharge status: Home / Self Care

## 2023-12-29 ENCOUNTER — Encounter (HOSPITAL_BASED_OUTPATIENT_CLINIC_OR_DEPARTMENT_OTHER): Payer: Self-pay

## 2023-12-29 ENCOUNTER — Other Ambulatory Visit: Payer: Self-pay

## 2023-12-29 DIAGNOSIS — R0789 Other chest pain: Secondary | ICD-10-CM | POA: Diagnosis not present

## 2023-12-29 DIAGNOSIS — R112 Nausea with vomiting, unspecified: Secondary | ICD-10-CM | POA: Insufficient documentation

## 2023-12-29 DIAGNOSIS — R519 Headache, unspecified: Secondary | ICD-10-CM | POA: Insufficient documentation

## 2023-12-29 DIAGNOSIS — E876 Hypokalemia: Secondary | ICD-10-CM | POA: Insufficient documentation

## 2023-12-29 DIAGNOSIS — R002 Palpitations: Secondary | ICD-10-CM | POA: Diagnosis present

## 2023-12-29 LAB — CBC
HCT: 36.1 % (ref 36.0–46.0)
Hemoglobin: 13.3 g/dL (ref 12.0–15.0)
MCH: 35.7 pg — ABNORMAL HIGH (ref 26.0–34.0)
MCHC: 36.8 g/dL — ABNORMAL HIGH (ref 30.0–36.0)
MCV: 96.8 fL (ref 80.0–100.0)
Platelets: 286 10*3/uL (ref 150–400)
RBC: 3.73 MIL/uL — ABNORMAL LOW (ref 3.87–5.11)
RDW: 12.2 % (ref 11.5–15.5)
WBC: 9.4 10*3/uL (ref 4.0–10.5)
nRBC: 0 % (ref 0.0–0.2)

## 2023-12-29 LAB — BASIC METABOLIC PANEL WITH GFR
Anion gap: 11 (ref 5–15)
BUN: 18 mg/dL (ref 6–20)
CO2: 25 mmol/L (ref 22–32)
Calcium: 9.2 mg/dL (ref 8.9–10.3)
Chloride: 104 mmol/L (ref 98–111)
Creatinine, Ser: 0.63 mg/dL (ref 0.44–1.00)
GFR, Estimated: >60 mL/min (ref >60)
Glucose, Bld: 112 mg/dL — ABNORMAL HIGH (ref 70–99)
Potassium: 2.8 mmol/L — ABNORMAL LOW (ref 3.5–5.1)
Sodium: 140 mmol/L (ref 135–145)

## 2023-12-29 LAB — D-DIMER, QUANTITATIVE: D-Dimer, Quant: 0.3 ug{FEU}/mL (ref 0.00–0.50)

## 2023-12-29 LAB — TROPONIN I (HIGH SENSITIVITY)
Troponin I (High Sensitivity): 7 ng/L (ref <18)
Troponin I (High Sensitivity): 7 ng/L (ref <18)

## 2023-12-29 MED ORDER — POTASSIUM CHLORIDE CRYS ER 20 MEQ PO TBCR
40.0000 meq | EXTENDED_RELEASE_TABLET | Freq: Once | ORAL | Status: AC
  Administered 2023-12-29: 40 meq via ORAL
  Filled 2023-12-29: qty 2

## 2023-12-29 NOTE — ED Provider Notes (Signed)
 [place] EMERGENCY DEPARTMENT AT [facility] Provider Note   CSN: [id number] Arrival date & time: 12/29/23  4098     History  Chief Complaint  Patient presents with   Palpitations    Tina Hunt is a 48 y.o. female.  This is a 48 year old female presenting emergency department for palpitations.  Reports having some nausea and vomiting yesterday and developed gradual onset headache.  Took her migraine medications.  This is a typical headache for her.  Has some lingering headache.  But she notes that last night had palpitations and some right-sided chest tightness.  No near-syncope or syncope.  No abdominal pain.  Symptoms improved currrently.    Palpitations      Home Medications Prior to Admission medications   Medication Sig Start Date End Date Taking? Authorizing Provider  acetaminophen (TYLENOL) 500 MG tablet Take 1,000 mg by mouth every 6 (six) hours as needed for moderate pain.    [provider]  diphenhydrAMINE (BENADRYL) 25 mg capsule Take 25 mg by mouth every 6 (six) hours as needed for allergies.    [provider]  diphenhydramine-acetaminophen (TYLENOL PM) 25-500 MG TABS tablet Take 1 tablet by mouth at bedtime as needed.    [provider]  DULoxetine (CYMBALTA) 30 MG capsule Take 60 mg by mouth 2 (two) times daily. 01/20/23   [provider]  Fremanezumab-vfrm (AJOVY) 225 MG/1.5ML SOAJ Inject 225 mg into the skin every 30 (thirty) days. 05/08/23   Glean Salvo, NP  gabapentin (NEURONTIN) 600 MG tablet Take 600 mg by mouth 3 (three) times daily. 01/24/23   [provider]  lisinopril (ZESTRIL) 5 MG tablet Take 1 tablet (5 mg total) by mouth daily. 07/26/23   Melene Plan, DO  omeprazole (PRILOSEC) 20 MG capsule Take 20 mg by mouth 2 (two) times daily before a meal.  12/19/17   [provider]  ondansetron (ZOFRAN-ODT) 4 MG disintegrating tablet DISSOLVE 1 TABLET ON TONGUE EVERY 8 HOURS AS  NEEDED. Patient taking differently: Take 4 mg by mouth every 8 (eight) hours as needed for refractory nausea / vomiting. DISSOLVE 1 TABLET ON TONGUE EVERY 8 HOURS AS NEEDED. 12/28/21   Glean Salvo, NP  Oxycodone HCl 10 MG TABS Take 10 mg by mouth 4 (four) times daily as needed. 07/17/23   [provider]  polyethylene glycol (MIRALAX / GLYCOLAX) 17 g packet Take 17 g by mouth 2 (two) times daily. 02/22/23   Cassandria Anger, PA-C  Rimegepant Sulfate (NURTEC) 75 MG TBDP Take 1 tablet (75 mg total) by mouth as needed (Take 1 at onset of headache, max is 1 tablet in 24 hours). 05/08/23   Glean Salvo, NP  SUMAtriptan (IMITREX STATDOSE SYSTEM) 6 MG/0.5ML SOAJ Inject 0.5 mLs into the skin as needed (use if maxalt fails). 05/08/23   Glean Salvo, NP  tiZANidine (ZANAFLEX) 4 MG tablet Take 4 mg by mouth 3 (three) times daily.    [provider]  topiramate (TOPAMAX) 50 MG tablet Take 50 mg by mouth 2 (two) times daily. 01/19/23   [provider]      Allergies    Tape, Aspirin, Oseltamivir, Biaxin [clarithromycin], Nsaids, and Penicillins    Review of Systems   Review of Systems  Cardiovascular:  Positive for palpitations.    Physical Exam Updated Vital Signs BP (!) 170/102   Pulse 65   Temp 98.1 F (36.7 C)   Resp (!) 24   Ht 5\' 2"  (  1.575 m)   Wt 102.1 kg   LMP  (LMP Unknown)   SpO2 99%   BMI 41.15 kg/m  Physical Exam Vitals and nursing note reviewed.  Constitutional:      General: She is not in acute distress.    Appearance: She is not toxic-appearing.  HENT:     Nose: Nose normal.     Mouth/Throat:     Mouth: Mucous membranes are moist.  Eyes:     Conjunctiva/sclera: Conjunctivae normal.  Cardiovascular:     Rate and Rhythm: Normal rate and regular rhythm.  Pulmonary:     Effort: Pulmonary effort is normal.  Abdominal:     General: Abdomen is flat. There is no distension.     Tenderness: There is no abdominal tenderness. There is no guarding or  rebound.  Skin:    General: Skin is warm and dry.     Capillary Refill: Capillary refill takes less than 2 seconds.  Neurological:     Mental Status: She is alert and oriented to person, place, and time.  Psychiatric:        Mood and Affect: Mood normal.        Behavior: Behavior normal.     ED Results / Procedures / Treatments   Labs (all labs ordered are listed, but only abnormal results are displayed) Labs Reviewed  BASIC METABOLIC PANEL WITH GFR - Abnormal; Notable for the following components:      Result Value   Potassium 2.8 (*)    Glucose, Bld 112 (*)    All other components within normal limits  CBC - Abnormal; Notable for the following components:   RBC 3.73 (*)    MCH 35.7 (*)    MCHC 36.8 (*)    All other components within normal limits  D-DIMER, QUANTITATIVE  TROPONIN I (HIGH SENSITIVITY)  TROPONIN I (HIGH SENSITIVITY)    EKG EKG Interpretation Date/Time:  Friday December 29 2023 09:49:20 EDT Ventricular Rate:  99 PR Interval:  158 QRS Duration:  78 QT Interval:  338 QTC Calculation: 433 R Axis:   -40  Text Interpretation: Normal sinus rhythm Left axis deviation Anterior infarct (cited on or before 01-Oct-2021) Abnormal ECG When compared with ECG of 26-Jul-2023 15:02, QRS axis Shifted left Nonspecific T wave abnormality no longer evident in Lateral leads Confirmed by Estanislado Pandy [phone]) on 12/29/2023 10:20:51 AM  Radiology DG Chest 2 View Result Date: 12/29/2023 CLINICAL DATA:  Tachycardia. EXAM: CHEST - 2 VIEW COMPARISON:  07/26/2023. FINDINGS: Bilateral lung fields are clear. Bilateral costophrenic angles are clear. Normal cardio-mediastinal silhouette. No acute osseous abnormalities. The soft tissues are within normal limits. IMPRESSION: No active cardiopulmonary disease. Electronically Signed   By: Jules Schick M.D.   On: 12/29/2023 11:02    Procedures Procedures    Medications Ordered in ED Medications  potassium chloride SA (KLOR-CON M) CR tablet  40 mEq (40 mEq Oral Given 12/29/23 1146)    ED Course/ Medical Decision Making/ A&P Clinical Course as of 12/29/23 1433  Fri Dec 29, 2023  1128 DG Chest 2 View IMPRESSION: No active cardiopulmonary disease.   [TY]  1132 Troponin I (High Sensitivity): 7 Re-assuring [TY]  1132 Potassium(!): 2.8 Will replete [TY]  1132 WBC: 9.4 Systemic infection less likely.  [TY]    Clinical Course User Index [TY] Coral Spikes, DO  Medical Decision Making Presented for palpitations.  She is afebrile, slightly elevated heart rate on arrival to 103.  Hemodynamically stable.  Does not appear to be in significant distress.  Reassuring neuroexam.  EKG on my independent interpretation appears to be normal sinus rhythm without ST segment changes to indicate ischemia.  She appears to be normal sinus rhythm on the monitor heart rate in the mid 70s on my independent interpretation.  Troponins negative x 2.  ACS less likely.  Given elevated heart rate concern for possible PE.  However D-dimer is negative.  Patient otherwise without risk.  Does have low potassium on her basic metabolic panel, but no kidney injury.  Benign abdominal exam.  Low suspicion for acute hepatobiliary disease.  CBC without leukocytosis that would suggest systemic infection.  Patient stable for discharge.  Follow-up with cardiology.   Amount and/or Complexity of Data Reviewed Labs: ordered. Decision-making details documented in ED Course. Radiology: ordered. Decision-making details documented in ED Course.  Risk Prescription drug management.         Final Clinical Impression(s) / ED Diagnoses Final diagnoses:  None    Rx / DC Orders ED Discharge Orders     None         Coral Spikes, DO 12/29/23 1433

## 2023-12-29 NOTE — Discharge Instructions (Addendum)
 Please follow-up with your primary doctor and your cardiologist.  Your potassium was low, please follow-up with your primary doctor for recheck.  Return immediately for left fevers, chills, worsening chest pain, shortness of breath, lightheadedness, you pass out, feel your heart is racing again or you develop any new or worsening symptoms that are concerning to you.

## 2023-12-29 NOTE — ED Triage Notes (Signed)
 Pt presents to ED from home C/O "heart pounding" and chest pressure since last night. HR 100-110 in triage.

## 2024-03-27 NOTE — Patient Instructions (Signed)
 SURGICAL WAITING ROOM VISITATION Patients having surgery or a procedure may have no more than 2 support people in the waiting area - these visitors may rotate in the visitor waiting room.   If the patient needs to stay at the hospital during part of their recovery, the visitor guidelines for inpatient rooms apply.  PRE-OP VISITATION  Pre-op nurse will coordinate an appropriate time for 1 support person to accompany the patient in pre-op.  This support person may not rotate.  This visitor will be contacted when the time is appropriate for the visitor to come back in the pre-op area.  Please refer to the [facility] website for the visitor guidelines for Inpatients (after your surgery is over and you are in a regular room).  You are not required to quarantine at this time prior to your surgery. However, you must do this: Hand Hygiene often Do NOT share personal items Notify your provider if you are in close contact with someone who has COVID or you develop fever 100.4 or greater, new onset of sneezing, cough, sore throat, shortness of breath or body aches.  If you test positive for Covid or have been in contact with anyone that has tested positive in the last 10 days please notify you surgeon.    Your procedure is scheduled on:  TUESDAY  April 02, 2024  Report to [facility] Main Entrance: [address] entrance where the [employer] is available.   Report to admitting at:  11:15   AM  Call this number if you have any questions or problems the morning of surgery [phone]  Do not eat food after Midnight the night prior to your surgery/procedure.  After Midnight you may have the following liquids until   10:45  AM DAY OF SURGERY  Clear Liquid Diet Water  Black Coffee (sugar ok, NO MILK/CREAM OR CREAMERS)  Tea (sugar ok, NO MILK/CREAM OR CREAMERS) regular and decaf                             Plain Jell-O  with no fruit (NO RED)                                           Fruit ices  (not with fruit pulp, NO RED)                                     Popsicles (NO RED)                                                                  Juice: NO CITRUS JUICES: only apple, WHITE grape, WHITE cranberry Sports drinks like Gatorade or Powerade (NO RED)                   The day of surgery:  Drink ONE (1) Pre-Surgery Clear Ensure at  10:45 AM the morning of surgery. Drink in one sitting. Do not sip.  This drink was given to you during your hospital pre-op appointment visit. Nothing else to drink  after completing the Pre-Surgery Clear Ensure : No candy, chewing gum or throat lozenges.    FOLLOW ANY ADDITIONAL PRE OP INSTRUCTIONS YOU RECEIVED FROM YOUR SURGEON'S OFFICE!!!   Oral Hygiene is also important to reduce your risk of infection.        Remember - BRUSH YOUR TEETH THE MORNING OF SURGERY WITH YOUR REGULAR TOOTHPASTE  Do NOT smoke after Midnight the night before surgery.  STOP TAKING all Vitamins, Herbs and supplements 1 week before your surgery.   Take ONLY these medicines the morning of surgery with A SIP OF WATER : omeprazole, bupropion, topiramate , gabapentin  and Buspar if needed. You may take EITHER Tylenol  or Oxycodone  HCI if needed depending on pain level.                     You may not have any metal on your body including hair pins, jewelry, and body piercing  Do not wear make-up, lotions, powders, perfumes  or deodorant  Do not wear nail polish including gel and S&S, artificial / acrylic nails, or any other type of covering on natural nails including finger and toenails. If you have artificial nails, gel coating, etc., that needs to be removed by a nail salon, Please have this removed prior to surgery. Not doing so may mean that your surgery could be cancelled or delayed if the Surgeon or anesthesia staff feels like they are unable to monitor you safely.   Do not shave 48 hours prior to surgery to avoid nicks in your skin which may contribute to postoperative  infections.    Contacts, Hearing Aids, dentures or bridgework may not be worn into surgery. DENTURES WILL BE REMOVED PRIOR TO SURGERY PLEASE DO NOT APPLY Poly grip OR ADHESIVES!!!  You may bring a small overnight bag with you on the day of surgery, only pack items that are not valuable. [facility] IS NOT RESPONSIBLE   FOR VALUABLES THAT ARE LOST OR STOLEN.   Do not bring your home medications to the hospital. The Pharmacy will dispense medications listed on your medication list to you during your admission in the Hospital.  Please read over the following fact sheets you were given: IF YOU HAVE QUESTIONS ABOUT YOUR PRE-OP INSTRUCTIONS, PLEASE CALL [phone].     Pre-operative 5 CHG Bath Instructions   You can play a key role in reducing the risk of infection after surgery. Your skin needs to be as free of germs as possible. You can reduce the number of germs on your skin by washing with CHG (chlorhexidine  gluconate) soap before surgery. CHG is an antiseptic soap that kills germs and continues to kill germs even after washing.   DO NOT use if you have an allergy to chlorhexidine /CHG or antibacterial soaps. If your skin becomes reddened or irritated, stop using the CHG and notify one of our RNs at [phone]  Please shower with the CHG soap starting 4 days before surgery using the following schedule: START SHOWERS ON   FRIDAY  March 29, 2024  Please keep in mind the following:  DO NOT shave, including legs and underarms, starting the day of your first shower.   You may shave your face at any point before/day of surgery.   Place clean sheets on your bed the day you start using CHG soap. Use a clean washcloth (not used since being washed) for each shower. DO NOT sleep with pets once you start using the CHG.   CHG  Shower Instructions:  If you choose to wash your hair and private area, wash first with your normal shampoo/soap.  After you use shampoo/soap, rinse your hair and body thoroughly to remove shampoo/soap residue.  Turn the water  OFF and apply about 3 tablespoons (45 ml) of CHG soap to a CLEAN washcloth.  Apply CHG soap ONLY FROM YOUR NECK DOWN TO YOUR TOES (washing for 3-5 minutes)  DO NOT use CHG soap on face, private areas, open wounds, or sores.  Pay special attention to the area where your surgery is being performed.  If you are having back surgery, having someone wash your back for you may be helpful.  Wait 2 minutes after CHG soap is applied, then you may rinse off the CHG soap.  Pat dry with a clean towel  Put on clean clothes/pajamas   If you choose to wear lotion, please use ONLY the CHG-compatible lotions on the back of this paper.     Additional instructions for the day of surgery: DO NOT APPLY any lotions, deodorants, cologne, or perfumes.   Put on clean/comfortable clothes.  Brush your teeth.  Ask your nurse before applying any prescription medications to the skin.      CHG Compatible Lotions   Aveeno Moisturizing lotion  Cetaphil Moisturizing Cream  Cetaphil Moisturizing Lotion  Clairol Herbal Essence Moisturizing Lotion, Dry Skin  Clairol Herbal Essence Moisturizing Lotion, Extra Dry Skin  Clairol Herbal Essence Moisturizing Lotion, Normal Skin  Curel Age Defying Therapeutic Moisturizing Lotion with Alpha Hydroxy  Curel Extreme Care Body Lotion  Curel Soothing Hands Moisturizing Hand Lotion  Curel Therapeutic Moisturizing Cream, Fragrance-Free  Curel Therapeutic Moisturizing Lotion, Fragrance-Free  Curel Therapeutic Moisturizing Lotion, Original Formula  Eucerin Daily Replenishing Lotion  Eucerin Dry Skin Therapy Plus Alpha Hydroxy Crme  Eucerin Dry Skin Therapy Plus Alpha Hydroxy Lotion  Eucerin Original Crme  Eucerin Original Lotion  Eucerin Plus Crme  Eucerin Plus Lotion  Eucerin TriLipid Replenishing Lotion  Keri Anti-Bacterial Hand Lotion  Keri Deep Conditioning Original Lotion Dry Skin Formula Softly Scented  Keri Deep Conditioning Original Lotion, Fragrance Free Sensitive Skin Formula  Keri Lotion Fast Absorbing Fragrance Free Sensitive Skin Formula  Keri Lotion Fast Absorbing Softly Scented Dry Skin Formula  Keri Original Lotion  Keri Skin Renewal Lotion Keri Silky Smooth Lotion  Keri Silky Smooth Sensitive Skin Lotion  Nivea Body Creamy Conditioning Oil  Nivea Body Extra Enriched Lotion  Nivea Body Original Lotion  Nivea Body Sheer Moisturizing Lotion Nivea Crme  Nivea Skin Firming Lotion  NutraDerm 30 Skin Lotion  NutraDerm Skin Lotion  NutraDerm Therapeutic Skin Cream  NutraDerm Therapeutic Skin Lotion  ProShield Protective Hand Cream  Provon moisturizing lotion   FAILURE TO FOLLOW THESE INSTRUCTIONS MAY RESULT IN THE CANCELLATION OF YOUR SURGERY  PATIENT SIGNATURE_________________________________  NURSE SIGNATURE__________________________________  ________________________________________________________________________        Tina Hunt    An incentive spirometer is a tool that can help keep your lungs clear and active. This tool measures how well you are filling your lungs with each breath.  Taking long deep breaths may help reverse or decrease the chance of developing breathing (pulmonary) problems (especially infection) following: A long period of time when you are unable to move or be active. BEFORE THE PROCEDURE  If the spirometer includes an indicator to show your best effort, your nurse or respiratory therapist will set it to a desired goal. If possible, sit up straight or lean slightly forward. Try not to slouch. Hold the incentive spirometer in an upright position. INSTRUCTIONS FOR USE  Sit on the edge of your bed if possible, or sit up as far as you can in bed or on a chair. Hold the  incentive spirometer in an upright position. Breathe out normally. Place the mouthpiece in your mouth and seal your lips tightly around it. Breathe in slowly and as deeply as possible, raising the piston or the ball toward the top of the column. Hold your breath for 3-5 seconds or for as long as possible. Allow the piston or ball to fall to the bottom of the column. Remove the mouthpiece from your mouth and breathe out normally. Rest for a few seconds and repeat Steps 1 through 7 at least 10 times every 1-2 hours when you are awake. Take your time and take a few normal breaths between deep breaths. The spirometer may include an indicator to show your best effort. Use the indicator as a goal to work toward during each repetition. After each set of 10 deep breaths, practice coughing to be sure your lungs are clear. If you have an incision (the cut made at the time of surgery), support your incision when coughing by placing a pillow or rolled up towels firmly against it. Once you are able to get out of bed, walk around indoors and cough well. You may stop using the incentive spirometer when instructed by your caregiver.  RISKS AND COMPLICATIONS Take your time so you do not get dizzy or light-headed. If you are in pain, you may need to take or ask for pain medication before doing incentive spirometry. It is harder to take a deep breath if you are having pain. AFTER USE Rest and breathe slowly and easily. It can be helpful to keep track of a log of your progress. Your caregiver can provide you with a simple table to help with this. If you are using the spirometer at home, follow these instructions: SEEK MEDICAL CARE IF:  You are having difficultly using the spirometer. You have trouble using the spirometer as often as instructed. Your pain medication is not giving enough relief while using the spirometer. You develop fever of 100.5 F (38.1 C) or higher.                                                                                                     SEEK IMMEDIATE MEDICAL CARE IF:  You cough up bloody sputum that had not been present before. You develop fever of 102 F (38.9 C) or greater. You develop worsening pain at or near the incision site. MAKE SURE YOU:  Understand these instructions. Will watch your  condition. Will get help right away if you are not doing well or get worse. Document Released: 01/23/2007 Document Revised: 12/05/2011 Document Reviewed: 03/26/2007 [facility] Patient Information 2014 [place], [place].         WHAT IS A BLOOD TRANSFUSION? Blood Transfusion Information  A transfusion is the replacement of blood or some of its parts. Blood is made up of multiple cells which provide different functions. Red blood cells carry oxygen and are used for blood loss replacement. White blood cells fight against infection. Platelets control bleeding. Plasma helps clot blood. Other blood products are available for specialized needs, such as hemophilia or other clotting disorders. BEFORE THE TRANSFUSION  Who gives blood for transfusions?  Healthy volunteers who are fully evaluated to make sure their blood is safe. This is blood bank blood. Transfusion therapy is the safest it has ever been in the practice of medicine. Before blood is taken from a donor, a complete history is taken to make sure that person has no history of diseases nor engages in risky social behavior (examples are intravenous drug use or sexual activity with multiple partners). The donor's travel history is screened to minimize risk of transmitting infections, such as malaria. The donated blood is tested for signs of infectious diseases, such as HIV and hepatitis. The blood is then tested to be sure it is compatible with you in order to minimize the chance of a transfusion reaction. If you or a relative donates blood, this is often done in anticipation of surgery and is not appropriate for emergency  situations. It takes many days to process the donated blood. RISKS AND COMPLICATIONS Although transfusion therapy is very safe and saves many lives, the main dangers of transfusion include:  Getting an infectious disease. Developing a transfusion reaction. This is an allergic reaction to something in the blood you were given. Every precaution is taken to prevent this. The decision to have a blood transfusion has been considered carefully by your caregiver before blood is given. Blood is not given unless the benefits outweigh the risks. AFTER THE TRANSFUSION Right after receiving a blood transfusion, you will usually feel much better and more energetic. This is especially true if your red blood cells have gotten low (anemic). The transfusion raises the level of the red blood cells which carry oxygen, and this usually causes an energy increase. The nurse administering the transfusion will monitor you carefully for complications. HOME CARE INSTRUCTIONS  No special instructions are needed after a transfusion. You may find your energy is better. Speak with your caregiver about any limitations on activity for underlying diseases you may have. SEEK MEDICAL CARE IF:  Your condition is not improving after your transfusion. You develop redness or irritation at the intravenous (IV) site. SEEK IMMEDIATE MEDICAL CARE IF:  Any of the following symptoms occur over the next 12 hours: Shaking chills. You have a temperature by mouth above 102 F (38.9 C), not controlled by medicine. Chest, back, or muscle pain. People around you feel you are not acting correctly or are confused. Shortness of breath or difficulty breathing. Dizziness and fainting. You get a rash or develop hives. You have a decrease in urine output. Your urine turns a dark color or changes to pink, red, or brown. Any of the following symptoms occur over the next 10 days: You have a temperature by mouth above 102 F (38.9 C), not controlled  by medicine. Shortness of breath. Weakness after normal activity. The white part of the eye turns yellow (  jaundice). You have a decrease in the amount of urine or are urinating less often. Your urine turns a dark color or changes to pink, red, or brown. Document Released: 09/09/2000 Document Revised: 12/05/2011 Document Reviewed: 04/28/2008 [facility] Patient Information 2014 [place], [place].  _______________________________________________________________________         If you would like to see a video about joint replacement:   [email or web address]

## 2024-03-27 NOTE — Progress Notes (Signed)
 COVID Vaccine received:  []  No [x]  Yes Date of any COVID positive Test in last 90 days:  none  PCP - Comer GAILS. Hemberg, NP at [facility] [phone] (Work)  [phone] (Fax)  clearance scanned to media and in 03-14-24 note Cardiologist - none Pain Mgmt- Charlie Dolores, MD   Chest x-ray - 12-29-2023  2v  Epic EKG - 12-29-2023  Epic  Stress Test -  ECHO -  Cardiac Cath -  CT Coronary Calcium score:   Pacemaker / ICD device [x]  No []  Yes   Spinal Cord Stimulator:[x]  No []  Yes       History of Sleep Apnea? []  No [x]  Yes   CPAP used?- [x]  No []  Yes  lost weight  Does the patient monitor blood sugar?   [x]  N/A   []  No []  Yes  Patient has: [x]  NO Hx DM   []  Pre-DM   []  DM1  []   DM2  Blood Thinner / Instructions:  none Aspirin  Instructions:  none  ERAS Protocol Ordered: []  No  [x]  Yes PRE-SURGERY [x]  ENSURE  []  G2    Patient is to be NPO after: 1045  Dental hx: []  Dentures:  [x]  N/A      []  Bridge or Partial:                   []  Loose or Damaged teeth:   Comments: Patient was given the 5 CHG shower / bath instructions for THA surgery along with 2 bottles of the CHG soap. Patient will start this on: 03-29-2024             Activity level: Able to walk up 2 flights of stairs without becoming significantly short of breath or having chest pain?  []  No   [x]    Yes but have severe leg pain  Anesthesia review: PONV, HTN, Palps ([facility] 12-29-23, ref to cardiology, no appt yet. She had New Pt appt w/ Dr. 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  Patient verbalized understanding and agreement to the Pre-Surgical Instructions that were given to them at this PAT appointment. Patient was also educated of the need to review  these PAT instructions again prior to her surgery.I reviewed the appropriate phone numbers to call if they have any and questions or concerns.

## 2024-03-28 ENCOUNTER — Encounter (HOSPITAL_COMMUNITY)
Admission: RE | Admit: 2024-03-28 | Discharge: 2024-03-28 | Disposition: A | Discharge status: Home / Self Care | Source: Ambulatory Visit | Attending: Orthopedic Surgery | Admitting: Orthopedic Surgery

## 2024-03-28 ENCOUNTER — Encounter (HOSPITAL_COMMUNITY): Payer: Self-pay

## 2024-03-28 ENCOUNTER — Other Ambulatory Visit: Payer: Self-pay

## 2024-03-28 VITALS — BP 122/78 | HR 70 | Temp 98.6°F | Resp 20 | Ht 62.0 in | Wt 210.0 lb

## 2024-03-28 DIAGNOSIS — Z79891 Long term (current) use of opiate analgesic: Secondary | ICD-10-CM | POA: Insufficient documentation

## 2024-03-28 DIAGNOSIS — G8929 Other chronic pain: Secondary | ICD-10-CM | POA: Insufficient documentation

## 2024-03-28 DIAGNOSIS — Z01812 Encounter for preprocedural laboratory examination: Secondary | ICD-10-CM | POA: Diagnosis present

## 2024-03-28 DIAGNOSIS — I10 Essential (primary) hypertension: Secondary | ICD-10-CM

## 2024-03-28 DIAGNOSIS — G4733 Obstructive sleep apnea (adult) (pediatric): Secondary | ICD-10-CM | POA: Diagnosis not present

## 2024-03-28 DIAGNOSIS — M1612 Unilateral primary osteoarthritis, left hip: Secondary | ICD-10-CM | POA: Insufficient documentation

## 2024-03-28 DIAGNOSIS — Z9884 Bariatric surgery status: Secondary | ICD-10-CM | POA: Insufficient documentation

## 2024-03-28 DIAGNOSIS — Z6838 Body mass index (BMI) 38.0-38.9, adult: Secondary | ICD-10-CM | POA: Diagnosis not present

## 2024-03-28 DIAGNOSIS — K219 Gastro-esophageal reflux disease without esophagitis: Secondary | ICD-10-CM | POA: Insufficient documentation

## 2024-03-28 DIAGNOSIS — E669 Obesity, unspecified: Secondary | ICD-10-CM | POA: Diagnosis not present

## 2024-03-28 DIAGNOSIS — Z01818 Encounter for other preprocedural examination: Secondary | ICD-10-CM

## 2024-03-28 LAB — COMPREHENSIVE METABOLIC PANEL WITH GFR
ALT: 20 U/L (ref 0–44)
AST: 32 U/L (ref 15–41)
Albumin: 4.2 g/dL (ref 3.5–5.0)
Alkaline Phosphatase: 84 U/L (ref 38–126)
Anion gap: 11 (ref 5–15)
BUN: 24 mg/dL — ABNORMAL HIGH (ref 6–20)
CO2: 23 mmol/L (ref 22–32)
Calcium: 9.3 mg/dL (ref 8.9–10.3)
Chloride: 104 mmol/L (ref 98–111)
Creatinine, Ser: 1.01 mg/dL — ABNORMAL HIGH (ref 0.44–1.00)
GFR, Estimated: >60 mL/min (ref >60)
Glucose, Bld: 91 mg/dL (ref 70–99)
Potassium: 3.4 mmol/L — ABNORMAL LOW (ref 3.5–5.1)
Sodium: 138 mmol/L (ref 135–145)
Total Bilirubin: 0.5 mg/dL (ref 0.0–1.2)
Total Protein: 7.1 g/dL (ref 6.5–8.1)

## 2024-03-28 LAB — CBC
HCT: 35.2 % — ABNORMAL LOW (ref 36.0–46.0)
Hemoglobin: 12.3 g/dL (ref 12.0–15.0)
MCH: 34.9 pg — ABNORMAL HIGH (ref 26.0–34.0)
MCHC: 34.9 g/dL (ref 30.0–36.0)
MCV: 100 fL (ref 80.0–100.0)
Platelets: 252 10*3/uL (ref 150–400)
RBC: 3.52 MIL/uL — ABNORMAL LOW (ref 3.87–5.11)
RDW: 12.1 % (ref 11.5–15.5)
WBC: 8.7 10*3/uL (ref 4.0–10.5)
nRBC: 0 % (ref 0.0–0.2)

## 2024-03-28 LAB — SURGICAL PCR SCREEN
MRSA, PCR: NEGATIVE
Staphylococcus aureus: POSITIVE — AB

## 2024-03-28 NOTE — Progress Notes (Signed)
 Patient's PCR screen is positive for STAPH. Appropriate notes have been placed on the patient's chart. This note has been routed to Dr. Ernie and Rosina Calin, PA for review. The Patient's surgery is currently scheduled for: 04-02-24 at [facility].  Shawnee Aloe, BSN, CVRN-BC   Pre-Surgical Testing Nurse [facility]  [phone]

## 2024-04-01 ENCOUNTER — Encounter (HOSPITAL_COMMUNITY): Payer: Self-pay

## 2024-04-01 NOTE — Anesthesia Preprocedure Evaluation (Signed)
 Anesthesia Evaluation  Patient identified by MRN, date of birth, ID band Patient awake    Reviewed: Allergy & Precautions, H&P , NPO status , Patient's Chart, lab work & pertinent test results  History of Anesthesia Complications (+) PONV and history of anesthetic complications  Airway Mallampati: II  TM Distance: >3 FB Neck ROM: Full    Dental no notable dental hx. (+) Teeth Intact, Dental Advisory Given   Pulmonary sleep apnea    Pulmonary exam normal breath sounds clear to auscultation       Cardiovascular negative cardio ROS  Rhythm:Regular Rate:Normal     Neuro/Psych  Headaches  Anxiety Depression       GI/Hepatic Neg liver ROS,GERD  Medicated,,  Endo/Other    Class 3 obesity  Renal/GU negative Renal ROS  negative genitourinary   Musculoskeletal  (+) Arthritis , Osteoarthritis,    Abdominal   Peds  Hematology negative hematology ROS (+)   Anesthesia Other Findings   Reproductive/Obstetrics negative OB ROS                              Anesthesia Physical Anesthesia Plan  ASA: 3  Anesthesia Plan: Spinal   Post-op Pain Management: Ofirmev  IV (intra-op)*   Induction: Intravenous  PONV Risk Score and Plan: 4 or greater and Dexamethasone , Propofol  infusion and Midazolam   Airway Management Planned: Natural Airway and Simple Face Mask  Additional Equipment:   Intra-op Plan:   Post-operative Plan:   Informed Consent: I have reviewed the patients History and Physical, chart, labs and discussed the procedure including the risks, benefits and alternatives for the proposed anesthesia with the patient or authorized representative who has indicated his/her understanding and acceptance.     Dental advisory given  Plan Discussed with: CRNA  Anesthesia Plan Comments: (See PAT note from 7/3)         Anesthesia Quick Evaluation

## 2024-04-01 NOTE — Progress Notes (Signed)
 Case: [id number] Date/Time: 04/02/24 1330   Procedure: ARTHROPLASTY, HIP, TOTAL, ANTERIOR APPROACH (Left: Hip)   Anesthesia type: Spinal   Diagnosis: Primary osteoarthritis of left hip [M16.12]   Pre-op diagnosis: Left hip osteoarthritis   Location: [facility] ROOM 10 / [facility]   Surgeons: Ernie Cough, MD       DISCUSSION: Tina Hunt is a 48 yo female who presents to PAT prior to surgery above. PMH of OSA (no CPAP use), migraines, chronic ear infections s/p bilateral ear tubes, GERD, s/p gastric sleeve, anxiety, depression, chronic pain with narcotic dependence, obesity BMI 38), arthritis  Prior anesthesia complications include PONV  Patient seen in the emergency department on 12/29/23 for palpitations.  Potassium was noted to be 2.8 and was replaced.  She was advised to follow-up with cardiology but has not had a follow-up appointment.    Patient has followed up with her PCP.  Was last seen on 03/14/2024 for preop clearance.  All issues stable.  Cleared for surgery  VS: BP 122/78 Comment: right arm sitting  Pulse 70   Temp 37 C (Oral)   Resp 20   Ht 5' 2 (1.575 m)   Wt 95.3 kg   LMP  (LMP Unknown)   SpO2 98%   BMI 38.41 kg/m   PROVIDERS: Barbra Odor, NP   LABS: Labs reviewed: Acceptable for surgery. Mild hypokalemia (all labs ordered are listed, but only abnormal results are displayed)  Labs Reviewed  SURGICAL PCR SCREEN - Abnormal; Notable for the following components:      Result Value   Staphylococcus aureus POSITIVE (*)    All other components within normal limits  COMPREHENSIVE METABOLIC PANEL WITH GFR - Abnormal; Notable for the following components:   Potassium 3.4 (*)    BUN 24 (*)    Creatinine, Ser 1.01 (*)    All other components within normal limits  CBC - Abnormal; Notable for the following components:   RBC 3.52 (*)    HCT 35.2 (*)    MCH 34.9 (*)    All other components within normal limits  TYPE AND SCREEN     IMAGES: Chest x-ray  12/29/23:  FINDINGS: Bilateral lung fields are clear. Bilateral costophrenic angles are clear.   Normal cardio-mediastinal silhouette.   No acute osseous abnormalities.   The soft tissues are within normal limits.   IMPRESSION: No active cardiopulmonary disease.  EKG 12/29/2023: Normal sinus rhythm, rate 99 Left axis deviation Anterior infarct (cited on or before 01-Oct-2021  CV:  Past Medical History:  Diagnosis Date   Anxiety    Arthritis    a little in Right knee   Chronic back pain    Depression    Endometriosis    GERD (gastroesophageal reflux disease)    Hallux varus (acquired), right foot    Headache(784.0)    MIGRAINES    History of blood transfusion 01/10/2003   [facility] - 3 units transfused   with c section   Migraine    Neck pain    clench teeth at night   Neuromuscular disorder (HCC)    BLACKOUT SPELLS  BUT NONE SINCE 1996    Pneumonia    hx of 3 years ago   PONV (postoperative nausea and vomiting)    with tonsils as child, no problems as adult   Recurrent sinusitis    Sleep apnea    Does not use CPAP  due yo weight loss   Tuberculosis 1998   TB + SKIN TEST, CXR was  normal    Past Surgical History:  Procedure Laterality Date   ADENOIDECTOMY  1995   with tube placement   CESAREAN SECTION     2004    CHROMOPERTUBATION Bilateral 10/09/2015   Procedure: CHROMOPERTUBATION;  Surgeon: Jon Rummer, MD;  Location: [facility];  Service: Gynecology;  Laterality: Bilateral;   CYSTOSCOPY Bilateral 03/20/2017   Procedure: CYSTOSCOPY;  Surgeon: Rummer Jon, MD;  Location: [facility];  Service: Gynecology;  Laterality: Bilateral;   DILITATION & CURRETTAGE/HYSTROSCOPY WITH NOVASURE ABLATION N/A 10/09/2015   Procedure: DILATATION & CURETTAGE/HYSTEROSCOPY;  Surgeon: Jon Rummer, MD;  Location: [facility];  Service: Gynecology;  Laterality: N/A;   KNEE ARTHROSCOPY Left    1988    KNEE ARTHROSCOPY Left 03/12/2014   Procedure: LEFT ARTHROSCOPY KNEE WITH DEBRIDEMENT;   Surgeon: Dempsey Melodi GAILS, MD;  Location: [facility];  Service: Orthopedics;  Laterality: Left;  medical and lateral repair menius   LAPAROSCOPIC OVARIAN CYSTECTOMY Right 10/09/2015   Procedure: LAPAROSCOPIC RIGHT OVARIAN CYSTECTOMIES, LEFT PARATUBAL CYSTECTOMY ;  Surgeon: Jon Rummer, MD;  Location: [facility];  Service: Gynecology;  Laterality: Right;   LIGAMENT REPAIR Right 09/14/2018   Procedure: Right hallux and 2nd metatarsal phalangeal joint collateral ligament and plantar plate repair;  Surgeon: Kit Rush, MD;  Location: [facility];  Service: Orthopedics;  Laterality: Right;    SLEEVE GASTROPLASTY     TONSILLECTOMY  1983, 1985   ADENOID X2 (SEPARATE SURGERY)  TUBE PLACEMENT   TOTAL HIP ARTHROPLASTY Right 02/21/2023   Procedure: TOTAL HIP ARTHROPLASTY ANTERIOR APPROACH;  Surgeon: Ernie Cough, MD;  Location: [facility];  Service: Orthopedics;  Laterality: Right;   TOTAL LAPAROSCOPIC HYSTERECTOMY WITH SALPINGECTOMY Bilateral 03/20/2017   Procedure: HYSTERECTOMY TOTAL LAPAROSCOPIC WITH BILATERAL SALPINGECTOMY;  Surgeon: Rummer Jon, MD;  Location: [facility];  Service: Gynecology;  Laterality: Bilateral;   TYMPANOSTOMY TUBE PLACEMENT  1989, 1991, 1999, 2002, 2006, 2009, 2013    MEDICATIONS:  acetaminophen  (TYLENOL ) 500 MG tablet   buPROPion  (WELLBUTRIN  XL) 150 MG 24 hr tablet   busPIRone  (BUSPAR ) 10 MG tablet   diphenhydrAMINE  (BENADRYL ) 25 mg capsule   DULoxetine  (CYMBALTA ) 60 MG capsule   gabapentin  (NEURONTIN ) 600 MG tablet   omeprazole (PRILOSEC) 40 MG capsule   ondansetron  (ZOFRAN -ODT) 4 MG disintegrating tablet   Oxycodone  HCl 10 MG TABS   polyethylene glycol (MIRALAX  / GLYCOLAX ) 17 g packet   promethazine  (PHENERGAN ) 25 MG tablet   Rimegepant Sulfate  (NURTEC) 75 MG TBDP   SUMAtriptan  (IMITREX  STATDOSE SYSTEM) 6 MG/0.5ML SOAJ   tiZANidine  (ZANAFLEX ) 4 MG tablet   topiramate  (TOPAMAX ) 50 MG tablet   No current facility-administered medications for this encounter.    Burnard CHRISTELLA Odis DEVONNA [facility] Phone [phone] 04/01/2024 10:22 AM

## 2024-04-02 ENCOUNTER — Ambulatory Visit (HOSPITAL_COMMUNITY)

## 2024-04-02 ENCOUNTER — Other Ambulatory Visit: Payer: Self-pay

## 2024-04-02 ENCOUNTER — Encounter (HOSPITAL_COMMUNITY): Payer: Self-pay | Admitting: Orthopedic Surgery

## 2024-04-02 ENCOUNTER — Observation Stay (HOSPITAL_COMMUNITY)
Admission: RE | Admit: 2024-04-02 | Discharge: 2024-04-03 | Disposition: A | Discharge status: Home / Self Care | Attending: Orthopedic Surgery | Admitting: Orthopedic Surgery

## 2024-04-02 ENCOUNTER — Ambulatory Visit (HOSPITAL_COMMUNITY): Admitting: Anesthesiology

## 2024-04-02 ENCOUNTER — Observation Stay (HOSPITAL_COMMUNITY)

## 2024-04-02 ENCOUNTER — Encounter (HOSPITAL_COMMUNITY)
Admission: RE | Disposition: A | Discharge status: Home / Self Care | Payer: Self-pay | Source: Home / Self Care | Attending: Orthopedic Surgery

## 2024-04-02 ENCOUNTER — Ambulatory Visit (HOSPITAL_COMMUNITY): Payer: Self-pay | Admitting: Physician Assistant

## 2024-04-02 DIAGNOSIS — Z96641 Presence of right artificial hip joint: Secondary | ICD-10-CM | POA: Insufficient documentation

## 2024-04-02 DIAGNOSIS — M170 Bilateral primary osteoarthritis of knee: Secondary | ICD-10-CM | POA: Insufficient documentation

## 2024-04-02 DIAGNOSIS — M1612 Unilateral primary osteoarthritis, left hip: Secondary | ICD-10-CM

## 2024-04-02 DIAGNOSIS — Z6838 Body mass index (BMI) 38.0-38.9, adult: Secondary | ICD-10-CM

## 2024-04-02 DIAGNOSIS — E66813 Obesity, class 3: Secondary | ICD-10-CM

## 2024-04-02 DIAGNOSIS — Z79899 Other long term (current) drug therapy: Secondary | ICD-10-CM | POA: Diagnosis not present

## 2024-04-02 DIAGNOSIS — F418 Other specified anxiety disorders: Secondary | ICD-10-CM | POA: Diagnosis not present

## 2024-04-02 DIAGNOSIS — Z96642 Presence of left artificial hip joint: Secondary | ICD-10-CM

## 2024-04-02 HISTORY — PX: TOTAL HIP ARTHROPLASTY: SHX124

## 2024-04-02 LAB — TYPE AND SCREEN
ABO/RH(D): O NEG
Antibody Screen: NEGATIVE

## 2024-04-02 SURGERY — ARTHROPLASTY, HIP, TOTAL, ANTERIOR APPROACH
Anesthesia: Spinal | Site: Hip | Laterality: Left

## 2024-04-02 MED ORDER — 0.9 % SODIUM CHLORIDE (POUR BTL) OPTIME
TOPICAL | Status: DC | PRN
  Administered 2024-04-02: 1000 mL

## 2024-04-02 MED ORDER — ASPIRIN 81 MG PO CHEW
81.0000 mg | CHEWABLE_TABLET | Freq: Two times a day (BID) | ORAL | Status: DC
  Administered 2024-04-02 – 2024-04-03 (×2): 81 mg via ORAL
  Filled 2024-04-02 (×2): qty 1

## 2024-04-02 MED ORDER — DEXAMETHASONE SODIUM PHOSPHATE 10 MG/ML IJ SOLN
8.0000 mg | Freq: Once | INTRAMUSCULAR | Status: AC
  Administered 2024-04-02: 10 mg via INTRAVENOUS

## 2024-04-02 MED ORDER — GABAPENTIN 300 MG PO CAPS
600.0000 mg | ORAL_CAPSULE | Freq: Three times a day (TID) | ORAL | Status: DC
  Administered 2024-04-02 – 2024-04-03 (×2): 600 mg via ORAL
  Filled 2024-04-02 (×3): qty 2

## 2024-04-02 MED ORDER — PHENYLEPHRINE HCL (PRESSORS) 10 MG/ML IV SOLN
INTRAVENOUS | Status: AC
  Filled 2024-04-02: qty 1

## 2024-04-02 MED ORDER — TRIAMCINOLONE ACETONIDE 40 MG/ML IJ SUSP
INTRAMUSCULAR | Status: DC | PRN
  Administered 2024-04-02: 4 mL

## 2024-04-02 MED ORDER — ONDANSETRON HCL 4 MG/2ML IJ SOLN
4.0000 mg | Freq: Four times a day (QID) | INTRAMUSCULAR | Status: DC | PRN
  Administered 2024-04-02: 4 mg via INTRAVENOUS
  Filled 2024-04-02: qty 2

## 2024-04-02 MED ORDER — RIMEGEPANT SULFATE 75 MG PO TBDP: 75.0000 mg | ORAL_TABLET | ORAL | Status: DC | PRN

## 2024-04-02 MED ORDER — STERILE WATER FOR IRRIGATION IR SOLN
Status: DC | PRN
  Administered 2024-04-02: 2000 mL

## 2024-04-02 MED ORDER — DULOXETINE HCL 60 MG PO CPEP
120.0000 mg | ORAL_CAPSULE | Freq: Every day | ORAL | Status: DC
  Administered 2024-04-02: 120 mg via ORAL
  Filled 2024-04-02: qty 2

## 2024-04-02 MED ORDER — ACETAMINOPHEN 500 MG PO TABS
1000.0000 mg | ORAL_TABLET | Freq: Four times a day (QID) | ORAL | Status: DC
  Administered 2024-04-02 – 2024-04-03 (×4): 1000 mg via ORAL
  Filled 2024-04-02 (×4): qty 2

## 2024-04-02 MED ORDER — SODIUM CHLORIDE 0.9 % IV SOLN: INTRAVENOUS | Status: DC

## 2024-04-02 MED ORDER — HYDROMORPHONE HCL 1 MG/ML IJ SOLN: 0.5000 mg | INTRAMUSCULAR | Status: DC | PRN

## 2024-04-02 MED ORDER — PANTOPRAZOLE SODIUM 40 MG PO TBEC
40.0000 mg | DELAYED_RELEASE_TABLET | Freq: Every day | ORAL | Status: DC
  Administered 2024-04-03: 40 mg via ORAL
  Filled 2024-04-02: qty 1

## 2024-04-02 MED ORDER — CHLORHEXIDINE GLUCONATE 0.12 % MT SOLN
15.0000 mL | Freq: Once | OROMUCOSAL | Status: AC
Start: 2024-04-02 — End: 2024-04-02
  Administered 2024-04-02: 15 mL via OROMUCOSAL

## 2024-04-02 MED ORDER — METOCLOPRAMIDE HCL 5 MG PO TABS: 5.0000 mg | ORAL_TABLET | Freq: Three times a day (TID) | ORAL | Status: DC | PRN

## 2024-04-02 MED ORDER — TRANEXAMIC ACID-NACL 1000-0.7 MG/100ML-% IV SOLN
1000.0000 mg | Freq: Once | INTRAVENOUS | Status: AC
  Administered 2024-04-02: 1000 mg via INTRAVENOUS
  Filled 2024-04-02: qty 100

## 2024-04-02 MED ORDER — HYDROMORPHONE HCL 2 MG PO TABS
2.0000 mg | ORAL_TABLET | ORAL | Status: DC | PRN
  Administered 2024-04-03: 2 mg via ORAL
  Filled 2024-04-02 (×2): qty 1

## 2024-04-02 MED ORDER — TRIAMCINOLONE ACETONIDE 40 MG/ML IJ SUSP
INTRAMUSCULAR | Status: AC
  Filled 2024-04-02: qty 2

## 2024-04-02 MED ORDER — FENTANYL CITRATE (PF) 100 MCG/2ML IJ SOLN
INTRAMUSCULAR | Status: AC
  Filled 2024-04-02: qty 2

## 2024-04-02 MED ORDER — POLYETHYLENE GLYCOL 3350 17 G PO PACK
17.0000 g | PACK | Freq: Two times a day (BID) | ORAL | Status: DC
  Administered 2024-04-03: 17 g via ORAL
  Filled 2024-04-02 (×2): qty 1

## 2024-04-02 MED ORDER — TOPIRAMATE 100 MG PO TABS
100.0000 mg | ORAL_TABLET | Freq: Every day | ORAL | Status: DC
  Administered 2024-04-03: 100 mg via ORAL
  Filled 2024-04-02: qty 1

## 2024-04-02 MED ORDER — ONDANSETRON HCL 4 MG/2ML IJ SOLN
INTRAMUSCULAR | Status: DC | PRN
  Administered 2024-04-02: 4 mg via INTRAVENOUS

## 2024-04-02 MED ORDER — EPHEDRINE SULFATE-NACL 50-0.9 MG/10ML-% IV SOSY
PREFILLED_SYRINGE | INTRAVENOUS | Status: DC | PRN
  Administered 2024-04-02: 15 mg via INTRAVENOUS
  Administered 2024-04-02 (×2): 5 mg via INTRAVENOUS

## 2024-04-02 MED ORDER — METOCLOPRAMIDE HCL 5 MG/ML IJ SOLN: 5.0000 mg | Freq: Three times a day (TID) | INTRAMUSCULAR | Status: DC | PRN

## 2024-04-02 MED ORDER — HYDROMORPHONE HCL 2 MG PO TABS
1.0000 mg | ORAL_TABLET | ORAL | Status: DC | PRN
  Administered 2024-04-02 – 2024-04-03 (×2): 2 mg via ORAL
  Filled 2024-04-02: qty 1

## 2024-04-02 MED ORDER — PROMETHAZINE HCL 25 MG PO TABS: 25.0000 mg | ORAL_TABLET | Freq: Four times a day (QID) | ORAL | Status: DC | PRN

## 2024-04-02 MED ORDER — ALUM & MAG HYDROXIDE-SIMETH 200-200-20 MG/5ML PO SUSP: 30.0000 mL | ORAL | Status: DC | PRN

## 2024-04-02 MED ORDER — BUSPIRONE HCL 10 MG PO TABS: 10.0000 mg | ORAL_TABLET | Freq: Two times a day (BID) | ORAL | Status: DC | PRN

## 2024-04-02 MED ORDER — BUPROPION HCL ER (XL) 150 MG PO TB24
150.0000 mg | ORAL_TABLET | Freq: Every day | ORAL | Status: DC
  Administered 2024-04-03: 150 mg via ORAL
  Filled 2024-04-02: qty 1

## 2024-04-02 MED ORDER — SUMATRIPTAN SUCCINATE 6 MG/0.5ML ~~LOC~~ SOLN: 6.0000 mg | SUBCUTANEOUS | Status: DC | PRN

## 2024-04-02 MED ORDER — PROPOFOL 10 MG/ML IV BOLUS
INTRAVENOUS | Status: DC | PRN
Start: 2024-04-02 — End: 2024-04-02
  Administered 2024-04-02: 100 mg via INTRAVENOUS

## 2024-04-02 MED ORDER — PROPOFOL 500 MG/50ML IV EMUL
INTRAVENOUS | Status: DC | PRN
  Administered 2024-04-02: 100 ug/kg/min via INTRAVENOUS

## 2024-04-02 MED ORDER — CEFAZOLIN SODIUM-DEXTROSE 2-4 GM/100ML-% IV SOLN
2.0000 g | Freq: Four times a day (QID) | INTRAVENOUS | Status: AC
  Administered 2024-04-02 – 2024-04-03 (×2): 2 g via INTRAVENOUS
  Filled 2024-04-02 (×2): qty 100

## 2024-04-02 MED ORDER — BUPIVACAINE-EPINEPHRINE (PF) 0.25% -1:200000 IJ SOLN
INTRAMUSCULAR | Status: AC
Start: 2024-04-02 — End: 2024-04-02
  Filled 2024-04-02: qty 30

## 2024-04-02 MED ORDER — SENNA 8.6 MG PO TABS
2.0000 | ORAL_TABLET | Freq: Every day | ORAL | Status: DC
  Administered 2024-04-02: 17.2 mg via ORAL
  Filled 2024-04-02: qty 2

## 2024-04-02 MED ORDER — BISACODYL 10 MG RE SUPP: 10.0000 mg | Freq: Every day | RECTAL | Status: DC | PRN

## 2024-04-02 MED ORDER — DEXAMETHASONE SODIUM PHOSPHATE 10 MG/ML IJ SOLN
INTRAMUSCULAR | Status: AC
Start: 2024-04-02 — End: 2024-04-02
  Filled 2024-04-02: qty 1

## 2024-04-02 MED ORDER — DEXAMETHASONE SODIUM PHOSPHATE 10 MG/ML IJ SOLN
10.0000 mg | Freq: Once | INTRAMUSCULAR | Status: AC
  Administered 2024-04-03: 10 mg via INTRAVENOUS
  Filled 2024-04-02: qty 1

## 2024-04-02 MED ORDER — HYDROMORPHONE HCL 1 MG/ML IJ SOLN: 0.2500 mg | INTRAMUSCULAR | Status: DC | PRN

## 2024-04-02 MED ORDER — DIPHENHYDRAMINE HCL 12.5 MG/5ML PO ELIX
12.5000 mg | ORAL_SOLUTION | ORAL | Status: DC | PRN
  Administered 2024-04-02: 25 mg via ORAL
  Filled 2024-04-02: qty 10

## 2024-04-02 MED ORDER — FENTANYL CITRATE (PF) 250 MCG/5ML IJ SOLN
INTRAMUSCULAR | Status: DC | PRN
  Administered 2024-04-02: 100 ug via INTRAVENOUS

## 2024-04-02 MED ORDER — TIZANIDINE HCL 4 MG PO TABS
4.0000 mg | ORAL_TABLET | Freq: Four times a day (QID) | ORAL | Status: DC | PRN
  Administered 2024-04-02 – 2024-04-03 (×2): 4 mg via ORAL
  Filled 2024-04-02 (×2): qty 1

## 2024-04-02 MED ORDER — LIDOCAINE HCL 1 % IJ SOLN
INTRAMUSCULAR | Status: AC
  Filled 2024-04-02: qty 20

## 2024-04-02 MED ORDER — ORAL CARE MOUTH RINSE: 15.0000 mL | Freq: Once | OROMUCOSAL | Status: AC

## 2024-04-02 MED ORDER — TRANEXAMIC ACID-NACL 1000-0.7 MG/100ML-% IV SOLN
1000.0000 mg | INTRAVENOUS | Status: AC
Start: 2024-04-02 — End: 2024-04-02
  Administered 2024-04-02: 1000 mg via INTRAVENOUS
  Filled 2024-04-02: qty 100

## 2024-04-02 MED ORDER — CEFAZOLIN SODIUM-DEXTROSE 2-4 GM/100ML-% IV SOLN
2.0000 g | INTRAVENOUS | Status: AC
Start: 2024-04-02 — End: 2024-04-02
  Administered 2024-04-02: 2 g via INTRAVENOUS
  Filled 2024-04-02: qty 100

## 2024-04-02 MED ORDER — LIDOCAINE HCL (PF) 2 % IJ SOLN
INTRAMUSCULAR | Status: DC | PRN
Start: 2024-04-02 — End: 2024-04-02
  Administered 2024-04-02: 100 mg via INTRADERMAL

## 2024-04-02 MED ORDER — SODIUM CHLORIDE (PF) 0.9 % IJ SOLN
INTRAMUSCULAR | Status: DC | PRN
  Administered 2024-04-02: 61 mL

## 2024-04-02 MED ORDER — MIDAZOLAM HCL 2 MG/2ML IJ SOLN
INTRAMUSCULAR | Status: DC | PRN
  Administered 2024-04-02: 2 mg via INTRAVENOUS

## 2024-04-02 MED ORDER — LACTATED RINGERS IV SOLN: INTRAVENOUS | Status: DC

## 2024-04-02 MED ORDER — SODIUM CHLORIDE (PF) 0.9 % IJ SOLN
INTRAMUSCULAR | Status: AC
  Filled 2024-04-02: qty 30

## 2024-04-02 MED ORDER — KETOROLAC TROMETHAMINE 30 MG/ML IJ SOLN
INTRAMUSCULAR | Status: AC
  Filled 2024-04-02: qty 1

## 2024-04-02 MED ORDER — MIDAZOLAM HCL 2 MG/2ML IJ SOLN
INTRAMUSCULAR | Status: AC
  Filled 2024-04-02: qty 2

## 2024-04-02 MED ORDER — ONDANSETRON HCL 4 MG PO TABS: 4.0000 mg | ORAL_TABLET | Freq: Four times a day (QID) | ORAL | Status: DC | PRN

## 2024-04-02 MED ORDER — MUPIROCIN 2 % EX OINT
1.0000 | TOPICAL_OINTMENT | Freq: Two times a day (BID) | CUTANEOUS | 0 refills | Status: DC
Start: 2024-04-02 — End: 2024-05-02

## 2024-04-02 MED ORDER — POVIDONE-IODINE 10 % EX SWAB: 2.0000 | Freq: Once | CUTANEOUS | Status: DC

## 2024-04-02 MED ORDER — PHENYLEPHRINE HCL-NACL 20-0.9 MG/250ML-% IV SOLN
INTRAVENOUS | Status: DC | PRN
  Administered 2024-04-02: 50 ug/min via INTRAVENOUS

## 2024-04-02 MED ORDER — KETOROLAC TROMETHAMINE 15 MG/ML IJ SOLN
7.5000 mg | Freq: Four times a day (QID) | INTRAMUSCULAR | Status: AC
  Administered 2024-04-02 – 2024-04-03 (×3): 7.5 mg via INTRAVENOUS
  Filled 2024-04-02 (×3): qty 1

## 2024-04-02 MED ORDER — PHENYLEPHRINE 80 MCG/ML (10ML) SYRINGE FOR IV PUSH (FOR BLOOD PRESSURE SUPPORT)
PREFILLED_SYRINGE | INTRAVENOUS | Status: DC | PRN
Start: 2024-04-02 — End: 2024-04-02
  Administered 2024-04-02: 160 ug via INTRAVENOUS

## 2024-04-02 MED ORDER — PHENOL 1.4 % MT LIQD: 1.0000 | OROMUCOSAL | Status: DC | PRN

## 2024-04-02 MED ORDER — CHLORHEXIDINE GLUCONATE 4 % EX SOLN: 1.0000 | CUTANEOUS | 1 refills | Status: DC

## 2024-04-02 MED ORDER — MIDAZOLAM HCL 2 MG/2ML IJ SOLN
2.0000 mg | Freq: Once | INTRAMUSCULAR | Status: AC
  Administered 2024-04-02: 2 mg via INTRAVENOUS

## 2024-04-02 MED ORDER — BUPIVACAINE IN DEXTROSE 0.75-8.25 % IT SOLN
INTRATHECAL | Status: DC | PRN
  Administered 2024-04-02: 1.6 mL via INTRATHECAL

## 2024-04-02 MED ORDER — MENTHOL 3 MG MT LOZG: 1.0000 | LOZENGE | OROMUCOSAL | Status: DC | PRN

## 2024-04-02 SURGICAL SUPPLY — 39 items
BAG COUNTER SPONGE SURGICOUNT (BAG) IMPLANT
BAG ZIPLOCK 12X15 (MISCELLANEOUS) ×2 IMPLANT
BLADE SAG 18X100X1.27 (BLADE) ×2 IMPLANT
COVER PERINEAL POST (MISCELLANEOUS) ×2 IMPLANT
COVER SURGICAL LIGHT HANDLE (MISCELLANEOUS) ×2 IMPLANT
CUP ACETBLR 52 OD PINNACLE (Hips) IMPLANT
DERMABOND ADVANCED .7 DNX12 (GAUZE/BANDAGES/DRESSINGS) ×2 IMPLANT
DRAPE FOOT SWITCH (DRAPES) ×2 IMPLANT
DRAPE STERI IOBAN 125X83 (DRAPES) ×2 IMPLANT
DRAPE U-SHAPE 47X51 STRL (DRAPES) ×4 IMPLANT
DRESSING AQUACEL AG SP 3.5X10 (GAUZE/BANDAGES/DRESSINGS) ×2 IMPLANT
DRSG AQUACEL AG ADV 3.5X10 (GAUZE/BANDAGES/DRESSINGS) IMPLANT
DURAPREP 26ML APPLICATOR (WOUND CARE) ×2 IMPLANT
ELECT REM PT RETURN 15FT ADLT (MISCELLANEOUS) ×2 IMPLANT
GLOVE BIO SURGEON STRL SZ 6 (GLOVE) ×2 IMPLANT
GLOVE BIOGEL PI IND STRL 6.5 (GLOVE) ×2 IMPLANT
GLOVE BIOGEL PI IND STRL 7.5 (GLOVE) ×2 IMPLANT
GLOVE ORTHO TXT STRL SZ7.5 (GLOVE) ×4 IMPLANT
GOWN STRL REUS W/ TWL LRG LVL3 (GOWN DISPOSABLE) ×4 IMPLANT
HEAD CERAMIC DELTA 36 PLUS 1.5 (Hips) IMPLANT
HOLDER FOLEY CATH W/STRAP (MISCELLANEOUS) ×2 IMPLANT
KIT TURNOVER KIT A (KITS) ×2 IMPLANT
LINER NEUTRAL 52X36MM PLUS 4 (Liner) IMPLANT
MANIFOLD NEPTUNE II (INSTRUMENTS) ×2 IMPLANT
NDL SAFETY ECLIPSE 18X1.5 (NEEDLE) ×2 IMPLANT
PACK ANTERIOR HIP CUSTOM (KITS) ×2 IMPLANT
PENCIL SMOKE EVACUATOR (MISCELLANEOUS) ×2 IMPLANT
SCREW 6.5MMX25MM (Screw) IMPLANT
STEM FEMORAL SZ6 HIGH ACTIS (Stem) IMPLANT
SUT MNCRL AB 4-0 PS2 18 (SUTURE) ×2 IMPLANT
SUT VIC AB 1 CT1 36 (SUTURE) ×6 IMPLANT
SUT VIC AB 2-0 CT1 TAPERPNT 27 (SUTURE) ×4 IMPLANT
SUTURE STRATFX 0 PDS 27 VIOLET (SUTURE) ×2 IMPLANT
SYR 3ML LL SCALE MARK (SYRINGE) ×2 IMPLANT
TOWEL GREEN STERILE FF (TOWEL DISPOSABLE) ×2 IMPLANT
TRAY FOLEY MTR SLVR 14FR STAT (SET/KITS/TRAYS/PACK) IMPLANT
TRAY FOLEY MTR SLVR 16FR STAT (SET/KITS/TRAYS/PACK) ×2 IMPLANT
TUBE SUCTION HIGH CAP CLEAR NV (SUCTIONS) ×2 IMPLANT
WATER STERILE IRR 1000ML POUR (IV SOLUTION) ×2 IMPLANT

## 2024-04-02 NOTE — Transfer of Care (Signed)
 Immediate Anesthesia Transfer of Care Note  Patient: Tina Hunt  Procedure(s) Performed: LEFT TOTAL HIP ARTHROPLASTY, ANTERIOR APPROACH AND BILATERAL KNEE INJECTIONS (Left: Hip)  Patient Location: PACU  Anesthesia Type:Spinal  Level of Consciousness: awake, alert , and oriented  Airway & Oxygen Therapy: Patient Spontanous Breathing and Patient connected to nasal cannula oxygen  Post-op Assessment: Report given to RN and Post -op Vital signs reviewed and stable  Post vital signs: Reviewed and stable  Last Vitals:  Vitals Value Taken Time  BP    Temp    Pulse 61 04/02/24 15:37  Resp 15 04/02/24 15:37  SpO2 95 % 04/02/24 15:37  Vitals shown include unfiled device data.  Last Pain:  Vitals:   04/02/24 1135  TempSrc: Oral  PainSc:          Complications: No notable events documented.

## 2024-04-02 NOTE — Anesthesia Procedure Notes (Signed)
 Spinal  Patient location during procedure: OR Start time: 04/02/2024 1:43 PM End time: 04/02/2024 1:48 PM Reason for block: surgical anesthesia Staffing Performed: anesthesiologist  Anesthesiologist: Epifanio Fallow, MD Performed by: Epifanio Fallow, MD Authorized by: Epifanio Fallow, MD   Preanesthetic Checklist Completed: patient identified, IV checked, risks and benefits discussed, surgical consent, monitors and equipment checked, pre-op evaluation and timeout performed Spinal Block Patient position: sitting Prep: DuraPrep Patient monitoring: cardiac monitor, continuous pulse ox and blood pressure Approach: midline Location: L3-4 Injection technique: single-shot Needle Needle type: Pencan  Needle gauge: 24 G Needle length: 9 cm Assessment Sensory level: T8 Events: CSF return Additional Notes Functioning IV was confirmed and monitors were applied. Sterile prep and drape, including hand hygiene and sterile gloves were used. The patient was positioned and the spine was prepped. The skin was anesthetized with lidocaine .  Free flow of clear CSF was obtained prior to injecting local anesthetic into the CSF.  The spinal needle aspirated freely following injection.  The needle was carefully withdrawn.  The patient tolerated the procedure well.

## 2024-04-02 NOTE — Interval H&P Note (Signed)
 History and Physical Interval Note:  04/02/2024 12:19 PM  Tina Hunt  has presented today for surgery, with the diagnosis of Left hip osteoarthritis.  The various methods of treatment have been discussed with the patient and family. After consideration of risks, benefits and other options for treatment, the patient has consented to  Procedure(s): ARTHROPLASTY, HIP, TOTAL, ANTERIOR APPROACH (Left) as a surgical intervention.  The patient's history has been reviewed, patient examined, no change in status, stable for surgery.  I have reviewed the patient's chart and labs.  Questions were answered to the patient's satisfaction.     [name]

## 2024-04-02 NOTE — Op Note (Signed)
 NAME:  Tina Hunt                ACCOUNT NO.: [id number]      MEDICAL RECORD NO.: [id number]      FACILITY:  [facility]      PHYSICIAN:  [name]  DATE OF BIRTH:  [birth year]     DATE OF PROCEDURE:  04/02/2024                                 OPERATIVE REPORT         PREOPERATIVE DIAGNOSIS: 1.Left  hip osteoarthritis.  2.  Bilateral knee osteoarthritis and pain     POSTOPERATIVE DIAGNOSIS: 1.  Left hip osteoarthritis.  2.  Bilateral knee osteoarthritis and pain     PROCEDURE: 1.  Left total hip replacement through an anterior approach   utilizing DePuy THR system, component size 52 mm pinnacle cup, a size 36+4 neutral   Altrex liner, a size 6 Hi Actis stem with a 36+1.5 delta ceramic   ball. 2.  Bilateral knee intra-articular injections, 40 mg of Kenalog  each knee     SURGEON:  Donnice JONETTA. Car, M.D.      ASSISTANT:  Rosina Calin, PA-C     ANESTHESIA:  Spinal.      SPECIMENS:  None.      COMPLICATIONS:  None.      BLOOD LOSS:  650 cc     DRAINS:  None.      INDICATION OF THE PROCEDURE:  Tina Hunt is a 48 y.o. female who had   presented to office for evaluation of left hip pain.  Radiographs revealed   progressive degenerative changes with bone-on-bone   articulation of the  hip joint, including subchondral cystic changes and osteophytes.  The patient had painful limited range of   motion significantly affecting their overall quality of life and function.  The patient was failing to    respond to conservative measures including medications and/or injections and activity modification and at this point was ready   to proceed with more definitive measures.  Consent was obtained for   benefit of pain relief.  Specific risks of infection, DVT, component   failure, dislocation, neurovascular injury, and need for revision surgery were reviewed in the office.  Additionally seen and evaluated in the office for bilateral knee pain.  She does  have radiographic evidence of osteoarthritis.  In office she wished to proceed with bilateral knee injections under anesthesia.       PROCEDURE IN DETAIL:  The patient was brought to operative theater.   Once adequate anesthesia, preoperative antibiotics, 2 gm of Ancef , 1 gm of Tranexamic Acid , and 10 mg of Decadron  were administered, the patient was positioned supine on the [employer] table.  Once the patient was safely positioned with adequate padding of boney prominences we predraped out the hip, and used fluoroscopy to confirm orientation of the pelvis.   When she was under appropriate anesthesia a timeout performed to define both of her knees.  Both knees were then cleaned and injected with 40 mg of Kenalog  per knee with lidocaine  in combination.  Each injection site was dressed with a Band-Aid. Once the knees were injected attention was directed to her left hip.     The left hip was then prepped and draped from proximal iliac crest to   mid thigh with a shower  curtain technique.      Time-out was performed identifying the patient, planned procedure, and the appropriate extremity.     An incision was then made 2 cm lateral to the   anterior superior iliac spine extending over the orientation of the   tensor fascia lata muscle and sharp dissection was carried down to the   fascia of the muscle.      The fascia was then incised.  The muscle belly was identified and swept   laterally and retractor placed along the superior neck.  Following   cauterization of the circumflex vessels and removing some pericapsular   fat, a second cobra retractor was placed on the inferior neck.  A T-capsulotomy was made along the line of the   superior neck to the trochanteric fossa, then extended proximally and   distally.  Tag sutures were placed and the retractors were then placed   intracapsular.  We then identified the trochanteric fossa and   orientation of my neck cut and then made a neck osteotomy with  the femur on traction.  The femoral   head was removed without difficulty or complication.  Traction was let   off and retractors were placed posterior and anterior around the   acetabulum.      The labrum and foveal tissue were debrided.  I began reaming with a 45 mm   reamer and reamed up to 51 mm reamer with good bony bed preparation and a 52 mm  cup was chosen.  The final 52 mm Pinnacle cup was then impacted under fluoroscopy to confirm the depth of penetration and orientation with respect to   Abduction and forward flexion.  A screw was placed into the ilium followed by the hole eliminator.  The final   36+4 neutral Altrex liner was impacted with good visualized rim fit.  The cup was positioned anatomically within the acetabular portion of the pelvis.      At this point, the femur was rolled to 100 degrees.  Further capsule was   released off the inferior aspect of the femoral neck.  I then   released the superior capsule proximally.  With the leg in a neutral position the hook was placed laterally   along the femur under the vastus lateralis origin and elevated manually and then held in position using the hook attachment on the bed.  The leg was then extended and adducted with the leg rolled to 100   degrees of external rotation.  Retractors were placed along the medial calcar and posteriorly over the greater trochanter.  Once the proximal femur was fully   exposed, I used a box osteotome to set orientation.  I then began   broaching with the starting chili pepper broach and passed this by hand and then broached up to 6.  With the 6 broach in place I chose a high offset neck and did several trial reductions.  The offset was appropriate, leg lengths   appeared to be equal best matched with the +1.5 head ball trial confirmed radiographically.   Given these findings, I went ahead and dislocated the hip, repositioned all   retractors and positioned the right hip in the extended and abducted  position.  The final 6 Hi Actis stem was   chosen and it was impacted down to the level of neck cut.  Based on this   and the trial reductions, a final 36+1.5 delta ceramic ball was chosen and   impacted onto a  clean and dry trunnion, and the hip was reduced.  The   hip had been irrigated throughout the case again at this point.  I did   reapproximate the superior capsular leaflet to the anterior leaflet   using #1 Vicryl.  The fascia of the   tensor fascia lata muscle was then reapproximated using #1 Vicryl and #0 Stratafix sutures.  The   remaining wound was closed with 2-0 Vicryl and running 4-0 Monocryl.   The hip was cleaned, dried, and dressed sterilely using Dermabond and   Aquacel dressing.  The patient was then brought   to recovery room in stable condition tolerating the procedure well.    Rosina Calin, PA-C was present for the entirety of the case involved from   preoperative positioning, perioperative retractor management, general   facilitation of the case, as well as primary wound closure as assistant.            Donnice CORDOBA Ernie, M.D.        04/02/2024 12:19 PM

## 2024-04-02 NOTE — Discharge Instructions (Signed)

## 2024-04-02 NOTE — Anesthesia Postprocedure Evaluation (Signed)
 Anesthesia Post Note  Patient: Tina Hunt  Procedure(s) Performed: LEFT TOTAL HIP ARTHROPLASTY, ANTERIOR APPROACH AND BILATERAL KNEE INJECTIONS (Left: Hip)     Patient location during evaluation: PACU Anesthesia Type: Spinal Level of consciousness: oriented and awake and alert Pain management: pain level controlled Vital Signs Assessment: post-procedure vital signs reviewed and stable Respiratory status: spontaneous breathing and respiratory function stable Cardiovascular status: blood pressure returned to baseline and stable Postop Assessment: no headache, no backache, no apparent nausea or vomiting, spinal receding and patient able to bend at knees Anesthetic complications: no   No notable events documented.  Last Vitals:  Vitals:   04/02/24 1702 04/02/24 1715  BP: (!) 158/100 (!) 152/91  Pulse: 82 79  Resp: 15 13  Temp:    SpO2: 98% 99%    Last Pain:  Vitals:   04/02/24 1715  TempSrc:   PainSc: 0-No pain                 Berniece Abid,W. EDMOND

## 2024-04-02 NOTE — H&P (Signed)
 TOTAL HIP ADMISSION H&P  Patient is admitted for left total hip arthroplasty.  Therapy Plans: HEP Disposition: Home with husband (son at home) Planned DVT Prophylaxis: aspirin  81mg  BID DME needed: none PCP: Dr. Loring, clearance received TXA: IV Allergies: adhesives - rash, aspirin  - abdominal pain, clarithromycin - diarrhea, PCN G - hives Anesthesia Concerns: none BMI: 40 Last HgbA1c: Not diabetic  Other: - History of right THA - did well - bilateral knee OA - wants bilateral knee injections at time of surgery ** - Aquacel was okay - Chronic pain > oxycodone  10 QID - ABX for a week - Hx of sleeve gastrectomy -- takes prilosec with NSAIDs per GI -- toradol ? - Tizanidine , hydromorphone , tylenol , ibuprofen  ~  Subjective:  Chief Complaint: left hip pain  HPI: Tina Hunt, 48 y.o. female, has a history of pain and functional disability in the left hip(s) due to arthritis and patient has failed non-surgical conservative treatments for greater than 12 weeks to include NSAID's and/or analgesics and activity modification.  Onset of symptoms was gradual starting 2 years ago with gradually worsening course since that time.The patient noted no past surgery on the left hip(s).  Patient currently rates pain in the left hip at 8 out of 10 with activity. Patient has worsening of pain with activity and weight bearing, pain that interfers with activities of daily living, and pain with passive range of motion. Patient has evidence of joint space narrowing by imaging studies. This condition presents safety issues increasing the risk of falls. There is no current active infection.  Patient Active Problem List   Diagnosis Date Noted   S/P total right hip arthroplasty 02/21/2023   Intractable migraine without aura and with status migrainosus 08/29/2019   Healthcare maintenance 05/03/2018   Anxiety 05/03/2018   Depression 05/03/2018   Acute post-operative pain 03/26/2017   Chronic migraine w/o  aura w/o status migrainosus, not intractable 12/22/2016   Migraine 12/22/2016   Acute medial meniscal tear 03/11/2014   Eustachian tube dysfunction 11/28/2011   Past Medical History:  Diagnosis Date   Anxiety    Arthritis    a little in Right knee   Chronic back pain    Depression    Endometriosis    GERD (gastroesophageal reflux disease)    Hallux varus (acquired), right foot    Headache(784.0)    MIGRAINES    History of blood transfusion 01/10/2003   [facility] - 3 units transfused   with c section   Migraine    Neck pain    clench teeth at night   Neuromuscular disorder (HCC)    BLACKOUT SPELLS  BUT NONE SINCE 1996    Pneumonia    hx of 3 years ago   PONV (postoperative nausea and vomiting)    with tonsils as child, no problems as adult   Recurrent sinusitis    Sleep apnea    Does not use CPAP  due yo weight loss   Tuberculosis 1998   TB + SKIN TEST, CXR was normal    Past Surgical History:  Procedure Laterality Date   ADENOIDECTOMY  1995   with tube placement   CESAREAN SECTION     2004    CHROMOPERTUBATION Bilateral 10/09/2015   Procedure: CHROMOPERTUBATION;  Surgeon: Jon Rummer, MD;  Location: [facility];  Service: Gynecology;  Laterality: Bilateral;   CYSTOSCOPY Bilateral 03/20/2017   Procedure: CYSTOSCOPY;  Surgeon: Rummer Jon, MD;  Location: [facility];  Service: Gynecology;  Laterality: Bilateral;   DILITATION &  CURRETTAGE/HYSTROSCOPY WITH NOVASURE ABLATION N/A 10/09/2015   Procedure: DILATATION & CURETTAGE/HYSTEROSCOPY;  Surgeon: Jon Rummer, MD;  Location: [facility];  Service: Gynecology;  Laterality: N/A;   KNEE ARTHROSCOPY Left    1988    KNEE ARTHROSCOPY Left 03/12/2014   Procedure: LEFT ARTHROSCOPY KNEE WITH DEBRIDEMENT;  Surgeon: Dempsey Melodi GAILS, MD;  Location: [facility];  Service: Orthopedics;  Laterality: Left;  medical and lateral repair menius   LAPAROSCOPIC OVARIAN CYSTECTOMY Right 10/09/2015   Procedure: LAPAROSCOPIC RIGHT OVARIAN CYSTECTOMIES, LEFT  PARATUBAL CYSTECTOMY ;  Surgeon: Jon Rummer, MD;  Location: [facility];  Service: Gynecology;  Laterality: Right;   LIGAMENT REPAIR Right 09/14/2018   Procedure: Right hallux and 2nd metatarsal phalangeal joint collateral ligament and plantar plate repair;  Surgeon: Kit Rush, MD;  Location: [facility];  Service: Orthopedics;  Laterality: Right;    SLEEVE GASTROPLASTY     TONSILLECTOMY  1983, 1985   ADENOID X2 (SEPARATE SURGERY)  TUBE PLACEMENT   TOTAL HIP ARTHROPLASTY Right 02/21/2023   Procedure: TOTAL HIP ARTHROPLASTY ANTERIOR APPROACH;  Surgeon: Ernie Cough, MD;  Location: [facility];  Service: Orthopedics;  Laterality: Right;   TOTAL LAPAROSCOPIC HYSTERECTOMY WITH SALPINGECTOMY Bilateral 03/20/2017   Procedure: HYSTERECTOMY TOTAL LAPAROSCOPIC WITH BILATERAL SALPINGECTOMY;  Surgeon: Rummer Jon, MD;  Location: [facility];  Service: Gynecology;  Laterality: Bilateral;   TYMPANOSTOMY TUBE PLACEMENT  1989, 1991, 1999, 2002, 2006, 2009, 2013    No current facility-administered medications for this encounter.   Current Outpatient Medications  Medication Sig Dispense Refill Last Dose/Taking   acetaminophen  (TYLENOL ) 500 MG tablet Take 1,000 mg by mouth every 6 (six) hours as needed for moderate pain.   Taking As Needed   buPROPion  (WELLBUTRIN  XL) 150 MG 24 hr tablet Take 150 mg by mouth daily.   Taking   busPIRone  (BUSPAR ) 10 MG tablet Take 10 mg by mouth 2 (two) times daily as needed (anxiety).   Taking As Needed   diphenhydrAMINE  (BENADRYL ) 25 mg capsule Take 25 mg by mouth every 6 (six) hours as needed for allergies.   Taking As Needed   DULoxetine  (CYMBALTA ) 60 MG capsule Take 120 mg by mouth at bedtime.   Taking   gabapentin  (NEURONTIN ) 600 MG tablet Take 600 mg by mouth 3 (three) times daily.   Taking   omeprazole (PRILOSEC) 40 MG capsule Take 40 mg by mouth daily.   Taking   ondansetron  (ZOFRAN -ODT) 4 MG disintegrating tablet DISSOLVE 1 TABLET ON TONGUE EVERY 8 HOURS  AS NEEDED. (Patient taking differently: Take 4 mg by mouth every 8 (eight) hours as needed for refractory nausea / vomiting. DISSOLVE 1 TABLET ON TONGUE EVERY 8 HOURS AS NEEDED.) 20 tablet 0 Taking Differently   Oxycodone  HCl 10 MG TABS Take 10 mg by mouth 4 (four) times daily.   Taking   polyethylene glycol (MIRALAX  / GLYCOLAX ) 17 g packet Take 17 g by mouth 2 (two) times daily. (Patient taking differently: Take 17 g by mouth daily as needed for moderate constipation.) 14 each 0 Taking Differently   promethazine  (PHENERGAN ) 25 MG tablet Take 25 mg by mouth every 6 (six) hours as needed for nausea or vomiting.   Taking As Needed   Rimegepant Sulfate  (NURTEC) 75 MG TBDP Take 1 tablet (75 mg total) by mouth as needed (Take 1 at onset of headache, max is 1 tablet in 24 hours). 36 tablet 1 Taking As Needed   SUMAtriptan  (IMITREX  STATDOSE SYSTEM) 6 MG/0.5ML SOAJ Inject 0.5 mLs into  the skin as needed (use if maxalt  fails). 18 mL 1 Taking As Needed   tiZANidine  (ZANAFLEX ) 4 MG tablet Take 4 mg by mouth every 6 (six) hours as needed for muscle spasms.   Taking As Needed   topiramate  (TOPAMAX ) 50 MG tablet Take 100 mg by mouth daily.   Taking   Allergies  Allergen Reactions   Tape Rash   Aspirin  Other (See Comments)    Stomach cramping   Tamiflu [Oseltamivir] Rash   Biaxin [Clarithromycin] Diarrhea and Nausea And Vomiting   Nsaids Other (See Comments)    Had gastric bypass, cannot take   Penicillins Hives, Nausea And Vomiting and Rash    Tolerates cephalexin .     Social History   Tobacco Use   Smoking status: Never    Passive exposure: Never   Smokeless tobacco: Never  Substance Use Topics   Alcohol use: Not Currently    Alcohol/week: 1.0 standard drink of alcohol    Types: 1 Glasses of wine per week    Comment: social    Family History  Problem Relation Age of Onset   Diabetes Father    Heart attack Father    CAD Father    Alcohol abuse Father    Stroke Mother    Diabetes Paternal  Grandfather    Heart attack Paternal Grandfather    Hypertension Paternal Grandfather    Alcohol abuse Paternal Grandfather    Lung cancer Maternal Grandfather      Review of Systems  Constitutional:  Negative for chills and fever.  Respiratory:  Negative for cough and shortness of breath.   Cardiovascular:  Negative for chest pain.  Gastrointestinal:  Negative for nausea and vomiting.  Musculoskeletal:  Positive for arthralgias.     Objective:  Physical Exam Well nourished and well developed. General: Alert and oriented x3, cooperative and pleasant, no acute distress.  Musculoskeletal: Bilateral knee exams: No palpable effusions, warmth or erythema Tenderness medial and anteriorly No significant flexion contractures with flexion to 110 degrees with tightness and discomfort anterior medial Stable medial and lateral collateral ligaments  Left hip exam: Examination of the left hip reveals a painful limited hip flexion internal rotation to 5 degrees, external rotation of 20 degrees with reproducible pain anteriorly Mild weakness due to pain with 5 -/5 strength with active hip flexion and abduction No significant lower extremity edema, erythema or calf tenderness  Vital signs in last 24 hours:    Labs:   Estimated body mass index is 38.41 kg/m as calculated from the following:   Height as of 03/28/24: 5' 2 (1.575 m).   Weight as of 03/28/24: 95.3 kg.   Imaging Review Plain radiographs demonstrate severe degenerative joint disease of the left hip(s). The bone quality appears to be adequate for age and reported activity level.      Assessment/Plan:  End stage arthritis, left hip(s)  The patient history, physical examination, clinical judgement of the provider and imaging studies are consistent with end stage degenerative joint disease of the left hip(s) and total hip arthroplasty is deemed medically necessary. The treatment options including medical management,  injection therapy, arthroscopy and arthroplasty were discussed at length. The risks and benefits of total hip arthroplasty were presented and reviewed. The risks due to aseptic loosening, infection, stiffness, dislocation/subluxation,  thromboembolic complications and other imponderables were discussed.  The patient acknowledged the explanation, agreed to proceed with the plan and consent was signed. Patient is being admitted for inpatient treatment for surgery, pain control,  PT, OT, prophylactic antibiotics, VTE prophylaxis, progressive ambulation and ADL's and discharge planning.The patient is planning to be discharged home.   Rosina Calin, PA-C Orthopedic Surgery [facility] [place] [phone]

## 2024-04-03 ENCOUNTER — Other Ambulatory Visit (HOSPITAL_COMMUNITY): Payer: Self-pay

## 2024-04-03 ENCOUNTER — Encounter (HOSPITAL_COMMUNITY): Payer: Self-pay | Admitting: Orthopedic Surgery

## 2024-04-03 DIAGNOSIS — M1612 Unilateral primary osteoarthritis, left hip: Secondary | ICD-10-CM | POA: Diagnosis not present

## 2024-04-03 LAB — CBC
HCT: 33.2 % — ABNORMAL LOW (ref 36.0–46.0)
Hemoglobin: 11.1 g/dL — ABNORMAL LOW (ref 12.0–15.0)
MCH: 35 pg — ABNORMAL HIGH (ref 26.0–34.0)
MCHC: 33.4 g/dL (ref 30.0–36.0)
MCV: 104.7 fL — ABNORMAL HIGH (ref 80.0–100.0)
Platelets: 254 K/uL (ref 150–400)
RBC: 3.17 MIL/uL — ABNORMAL LOW (ref 3.87–5.11)
RDW: 12 % (ref 11.5–15.5)
WBC: 15 K/uL — ABNORMAL HIGH (ref 4.0–10.5)
nRBC: 0 % (ref 0.0–0.2)

## 2024-04-03 LAB — BASIC METABOLIC PANEL WITH GFR
Anion gap: 12 (ref 5–15)
BUN: 15 mg/dL (ref 6–20)
CO2: 21 mmol/L — ABNORMAL LOW (ref 22–32)
Calcium: 9.1 mg/dL (ref 8.9–10.3)
Chloride: 105 mmol/L (ref 98–111)
Creatinine, Ser: 0.56 mg/dL (ref 0.44–1.00)
GFR, Estimated: >60 mL/min (ref >60)
Glucose, Bld: 140 mg/dL — ABNORMAL HIGH (ref 70–99)
Potassium: 3.8 mmol/L (ref 3.5–5.1)
Sodium: 138 mmol/L (ref 135–145)

## 2024-04-03 MED ORDER — HYDROMORPHONE HCL 2 MG PO TABS
2.0000 mg | ORAL_TABLET | ORAL | 0 refills | Status: AC | PRN
  Filled 2024-04-03: qty 42, 4d supply, fill #0

## 2024-04-03 MED ORDER — ASPIRIN 81 MG PO CHEW
81.0000 mg | CHEWABLE_TABLET | Freq: Two times a day (BID) | ORAL | 0 refills | Status: AC
  Filled 2024-04-03: qty 56, 28d supply, fill #0

## 2024-04-03 MED ORDER — MUPIROCIN 2 % EX OINT
1.0000 | TOPICAL_OINTMENT | Freq: Two times a day (BID) | CUTANEOUS | 0 refills | Status: AC
  Filled 2024-04-03: qty 66, 42d supply, fill #0

## 2024-04-03 MED ORDER — CHLORHEXIDINE GLUCONATE 4 % EX SOLN
1.0000 | CUTANEOUS | 1 refills | Status: AC
  Filled 2024-04-03: qty 946, 42d supply, fill #0

## 2024-04-03 MED ORDER — SENNA 8.6 MG PO TABS
2.0000 | ORAL_TABLET | Freq: Every day | ORAL | 0 refills | Status: AC
  Filled 2024-04-03: qty 28, 14d supply, fill #0

## 2024-04-03 MED ORDER — CEFADROXIL 500 MG PO CAPS
500.0000 mg | ORAL_CAPSULE | Freq: Two times a day (BID) | ORAL | 0 refills | Status: AC
  Filled 2024-04-03: qty 14, 7d supply, fill #0
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
## 2024-04-03 NOTE — TOC Transition Note (Addendum)
 Transition of Care [facility]) - Discharge Note   Patient Details  Name: Tina Hunt MRN: [id number] Date of Birth: Jan 15, 1976  [facility] CM/SW Contact:  Alfonse JONELLE Rex, RN Phone Number: 04/03/2024, 10:31 AM   Clinical Narrative:   Met with patient at bedside to review dc therapy and home equipment needs, patient confirmed HEP, has RW, no home DME needs. SDOH for Transportation, Utilities concerns, Resources added to AVS. No further TOC needs.     Final next level of care: Home/Self Care     Patient Goals and CMS Choice            Discharge Placement                       Discharge Plan and Services Additional resources added to the After Visit Summary for                                       Social Drivers of Health (SDOH) Interventions SDOH Screenings   Food Insecurity: Food Insecurity Present (04/02/2024)  Housing: Low Risk  (04/02/2024)  Recent Concern: Housing - High Risk (03/01/2024)   Received from [facility]  Transportation Needs: No Transportation Needs (04/02/2024)  Recent Concern: Transportation Needs - Unmet Transportation Needs (03/01/2024)   Received from [facility]  Utilities: Not At Risk (04/02/2024)  Recent Concern: Utilities - At Risk (03/01/2024)   Received from [facility]  Depression (PHQ2-9): Medium Risk (07/20/2019)  Financial Resource Strain: Medium Risk (03/01/2024)   Received from [facility]  Physical Activity: Unknown (03/01/2024)   Received from [facility]  Social Connections: Socially Isolated (03/01/2024)   Received from [facility]  Stress: Stress Concern Present (03/01/2024)   Received from [facility]  Tobacco Use: Low Risk  (04/02/2024)     Readmission Risk Interventions     No data to display
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
## 2024-04-03 NOTE — Progress Notes (Signed)
   Subjective: 1 Day Post-Op Procedure(s) (LRB): LEFT TOTAL HIP ARTHROPLASTY, ANTERIOR APPROACH AND BILATERAL KNEE INJECTIONS (Left) Patient reports pain as mild.   Patient seen in rounds for Dr. Ernie. Patient is resting in bed on exam this morning. No acute events overnight. Foley catheter removed. Patient has not been up with PT yet. She reports bilateral knee pain.  We will start therapy today.   Objective: Vital signs in last 24 hours: Temp:  [97.5 F (36.4 C)-99.2 F (37.3 C)] 98.4 F (36.9 C) (07/09 0533) Pulse Rate:  [48-99] 78 (07/09 0533) Resp:  [11-24] 18 (07/09 0533) BP: (120-163)/(86-107) 139/87 (07/09 0533) SpO2:  [93 %-100 %] 100 % (07/09 0533) Weight:  [95.3 kg] 95.3 kg (07/08 1133)  Intake/Output from previous day:  Intake/Output Summary (Last 24 hours) at 04/03/2024 0810 Last data filed at 04/03/2024 0600 Gross per 24 hour  Intake 2420 ml  Output 2700 ml  Net -280 ml     Intake/Output this shift: No intake/output data recorded.  Labs: Recent Labs    04/03/24 0347  HGB 11.1*   Recent Labs    04/03/24 0347  WBC 15.0*  RBC 3.17*  HCT 33.2*  PLT 254   Recent Labs    04/03/24 0347  NA 138  K 3.8  CL 105  CO2 21*  BUN 15  CREATININE 0.56  GLUCOSE 140*  CALCIUM 9.1   No results for input(s): LABPT, INR in the last 72 hours.  Exam: General - Patient is Alert and Oriented Extremity - Neurologically intact Sensation intact distally Intact pulses distally Dorsiflexion/Plantar flexion intact Dressing - dressing C/D/I Motor Function - intact, moving foot and toes well on exam.   Past Medical History:  Diagnosis Date   Anxiety    Arthritis    a little in Right knee   Chronic back pain    Depression    Endometriosis    GERD (gastroesophageal reflux disease)    Hallux varus (acquired), right foot    Headache(784.0)    MIGRAINES    History of blood transfusion 01/10/2003   [facility] - 3 units transfused   with c section   Migraine     Neck pain    clench teeth at night   Neuromuscular disorder (HCC)    BLACKOUT SPELLS  BUT NONE SINCE 1996    Pneumonia    hx of 3 years ago   PONV (postoperative nausea and vomiting)    with tonsils as child, no problems as adult   Recurrent sinusitis    Sleep apnea    Does not use CPAP  due yo weight loss   Tuberculosis 1998   TB + SKIN TEST, CXR was normal    Assessment/Plan: 1 Day Post-Op Procedure(s) (LRB): LEFT TOTAL HIP ARTHROPLASTY, ANTERIOR APPROACH AND BILATERAL KNEE INJECTIONS (Left) Principal Problem:   S/P total left hip arthroplasty  Estimated body mass index is 38.41 kg/m as calculated from the following:   Height as of this encounter: 5' 2 (1.575 m).   Weight as of this encounter: 95.3 kg. Advance diet Up with therapy D/C IV fluids  DVT Prophylaxis - Aspirin  Weight bearing as tolerated.  Hgb stable at 11.1 this AM  Plan is to go Home after hospital stay. Plan for discharge today after meeting goals with therapy. Follow up in the office in 2 weeks.   Rosina Calin, PA-C Orthopedic Surgery [phone] 04/03/2024, 8:10 AM

## 2024-04-03 NOTE — Progress Notes (Signed)
 Discharge mediations delivered to patient at bedside [facility]

## 2024-04-03 NOTE — Progress Notes (Signed)
 Discharge instructions given to patient questions asked and answered. Sylvia Everts RN

## 2024-04-03 NOTE — Plan of Care (Signed)
  Problem: Activity: Goal: Ability to avoid complications of mobility impairment will improve Outcome: Progressing Goal: Ability to tolerate increased activity will improve Outcome: Progressing   Problem: Clinical Measurements: Goal: Postoperative complications will be avoided or minimized Outcome: Progressing   Problem: Pain Management: Goal: Pain level will decrease with appropriate interventions Outcome: Progressing   

## 2024-04-11 NOTE — Discharge Summary (Signed)
 Patient ID: Tina Hunt MRN: [id number] DOB/AGE: 1976/05/21 48 y.o.  Admit date: 04/02/2024 Discharge date: 04/03/2024  Admission Diagnoses:  Left hip osteoarthritis  Discharge Diagnoses:  Principal Problem:   S/P total left hip arthroplasty   Past Medical History:  Diagnosis Date   Anxiety    Arthritis    a little in Right knee   Chronic back pain    Depression    Endometriosis    GERD (gastroesophageal reflux disease)    Hallux varus (acquired), right foot    Headache(784.0)    MIGRAINES    History of blood transfusion 01/10/2003   [facility] - 3 units transfused   with c section   Migraine    Neck pain    clench teeth at night   Neuromuscular disorder (HCC)    BLACKOUT SPELLS  BUT NONE SINCE 1996    Pneumonia    hx of 3 years ago   PONV (postoperative nausea and vomiting)    with tonsils as child, no problems as adult   Recurrent sinusitis    Sleep apnea    Does not use CPAP  due yo weight loss   Tuberculosis 1998   TB + SKIN TEST, CXR was normal    Surgeries: Procedure(s): LEFT TOTAL HIP ARTHROPLASTY, ANTERIOR APPROACH AND BILATERAL KNEE INJECTIONS on 04/02/2024   Consultants:   Discharged Condition: Improved  Hospital Course: Tina Hunt is an 48 y.o. female who was admitted 04/02/2024 for operative treatment ofS/P total left hip arthroplasty. Patient has severe unremitting pain that affects sleep, daily activities, and work/hobbies. After pre-op clearance the patient was taken to the operating room on 04/02/2024 and underwent  Procedure(s): LEFT TOTAL HIP ARTHROPLASTY, ANTERIOR APPROACH AND BILATERAL KNEE INJECTIONS.    Patient was given perioperative antibiotics:  Anti-infectives (From admission, onward)    Start     Dose/Rate Route Frequency Ordered Stop   04/03/24 0000  cefadroxil  (DURICEF) 500 MG capsule        500 mg Oral 2 times daily 04/03/24 0814 04/10/24 2359   04/02/24 2000  ceFAZolin  (ANCEF ) IVPB 2g/100 mL premix        2 g 200 mL/hr over 30  Minutes Intravenous Every 6 hours 04/02/24 1801 04/03/24 0139   04/02/24 1130  ceFAZolin  (ANCEF ) IVPB 2g/100 mL premix        2 g 200 mL/hr over 30 Minutes Intravenous On call to O.R. 04/02/24 1124 04/02/24 1355        Patient was given sequential compression devices, early ambulation, and chemoprophylaxis to prevent DVT. Patient worked with PT and was meeting their goals regarding safe ambulation and transfers.  Patient benefited maximally from hospital stay and there were no complications.    Recent vital signs: No data found.   Recent laboratory studies: No results for input(s): WBC, HGB, HCT, PLT, NA, K, CL, CO2, BUN, CREATININE, GLUCOSE, INR, CALCIUM in the last 72 hours.  Invalid input(s): PT, 2   Discharge Medications:   Allergies as of 04/03/2024       Reactions   Tape Rash   Aspirin  Other (See Comments)   Stomach cramping   Tamiflu [oseltamivir] Rash   Biaxin [clarithromycin] Diarrhea, Nausea And Vomiting   Nsaids Other (See Comments)   Had gastric bypass, cannot take   Penicillins Hives, Nausea And Vomiting, Rash   Tolerates cephalexin .        Medication List     STOP taking these medications    Oxycodone  HCl 10 MG Tabs  TAKE these medications    acetaminophen  500 MG tablet Commonly known as: TYLENOL  Take 1,000 mg by mouth every 6 (six) hours as needed for moderate pain.   Aspirin  Low Dose 81 MG chewable tablet Generic drug: aspirin  Chew 1 tablet (81 mg total) by mouth 2 (two) times daily for 28 days.   Betasept  Surgical Scrub 4 % external liquid Generic drug: chlorhexidine  Apply 15 mLs (1 Application total) topically as directed daily for 5 days every other week for 6 weeks as directed.   buPROPion  150 MG 24 hr tablet Commonly known as: WELLBUTRIN  XL Take 150 mg by mouth daily.   busPIRone  10 MG tablet Commonly known as: BUSPAR  Take 10 mg by mouth 2 (two) times daily as needed (anxiety).   diphenhydrAMINE   25 mg capsule Commonly known as: BENADRYL  Take 25 mg by mouth every 6 (six) hours as needed for allergies.   DULoxetine  60 MG capsule Commonly known as: CYMBALTA  Take 120 mg by mouth at bedtime.   gabapentin  600 MG tablet Commonly known as: NEURONTIN  Take 600 mg by mouth 3 (three) times daily.   HYDROmorphone  2 MG tablet Commonly known as: DILAUDID  Take 1-2 tablets (2-4 mg total) by mouth every 4 (four) hours as needed for severe pain (pain score 7-10) (pain score 7-10).   mupirocin  ointment 2 % Commonly known as: BACTROBAN  Place 1 Application into the nose 2 (two) times daily for 60 doses. Use as directed 2 times daily for 5 days every other week for 6 weeks.   Nurtec 75 MG Tbdp Generic drug: Rimegepant Sulfate  Take 1 tablet (75 mg total) by mouth as needed (Take 1 at onset of headache, max is 1 tablet in 24 hours).   omeprazole 40 MG capsule Commonly known as: PRILOSEC Take 40 mg by mouth daily.   ondansetron  4 MG disintegrating tablet Commonly known as: ZOFRAN -ODT DISSOLVE 1 TABLET ON TONGUE EVERY 8 HOURS AS NEEDED. What changed: See the new instructions.   polyethylene glycol 17 g packet Commonly known as: MIRALAX  / GLYCOLAX  Take 17 g by mouth 2 (two) times daily. What changed:  when to take this reasons to take this   promethazine  25 MG tablet Commonly known as: PHENERGAN  Take 25 mg by mouth every 6 (six) hours as needed for nausea or vomiting.   senna 8.6 MG Tabs tablet Commonly known as: SENOKOT Take 2 tablets (17.2 mg total) by mouth at bedtime for 14 days.   SUMAtriptan  6 MG/0.5ML Soaj Commonly known as: Imitrex  STATdose System Inject 0.5 mLs into the skin as needed (use if maxalt  fails).   tiZANidine  4 MG tablet Commonly known as: ZANAFLEX  Take 4 mg by mouth every 6 (six) hours as needed for muscle spasms.   topiramate  50 MG tablet Commonly known as: TOPAMAX  Take 100 mg by mouth daily.       ASK your doctor about these medications     cefadroxil  500 MG capsule Commonly known as: DURICEF Take 1 capsule (500 mg total) by mouth 2 (two) times daily for 7 days. Ask about: Should I take this medication?               Discharge Care Instructions  (From admission, onward)           Start     Ordered   04/03/24 0000  Change dressing       Comments: Maintain surgical dressing until follow up in the clinic. If the edges start to pull up, may reinforce with tape. If  the dressing is no longer working, may remove and cover with gauze and tape, but must keep the area dry and clean.  Call with any questions or concerns.   04/03/24 0814            Diagnostic Studies: DG Pelvis Portable Result Date: 04/02/2024 CLINICAL DATA:  [id number] S/P total hip arthroplasty [id number]. EXAM: PORTABLE PELVIS 1-2 VIEWS COMPARISON:  02/21/2023. FINDINGS: Pelvis is intact with normal and symmetric sacroiliac joints. No acute fracture or dislocation. No aggressive osseous lesion. Visualized sacral arcuate lines are unremarkable. Unremarkable symphysis pubis. Baseline examination status post left total hip arthroplasty The hardware is intact. No periprosthetic fracture or lucency. There is near anatomic alignment. Redemonstration of right total hip arthroplasty The hardware is intact. No periprosthetic fracture or lucency. No interval change in alignment, when compared to the available recent prior examination. No radiopaque foreign bodies. IMPRESSION: *Baseline examination status post left total hip arthroplasty. No acute fracture, dislocation or hardware failure. Electronically Signed   By: Ree Molt M.D.   On: 04/02/2024 16:28   DG HIP UNILAT WITH PELVIS 2-3 VIEWS LEFT Result Date: 04/02/2024 CLINICAL DATA:  Elective surgery. EXAM: DG HIP (WITH OR WITHOUT PELVIS) 2-3V LEFT COMPARISON:  None Available. FINDINGS: Two fluoroscopic spot views of the pelvis and left hip obtained in the operating room. Images during hip arthroplasty. Fluoroscopy time  10 seconds. Dose 2.29 mGy. Previous right hip arthroplasty. IMPRESSION: Intraoperative fluoroscopy during left hip arthroplasty. Electronically Signed   By: Andrea Gasman M.D.   On: 04/02/2024 15:18   DG C-Arm 1-60 Min-No Report Result Date: 04/02/2024 Fluoroscopy was utilized by the requesting physician.  No radiographic interpretation.   DG C-Arm 1-60 Min-No Report Result Date: 04/02/2024 Fluoroscopy was utilized by the requesting physician.  No radiographic interpretation.    Disposition: Discharge disposition: 01-Home or Self Care       Discharge Instructions     Call MD / Call 911   Complete by: As directed    If you experience chest pain or shortness of breath, CALL 911 and be transported to the hospital emergency room.  If you develope a fever above 101 F, pus (white drainage) or increased drainage or redness at the wound, or calf pain, call your surgeon's office.   Change dressing   Complete by: As directed    Maintain surgical dressing until follow up in the clinic. If the edges start to pull up, may reinforce with tape. If the dressing is no longer working, may remove and cover with gauze and tape, but must keep the area dry and clean.  Call with any questions or concerns.   Constipation Prevention   Complete by: As directed    Drink plenty of fluids.  Prune juice may be helpful.  You may use a stool softener, such as Colace (over the counter) 100 mg twice a day.  Use MiraLax  (over the counter) for constipation as needed.   Diet - low sodium heart healthy   Complete by: As directed    Increase activity slowly as tolerated   Complete by: As directed    Weight bearing as tolerated with assist device (walker, cane, etc) as directed, use it as long as suggested by your surgeon or therapist, typically at least 4-6 weeks.   Post-operative opioid taper instructions:   Complete by: As directed    POST-OPERATIVE OPIOID TAPER INSTRUCTIONS: It is important to wean off of your  opioid medication as soon as possible. If you do not  need pain medication after your surgery it is ok to stop day one. Opioids include: Codeine, Hydrocodone (Norco, Vicodin), Oxycodone (Percocet, oxycontin ) and hydromorphone  amongst others.  Long term and even short term use of opiods can cause: Increased pain response Dependence Constipation Depression Respiratory depression And more.  Withdrawal symptoms can include Flu like symptoms Nausea, vomiting And more Techniques to manage these symptoms Hydrate well Eat regular healthy meals Stay active Use relaxation techniques(deep breathing, meditating, yoga) Do Not substitute Alcohol to help with tapering If you have been on opioids for less than two weeks and do not have pain than it is ok to stop all together.  Plan to wean off of opioids This plan should start within one week post op of your joint replacement. Maintain the same interval or time between taking each dose and first decrease the dose.  Cut the total daily intake of opioids by one tablet each day Next start to increase the time between doses. The last dose that should be eliminated is the evening dose.      TED hose   Complete by: As directed    Use stockings (TED hose) for 2 weeks on both leg(s).  You may remove them at night for sleeping.        Follow-up Information     Ernie Cough, MD. Schedule an appointment as soon as possible for a visit in 2 week(s).   Specialty: Orthopedic Surgery Contact information: [address] [place] [address] [place] [address] [phone]                  Signed: Rosina JONELLE Calin 04/11/2024, 7:16 AM

## 2024-09-13 ENCOUNTER — Emergency Department (HOSPITAL_COMMUNITY)

## 2024-09-13 ENCOUNTER — Encounter (HOSPITAL_COMMUNITY): Payer: Self-pay | Admitting: Emergency Medicine

## 2024-09-13 ENCOUNTER — Emergency Department (HOSPITAL_COMMUNITY)
Admission: EM | Admit: 2024-09-13 | Discharge: 2024-09-14 | Disposition: A | Discharge status: Home / Self Care | Attending: Emergency Medicine | Admitting: Emergency Medicine

## 2024-09-13 ENCOUNTER — Other Ambulatory Visit: Payer: Self-pay

## 2024-09-13 DIAGNOSIS — M25571 Pain in right ankle and joints of right foot: Secondary | ICD-10-CM | POA: Insufficient documentation

## 2024-09-13 DIAGNOSIS — S80811A Abrasion, right lower leg, initial encounter: Secondary | ICD-10-CM | POA: Diagnosis not present

## 2024-09-13 DIAGNOSIS — S0990XA Unspecified injury of head, initial encounter: Secondary | ICD-10-CM | POA: Diagnosis present

## 2024-09-13 DIAGNOSIS — S0083XA Contusion of other part of head, initial encounter: Secondary | ICD-10-CM | POA: Diagnosis not present

## 2024-09-13 DIAGNOSIS — Y904 Blood alcohol level of 80-99 mg/100 ml: Secondary | ICD-10-CM | POA: Insufficient documentation

## 2024-09-13 DIAGNOSIS — Y92410 Unspecified street and highway as the place of occurrence of the external cause: Secondary | ICD-10-CM | POA: Diagnosis not present

## 2024-09-13 DIAGNOSIS — M542 Cervicalgia: Secondary | ICD-10-CM | POA: Insufficient documentation

## 2024-09-13 DIAGNOSIS — R109 Unspecified abdominal pain: Secondary | ICD-10-CM | POA: Insufficient documentation

## 2024-09-13 DIAGNOSIS — M25552 Pain in left hip: Secondary | ICD-10-CM | POA: Insufficient documentation

## 2024-09-13 LAB — BASIC METABOLIC PANEL WITH GFR
Anion gap: 13 (ref 5–15)
BUN: 12 mg/dL (ref 6–20)
CO2: 23 mmol/L (ref 22–32)
Calcium: 9.6 mg/dL (ref 8.9–10.3)
Chloride: 107 mmol/L (ref 98–111)
Creatinine, Ser: 0.64 mg/dL (ref 0.44–1.00)
GFR, Estimated: >60 mL/min
Glucose, Bld: 105 mg/dL — ABNORMAL HIGH (ref 70–99)
Potassium: 3.5 mmol/L (ref 3.5–5.1)
Sodium: 143 mmol/L (ref 135–145)

## 2024-09-13 LAB — CBC
HCT: 35 % — ABNORMAL LOW (ref 36.0–46.0)
Hemoglobin: 11.4 g/dL — ABNORMAL LOW (ref 12.0–15.0)
MCH: 31.7 pg (ref 26.0–34.0)
MCHC: 32.6 g/dL (ref 30.0–36.0)
MCV: 97.2 fL (ref 80.0–100.0)
Platelets: 332 K/uL (ref 150–400)
RBC: 3.6 MIL/uL — ABNORMAL LOW (ref 3.87–5.11)
RDW: 12.4 % (ref 11.5–15.5)
WBC: 14.5 K/uL — ABNORMAL HIGH (ref 4.0–10.5)
nRBC: 0 % (ref 0.0–0.2)

## 2024-09-13 LAB — ETHANOL: Alcohol, Ethyl (B): 80 mg/dL — ABNORMAL HIGH

## 2024-09-13 MED ORDER — IOHEXOL 300 MG/ML  SOLN
100.0000 mL | Freq: Once | INTRAMUSCULAR | Status: AC | PRN
  Administered 2024-09-13: 100 mL via INTRAVENOUS

## 2024-09-13 NOTE — Discharge Instructions (Addendum)
 The X-ray of your right ankle shows punctate densities of the distal fibular tip that are favored to be chronic in nature, however these may also be representative of an avulsion fracture.  I provided you with the contact information for an orthopedic specialist, please follow-up with their office to discuss this further.

## 2024-09-13 NOTE — ED Notes (Signed)
 RN to room, pt has removed c-collar, pt educated on the need, C-collar put back on pt, pt again has adjust c-collar.
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
## 2024-09-14 MED ORDER — ONDANSETRON 4 MG PO TBDP
4.0000 mg | ORAL_TABLET | Freq: Once | ORAL | Status: AC
  Administered 2024-09-14: 4 mg via ORAL
  Filled 2024-09-14: qty 1

## 2024-09-14 MED ORDER — ONDANSETRON 4 MG PO TBDP: 4.0000 mg | ORAL_TABLET | Freq: Three times a day (TID) | ORAL | 0 refills | Status: AC | PRN
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
